# Patient Record
Sex: Female | Born: 1980 | Race: White | Hispanic: No | Smoking: Never smoker
Health system: Southern US, Community
[De-identification: ages and names within clinical notes are randomized; demographics above are authoritative.]

## PROBLEM LIST (undated history)

## (undated) DIAGNOSIS — K52831 Collagenous colitis: Secondary | ICD-10-CM

## (undated) DIAGNOSIS — K589 Irritable bowel syndrome without diarrhea: Secondary | ICD-10-CM

## (undated) DIAGNOSIS — F32A Depression, unspecified: Secondary | ICD-10-CM

## (undated) DIAGNOSIS — A389 Scarlet fever, uncomplicated: Secondary | ICD-10-CM

## (undated) DIAGNOSIS — G039 Meningitis, unspecified: Secondary | ICD-10-CM

## (undated) DIAGNOSIS — B029 Zoster without complications: Secondary | ICD-10-CM

## (undated) DIAGNOSIS — E039 Hypothyroidism, unspecified: Secondary | ICD-10-CM

## (undated) DIAGNOSIS — J309 Allergic rhinitis, unspecified: Secondary | ICD-10-CM

## (undated) DIAGNOSIS — K529 Noninfective gastroenteritis and colitis, unspecified: Secondary | ICD-10-CM

## (undated) DIAGNOSIS — N200 Calculus of kidney: Secondary | ICD-10-CM

## (undated) DIAGNOSIS — R011 Cardiac murmur, unspecified: Secondary | ICD-10-CM

## (undated) DIAGNOSIS — J45909 Unspecified asthma, uncomplicated: Secondary | ICD-10-CM

## (undated) DIAGNOSIS — F329 Major depressive disorder, single episode, unspecified: Secondary | ICD-10-CM

## (undated) DIAGNOSIS — F419 Anxiety disorder, unspecified: Secondary | ICD-10-CM

## (undated) HISTORY — PX: WISDOM TOOTH EXTRACTION: SHX21

## (undated) HISTORY — DX: Meningitis, unspecified: G03.9

## (undated) HISTORY — PX: LITHOTRIPSY: SUR834

## (undated) HISTORY — DX: Unspecified asthma, uncomplicated: J45.909

## (undated) HISTORY — DX: Allergic rhinitis, unspecified: J30.9

## (undated) HISTORY — DX: Hypothyroidism, unspecified: E03.9

## (undated) HISTORY — DX: Cardiac murmur, unspecified: R01.1

## (undated) HISTORY — DX: Calculus of kidney: N20.0

## (undated) HISTORY — DX: Irritable bowel syndrome, unspecified: K58.9

## (undated) HISTORY — DX: Zoster without complications: B02.9

## (undated) HISTORY — DX: Major depressive disorder, single episode, unspecified: F32.9

## (undated) HISTORY — PX: TONSILLECTOMY: SUR1361

## (undated) HISTORY — DX: Depression, unspecified: F32.A

## (undated) HISTORY — DX: Anxiety disorder, unspecified: F41.9

## (undated) HISTORY — DX: Collagenous colitis: K52.831

## (undated) HISTORY — DX: Scarlet fever, uncomplicated: A38.9

## (undated) HISTORY — PX: COLONOSCOPY: SHX174

---

## 2002-04-30 ENCOUNTER — Ambulatory Visit (HOSPITAL_COMMUNITY): Admission: RE | Admit: 2002-04-30 | Discharge: 2002-04-30 | Payer: Self-pay | Admitting: Obstetrics and Gynecology

## 2002-04-30 ENCOUNTER — Encounter: Payer: Self-pay | Admitting: Obstetrics and Gynecology

## 2002-05-26 ENCOUNTER — Encounter: Payer: Self-pay | Admitting: Obstetrics and Gynecology

## 2002-05-26 ENCOUNTER — Ambulatory Visit (HOSPITAL_COMMUNITY): Admission: RE | Admit: 2002-05-26 | Discharge: 2002-05-26 | Payer: Self-pay | Admitting: Obstetrics and Gynecology

## 2002-08-02 ENCOUNTER — Inpatient Hospital Stay (HOSPITAL_COMMUNITY): Admission: AD | Admit: 2002-08-02 | Discharge: 2002-08-02 | Payer: Self-pay | Admitting: Obstetrics and Gynecology

## 2002-08-26 ENCOUNTER — Observation Stay (HOSPITAL_COMMUNITY): Admission: AD | Admit: 2002-08-26 | Discharge: 2002-08-27 | Payer: Self-pay | Admitting: Obstetrics and Gynecology

## 2002-08-27 ENCOUNTER — Encounter: Payer: Self-pay | Admitting: Obstetrics and Gynecology

## 2002-09-22 ENCOUNTER — Inpatient Hospital Stay (HOSPITAL_COMMUNITY): Admission: AD | Admit: 2002-09-22 | Discharge: 2002-09-22 | Payer: Self-pay | Admitting: Obstetrics and Gynecology

## 2002-09-26 ENCOUNTER — Inpatient Hospital Stay (HOSPITAL_COMMUNITY): Admission: AD | Admit: 2002-09-26 | Discharge: 2002-09-28 | Payer: Self-pay | Admitting: Obstetrics and Gynecology

## 2004-11-08 ENCOUNTER — Other Ambulatory Visit: Admission: RE | Admit: 2004-11-08 | Discharge: 2004-11-08 | Payer: Self-pay | Admitting: Obstetrics and Gynecology

## 2005-03-21 ENCOUNTER — Ambulatory Visit: Payer: Self-pay | Admitting: Gastroenterology

## 2005-09-08 ENCOUNTER — Inpatient Hospital Stay: Payer: Self-pay | Admitting: Internal Medicine

## 2005-09-12 ENCOUNTER — Emergency Department: Payer: Self-pay | Admitting: Emergency Medicine

## 2006-01-16 ENCOUNTER — Other Ambulatory Visit: Admission: RE | Admit: 2006-01-16 | Discharge: 2006-01-16 | Payer: Self-pay | Admitting: Obstetrics and Gynecology

## 2006-10-23 ENCOUNTER — Ambulatory Visit: Payer: Self-pay | Admitting: Gastroenterology

## 2006-10-25 ENCOUNTER — Ambulatory Visit: Payer: Self-pay | Admitting: Gastroenterology

## 2007-12-09 ENCOUNTER — Emergency Department: Payer: Self-pay | Admitting: Emergency Medicine

## 2007-12-10 ENCOUNTER — Ambulatory Visit: Payer: Self-pay | Admitting: Emergency Medicine

## 2008-02-01 ENCOUNTER — Ambulatory Visit: Payer: Self-pay | Admitting: Internal Medicine

## 2008-08-05 ENCOUNTER — Ambulatory Visit: Payer: Self-pay | Admitting: Urology

## 2009-06-23 ENCOUNTER — Ambulatory Visit: Payer: Self-pay | Admitting: Urology

## 2009-06-24 ENCOUNTER — Ambulatory Visit: Payer: Self-pay | Admitting: Urology

## 2009-06-26 ENCOUNTER — Emergency Department: Payer: Self-pay | Admitting: Emergency Medicine

## 2010-08-30 ENCOUNTER — Emergency Department: Payer: Self-pay | Admitting: Emergency Medicine

## 2010-09-19 ENCOUNTER — Ambulatory Visit (HOSPITAL_COMMUNITY): Admission: RE | Admit: 2010-09-19 | Discharge: 2010-09-19 | Payer: Self-pay | Admitting: Urology

## 2011-01-24 LAB — PREGNANCY, URINE: Preg Test, Ur: NEGATIVE

## 2011-03-31 NOTE — Discharge Summary (Signed)
NAMETASHYRA, ADDUCI                         ACCOUNT NO.:  [id number]   MEDICAL RECORD NO.:  [id number]                   PATIENT TYPE:  INP   LOCATION:  9112                                 FACILITY:  [facility]   PHYSICIAN:  Malachi Pro. Ambrose Mantle, M.D.              DATE OF BIRTH:  1981-10-23   DATE OF ADMISSION:  09/26/2002  DATE OF DISCHARGE:  09/28/2002                                 DISCHARGE SUMMARY   HISTORY OF PRESENT ILLNESS:  A 30 year old white married female, para 0-0-2-  0, gravida 3, last period possibly December 19, 2001, [facility] September 22, 2002,  by ultrasound, admitted for induction of labor.  Blood group and type A  positive with a negative antibody, nonreactive serology, rubella immune,  hepatitis B surface antigen negative, HIV negative, GC and Chlamydia  negative.  Triple screen normal.  Group B strep negative.  One hour Glucola  124.   Prenatal course began in [place]. Multiple first trimester ultrasounds  for spotting were done.  Followup ultrasound showed normal growth.  The  patient had another episode of vaginal bleeding on August 02, 2002.  At  36 weeks, the patient was admitted with a kidney stone.  On September 22, 2002, the patient claimed she was miserable so was scheduled for induction  of labor.   The patient had ultrasounds at seven weeks and nine weeks that gave a due  date of September 22, 2002.   ALLERGIES:  SULFA.   PAST MEDICAL HISTORY:  Asthma.   PAST SURGICAL HISTORY:  T&A.  Early abortions 1997 and 1999.   FAMILY HISTORY:  Negative.   SOCIAL HISTORY:  Alcohol, tobacco and drugs none.   PHYSICAL EXAMINATION:  Physical exam on admission revealed normal vital  signs.  The abdomen was soft and nontender.  Fundal height had been 38 cm on  September 22, 2002.  Fetal heart tones were normal.  There were no  decelerations.  Cervix was fingertip, 60%, vertex at a -2.   HOSPITAL COURSE:  The patient was placed on Pitocin.  By 1:28 p.m. the  cervix was 4 cm, 80-90% and vertex at a -2.  Fetal heart rate 100-105.  There were no significant declarations.  Fetal heart rate increased to 130  with scalp stimulation.  The patient received an epidural and by 5:55 p.m.  the cervix was 9+ cm, vertex at a 0 station.  She became fully dilated at 7  p.m. She gradually brought the vertex to close to a +3 station in a ROT to  ROA position by 9 p.m.  After two hours and 40 minutes of pushing and vertex  ROT to ROA an epidural boost was given.  Vertex was manually rotated to ROA  and forceps were applied by Dr. Ambrose Mantle.  A living female infant, 7 pounds 5  ounces with Apgars of 7 at one at 8 at five minutes was  delivered ROA over a  left medial and lateral episiotomy.  There was one loop of nuchal cord.  Mild shoulder dystocia was managed by McRoberts maneuver, wood screw  maneuver and suprapubic pressure.  The placenta was intact.  Uterus normal.  Rectal negative.  Left medial lateral episiotomy was repaired with 2-0 and 3-  0 Vicryl.  Blood loss about 500 cc.  Postpartum the patient did well and was  discharged on the second postpartum day.   LABORATORY DATA:  Hemoglobin on admission was 10.9, hematocrit 31.2, white  count 11,300, platelet count 184,000.  Followup hemoglobin 8.6, hematocrit  25.3, white count 12,700.  Platelet count 175,000.  The indices were normal.  RPR was nonreactive.   FINAL DIAGNOSES:  1. Intrauterine pregnancy at 40+ weeks, delivered right occipitoanterior.  2. Prolonged second stage of labor.   OPERATION:  Low forceps delivery ROA, left medial lateral episiotomy and  repair.   FINAL CONDITION:  Improved.   DISCHARGE INSTRUCTIONS:  Instructions include our regular discharge  instruction booklet.  The patient is asked to return to the office in six  weeks for followup examination.   DISCHARGE MEDICATIONS:  Motrin 800 mg 20 tablets, one every eight hours as  needed for pain was given at discharge.                                                Malachi Pro. Ambrose Mantle, M.D.    TFH/MEDQ  D:  09/28/2002  T:  09/28/2002  Job:  [id number]

## 2011-06-15 ENCOUNTER — Emergency Department: Payer: Self-pay | Admitting: Internal Medicine

## 2012-04-26 ENCOUNTER — Ambulatory Visit: Payer: Self-pay

## 2012-06-27 ENCOUNTER — Ambulatory Visit: Payer: Self-pay | Admitting: Internal Medicine

## 2012-09-27 ENCOUNTER — Ambulatory Visit: Payer: Self-pay | Admitting: Internal Medicine

## 2013-09-22 ENCOUNTER — Ambulatory Visit: Payer: Self-pay

## 2014-01-12 ENCOUNTER — Ambulatory Visit: Payer: Self-pay | Admitting: Physician Assistant

## 2014-02-25 ENCOUNTER — Ambulatory Visit: Payer: Self-pay | Admitting: Family Medicine

## 2014-02-25 LAB — HCG, QUANTITATIVE, PREGNANCY

## 2014-09-01 ENCOUNTER — Ambulatory Visit: Payer: Self-pay

## 2014-09-01 LAB — COMPREHENSIVE METABOLIC PANEL
ALBUMIN: 4 g/dL (ref 3.4–5.0)
ALT: 19 U/L
Alkaline Phosphatase: 58 U/L
Anion Gap: 10 (ref 7–16)
BILIRUBIN TOTAL: 0.7 mg/dL (ref 0.2–1.0)
BUN: 9 mg/dL (ref 7–18)
CHLORIDE: 102 mmol/L (ref 98–107)
Calcium, Total: 8.7 mg/dL (ref 8.5–10.1)
Co2: 28 mmol/L (ref 21–32)
Creatinine: 0.79 mg/dL (ref 0.60–1.30)
EGFR (African American): >60
EGFR (Non-African Amer.): >60
Glucose: 115 mg/dL — ABNORMAL HIGH (ref 65–99)
Osmolality: 279 (ref 275–301)
Potassium: 3.4 mmol/L — ABNORMAL LOW (ref 3.5–5.1)
SGOT(AST): 16 U/L (ref 15–37)
Sodium: 140 mmol/L (ref 136–145)
TOTAL PROTEIN: 7.3 g/dL (ref 6.4–8.2)

## 2014-09-01 LAB — LIPASE, BLOOD: Lipase: 125 U/L (ref 73–393)

## 2014-09-01 LAB — AMYLASE: AMYLASE: 35 U/L (ref 25–115)

## 2014-09-01 LAB — PREGNANCY, URINE: Pregnancy Test, Urine: NEGATIVE m[IU]/mL

## 2014-09-01 LAB — CBC WITH DIFFERENTIAL/PLATELET
Basophil #: 0 10*3/uL (ref 0.0–0.1)
Basophil %: 0.6 %
Eosinophil #: 0.2 10*3/uL (ref 0.0–0.7)
Eosinophil %: 2.8 %
HCT: 37.8 % (ref 35.0–47.0)
HGB: 12.7 g/dL (ref 12.0–16.0)
Lymphocyte #: 1.3 10*3/uL (ref 1.0–3.6)
Lymphocyte %: 20.1 %
MCH: 30.8 pg (ref 26.0–34.0)
MCHC: 33.7 g/dL (ref 32.0–36.0)
MCV: 91 fL (ref 80–100)
Monocyte #: 0.5 x10 3/mm (ref 0.2–0.9)
Monocyte %: 7.4 %
Neutrophil #: 4.4 10*3/uL (ref 1.4–6.5)
Neutrophil %: 69.1 %
Platelet: 237 10*3/uL (ref 150–440)
RBC: 4.14 10*6/uL (ref 3.80–5.20)
RDW: 12.8 % (ref 11.5–14.5)
WBC: 6.4 10*3/uL (ref 3.6–11.0)

## 2014-09-01 LAB — URINALYSIS, COMPLETE
Blood: NEGATIVE
Glucose,UR: NEGATIVE
Ketone: NEGATIVE
Leukocyte Esterase: NEGATIVE
Nitrite: NEGATIVE
Ph: 5.5 (ref 5.0–8.0)
Protein: 30
Specific Gravity: >=1.03 (ref 1.000–1.030)

## 2014-09-03 LAB — URINE CULTURE

## 2014-11-18 ENCOUNTER — Encounter: Payer: Self-pay | Admitting: Internal Medicine

## 2014-12-07 ENCOUNTER — Ambulatory Visit (INDEPENDENT_AMBULATORY_CARE_PROVIDER_SITE_OTHER): Payer: PRIVATE HEALTH INSURANCE | Admitting: Obstetrics & Gynecology

## 2014-12-07 ENCOUNTER — Encounter: Payer: Self-pay | Admitting: Obstetrics & Gynecology

## 2014-12-07 VITALS — BP 116/76 | HR 76 | Ht 62.0 in | Wt 154.0 lb

## 2014-12-07 DIAGNOSIS — Z3009 Encounter for other general counseling and advice on contraception: Secondary | ICD-10-CM

## 2014-12-07 DIAGNOSIS — Z124 Encounter for screening for malignant neoplasm of cervix: Secondary | ICD-10-CM

## 2014-12-07 DIAGNOSIS — Z1151 Encounter for screening for human papillomavirus (HPV): Secondary | ICD-10-CM

## 2014-12-07 DIAGNOSIS — Z01419 Encounter for gynecological examination (general) (routine) without abnormal findings: Secondary | ICD-10-CM

## 2014-12-07 DIAGNOSIS — Z30432 Encounter for removal of intrauterine contraceptive device: Secondary | ICD-10-CM

## 2014-12-07 NOTE — Patient Instructions (Addendum)
RE: MyChart  Dear Ms. Palecek  We are excited to introduce MyChart, a new best-in-class service that provides you online access to important information in your electronic medical record. We want to make it easier for you to view your health information - all in one secure location - when and where you need it. We expect MyChart will enhance the quality of care and service we provide. Use the activation code below to enroll in MyChart online at [email or web address]  When you register for MyChart, you can:  Marland Kitchen View your test results. . Communicate securely with your physician's office.  . View your medical history, allergies, medications, and immunizations. . Conveniently print information such as your medication lists.  If you are age 40 or older and want a member of your family to have access to your record, you must provide written consent by completing a proxy form available at our facility. Please speak to our clinical staff about guidelines regarding accounts for patients younger than age 58.  As you activate your MyChart account and need any technical assistance, please call the MyChart technical support line at [phone]) or email your question to [email or web address]. If you email your question(s), please include your name, a return phone number and the best time to reach you.  Thank you for using MyChart as your new health and wellness resource!  MyChart Activation Code:  [email or web address] Expires: 02/05/2015  3:52 PM     Preventive Care for Adults A healthy lifestyle and preventive care can promote health and wellness. Preventive health guidelines for women include the following key practices.  A routine yearly physical is a good way to check with your health care provider about your health and preventive screening. It is a chance to share any concerns and updates on your health and to receive a thorough exam.  Visit your dentist for a routine  exam and preventive care every 6 months. Brush your teeth twice a day and floss once a day. Good oral hygiene prevents tooth decay and gum disease.  The frequency of eye exams is based on your age, health, family medical history, use of contact lenses, and other factors. Follow your health care provider's recommendations for frequency of eye exams.  Eat a healthy diet. Foods like vegetables, fruits, whole grains, low-fat dairy products, and lean protein foods contain the nutrients you need without too many calories. Decrease your intake of foods high in solid fats, added sugars, and salt. Eat the right amount of calories for you.Get information about a proper diet from your health care provider, if necessary.  Regular physical exercise is one of the most important things you can do for your health. Most adults should get at least 150 minutes of moderate-intensity exercise (any activity that increases your heart rate and causes you to sweat) each week. In addition, most adults need muscle-strengthening exercises on 2 or more days a week.  Maintain a healthy weight. The body mass index (BMI) is a screening tool to identify possible weight problems. It provides an estimate of body fat based on height and weight. Your health care provider can find your BMI and can help you achieve or maintain a healthy weight.For adults 20 years and older:  A BMI below 18.5 is considered underweight.  A BMI of 18.5 to 24.9 is normal.  A BMI of 25 to 29.9 is considered overweight.  A BMI of 30 and above is considered obese.  Maintain normal blood lipids and cholesterol levels by  exercising and minimizing your intake of saturated fat. Eat a balanced diet with plenty of fruit and vegetables. Blood tests for lipids and cholesterol should begin at age 24 and be repeated every 5 years. If your lipid or cholesterol levels are high, you are over 50, or you are at high risk for heart disease, you may need your cholesterol  levels checked more frequently.Ongoing high lipid and cholesterol levels should be treated with medicines if diet and exercise are not working.  If you smoke, find out from your health care provider how to quit. If you do not use tobacco, do not start.  Lung cancer screening is recommended for adults aged 54-80 years who are at high risk for developing lung cancer because of a history of smoking. A yearly low-dose CT scan of the lungs is recommended for people who have at least a 30-pack-year history of smoking and are a current smoker or have quit within the past 15 years. A pack year of smoking is smoking an average of 1 pack of cigarettes a day for 1 year (for example: 1 pack a day for 30 years or 2 packs a day for 15 years). Yearly screening should continue until the smoker has stopped smoking for at least 15 years. Yearly screening should be stopped for people who develop a health problem that would prevent them from having lung cancer treatment.  If you are pregnant, do not drink alcohol. If you are breastfeeding, be very cautious about drinking alcohol. If you are not pregnant and choose to drink alcohol, do not have more than 1 drink per day. One drink is considered to be 12 ounces (355 mL) of beer, 5 ounces (148 mL) of wine, or 1.5 ounces (44 mL) of liquor.  Avoid use of street drugs. Do not share needles with anyone. Ask for help if you need support or instructions about stopping the use of drugs.  High blood pressure causes heart disease and increases the risk of stroke. Your blood pressure should be checked at least every 1 to 2 years. Ongoing high blood pressure should be treated with medicines if weight loss and exercise do not work.  If you are 5-16 years old, ask your health care provider if you should take aspirin to prevent strokes.  Diabetes screening involves taking a blood sample to check your fasting blood sugar level. This should be done once every 3 years, after age 59, if you  are within normal weight and without risk factors for diabetes. Testing should be considered at a younger age or be carried out more frequently if you are overweight and have at least 1 risk factor for diabetes.  Breast cancer screening is essential preventive care for women. You should practice "breast self-awareness." This means understanding the normal appearance and feel of your breasts and may include breast self-examination. Any changes detected, no matter how small, should be reported to a health care provider. Women in their 76s and 30s should have a clinical breast exam (CBE) by a health care provider as part of a regular health exam every 1 to 3 years. After age 32, women should have a CBE every year. Starting at age 3, women should consider having a mammogram (breast X-ray test) every year. Women who have a family history of breast cancer should talk to their health care provider about genetic screening. Women at a high risk of breast cancer should talk to their health care providers about having an MRI and a mammogram every  year.  Breast cancer gene (BRCA)-related cancer risk assessment is recommended for women who have family members with BRCA-related cancers. BRCA-related cancers include breast, ovarian, tubal, and peritoneal cancers. Having family members with these cancers may be associated with an increased risk for harmful changes (mutations) in the breast cancer genes BRCA1 and BRCA2. Results of the assessment will determine the need for genetic counseling and BRCA1 and BRCA2 testing.  Routine pelvic exams to screen for cancer are no longer recommended for nonpregnant women who are considered low risk for cancer of the pelvic organs (ovaries, uterus, and vagina) and who do not have symptoms. Ask your health care provider if a screening pelvic exam is right for you.  If you have had past treatment for cervical cancer or a condition that could lead to cancer, you need Pap tests and  screening for cancer for at least 20 years after your treatment. If Pap tests have been discontinued, your risk factors (such as having a new sexual partner) need to be reassessed to determine if screening should be resumed. Some women have medical problems that increase the chance of getting cervical cancer. In these cases, your health care provider may recommend more frequent screening and Pap tests.  The HPV test is an additional test that may be used for cervical cancer screening. The HPV test looks for the virus that can cause the cell changes on the cervix. The cells collected during the Pap test can be tested for HPV. The HPV test could be used to screen women aged 67 years and older, and should be used in women of any age who have unclear Pap test results. After the age of 36, women should have HPV testing at the same frequency as a Pap test.  Colorectal cancer can be detected and often prevented. Most routine colorectal cancer screening begins at the age of 26 years and continues through age 2 years. However, your health care provider may recommend screening at an earlier age if you have risk factors for colon cancer. On a yearly basis, your health care provider may provide home test kits to check for hidden blood in the stool. Use of a small camera at the end of a tube, to directly examine the colon (sigmoidoscopy or colonoscopy), can detect the earliest forms of colorectal cancer. Talk to your health care provider about this at age 13, when routine screening begins. Direct exam of the colon should be repeated every 5-10 years through age 63 years, unless early forms of pre-cancerous polyps or small growths are found.  People who are at an increased risk for hepatitis B should be screened for this virus. You are considered at high risk for hepatitis B if:  You were born in a country where hepatitis B occurs often. Talk with your health care provider about which countries are considered high  risk.  Your parents were born in a high-risk country and you have not received a shot to protect against hepatitis B (hepatitis B vaccine).  You have HIV or AIDS.  You use needles to inject street drugs.  You live with, or have sex with, someone who has hepatitis B.  You get hemodialysis treatment.  You take certain medicines for conditions like cancer, organ transplantation, and autoimmune conditions.  Hepatitis C blood testing is recommended for all people born from [birth year] through 1965 and any individual with known risks for hepatitis C.  Practice safe sex. Use condoms and avoid high-risk sexual practices to reduce the spread of  sexually transmitted infections (STIs). STIs include gonorrhea, chlamydia, syphilis, trichomonas, herpes, HPV, and human immunodeficiency virus (HIV). Herpes, HIV, and HPV are viral illnesses that have no cure. They can result in disability, cancer, and death.  You should be screened for sexually transmitted illnesses (STIs) including gonorrhea and chlamydia if:  You are sexually active and are younger than 24 years.  You are older than 24 years and your health care provider tells you that you are at risk for this type of infection.  Your sexual activity has changed since you were last screened and you are at an increased risk for chlamydia or gonorrhea. Ask your health care provider if you are at risk.  If you are at risk of being infected with HIV, it is recommended that you take a prescription medicine daily to prevent HIV infection. This is called preexposure prophylaxis (PrEP). You are considered at risk if:  You are a heterosexual woman, are sexually active, and are at increased risk for HIV infection.  You take drugs by injection.  You are sexually active with a partner who has HIV.  Talk with your health care provider about whether you are at high risk of being infected with HIV. If you choose to begin PrEP, you should first be tested for HIV. You  should then be tested every 3 months for as long as you are taking PrEP.  Osteoporosis is a disease in which the bones lose minerals and strength with aging. This can result in serious bone fractures or breaks. The risk of osteoporosis can be identified using a bone density scan. Women ages 65 years and over and women at risk for fractures or osteoporosis should discuss screening with their health care providers. Ask your health care provider whether you should take a calcium supplement or vitamin D to reduce the rate of osteoporosis.  Menopause can be associated with physical symptoms and risks. Hormone replacement therapy is available to decrease symptoms and risks. You should talk to your health care provider about whether hormone replacement therapy is right for you.  Use sunscreen. Apply sunscreen liberally and repeatedly throughout the day. You should seek shade when your shadow is shorter than you. Protect yourself by wearing long sleeves, pants, a wide-brimmed hat, and sunglasses year round, whenever you are outdoors.  Once a month, do a whole body skin exam, using a mirror to look at the skin on your back. Tell your health care provider of new moles, moles that have irregular borders, moles that are larger than a pencil eraser, or moles that have changed in shape or color.  Stay current with required vaccines (immunizations).  Influenza vaccine. All adults should be immunized every year.  Tetanus, diphtheria, and acellular pertussis (Td, Tdap) vaccine. Pregnant women should receive 1 dose of Tdap vaccine during each pregnancy. The dose should be obtained regardless of the length of time since the last dose. Immunization is preferred during the 27th-36th week of gestation. An adult who has not previously received Tdap or who does not know her vaccine status should receive 1 dose of Tdap. This initial dose should be followed by tetanus and diphtheria toxoids (Td) booster doses every 10 years.  Adults with an unknown or incomplete history of completing a 3-dose immunization series with Td-containing vaccines should begin or complete a primary immunization series including a Tdap dose. Adults should receive a Td booster every 10 years.  Varicella vaccine. An adult without evidence of immunity to varicella should receive 2 doses or   a second dose if she has previously received 1 dose. Pregnant females who do not have evidence of immunity should receive the first dose after pregnancy. This first dose should be obtained before leaving the health care facility. The second dose should be obtained 4-8 weeks after the first dose.  Human papillomavirus (HPV) vaccine. Females aged 13-26 years who have not received the vaccine previously should obtain the 3-dose series. The vaccine is not recommended for use in pregnant females. However, pregnancy testing is not needed before receiving a dose. If a female is found to be pregnant after receiving a dose, no treatment is needed. In that case, the remaining doses should be delayed until after the pregnancy. Immunization is recommended for any person with an immunocompromised condition through the age of 26 years if she did not get any or all doses earlier. During the 3-dose series, the second dose should be obtained 4-8 weeks after the first dose. The third dose should be obtained 24 weeks after the first dose and 16 weeks after the second dose.  Zoster vaccine. One dose is recommended for adults aged 60 years or older unless certain conditions are present.  Measles, mumps, and rubella (MMR) vaccine. Adults born before 1957 generally are considered immune to measles and mumps. Adults born in 1957 or later should have 1 or more doses of MMR vaccine unless there is a contraindication to the vaccine or there is laboratory evidence of immunity to each of the three diseases. A routine second dose of MMR vaccine should be obtained at least 28 days after the first dose  for students attending postsecondary schools, health care workers, or international travelers. People who received inactivated measles vaccine or an unknown type of measles vaccine during 1963-1967 should receive 2 doses of MMR vaccine. People who received inactivated mumps vaccine or an unknown type of mumps vaccine before 1979 and are at high risk for mumps infection should consider immunization with 2 doses of MMR vaccine. For females of childbearing age, rubella immunity should be determined. If there is no evidence of immunity, females who are not pregnant should be vaccinated. If there is no evidence of immunity, females who are pregnant should delay immunization until after pregnancy. Unvaccinated health care workers born before 1957 who lack laboratory evidence of measles, mumps, or rubella immunity or laboratory confirmation of disease should consider measles and mumps immunization with 2 doses of MMR vaccine or rubella immunization with 1 dose of MMR vaccine.  Pneumococcal 13-valent conjugate (PCV13) vaccine. When indicated, a person who is uncertain of her immunization history and has no record of immunization should receive the PCV13 vaccine. An adult aged 19 years or older who has certain medical conditions and has not been previously immunized should receive 1 dose of PCV13 vaccine. This PCV13 should be followed with a dose of pneumococcal polysaccharide (PPSV23) vaccine. The PPSV23 vaccine dose should be obtained at least 8 weeks after the dose of PCV13 vaccine. An adult aged 19 years or older who has certain medical conditions and previously received 1 or more doses of PPSV23 vaccine should receive 1 dose of PCV13. The PCV13 vaccine dose should be obtained 1 or more years after the last PPSV23 vaccine dose.  Pneumococcal polysaccharide (PPSV23) vaccine. When PCV13 is also indicated, PCV13 should be obtained first. All adults aged 65 years and older should be immunized. An adult younger than age  65 years who has certain medical conditions should be immunized. Any person who resides in a nursing   home or long-term care facility should be immunized. An adult smoker should be immunized. People with an immunocompromised condition and certain other conditions should receive both PCV13 and PPSV23 vaccines. People with human immunodeficiency virus (HIV) infection should be immunized as soon as possible after diagnosis. Immunization during chemotherapy or radiation therapy should be avoided. Routine use of PPSV23 vaccine is not recommended for American Indians, [place] Natives, or people younger than 65 years unless there are medical conditions that require PPSV23 vaccine. When indicated, people who have unknown immunization and have no record of immunization should receive PPSV23 vaccine. One-time revaccination 5 years after the first dose of PPSV23 is recommended for people aged 19-64 years who have chronic kidney failure, nephrotic syndrome, asplenia, or immunocompromised conditions. People who received 1-2 doses of PPSV23 before age 21 years should receive another dose of PPSV23 vaccine at age 69 years or later if at least 5 years have passed since the previous dose. Doses of PPSV23 are not needed for people immunized with PPSV23 at or after age 57 years.  Meningococcal vaccine. Adults with asplenia or persistent complement component deficiencies should receive 2 doses of quadrivalent meningococcal conjugate (MenACWY-D) vaccine. The doses should be obtained at least 2 months apart. Microbiologists working with certain meningococcal bacteria, [place] recruits, people at risk during an outbreak, and people who travel to or live in countries with a high rate of meningitis should be immunized. A first-year college student up through age 18 years who is living in a residence hall should receive a dose if she did not receive a dose on or after her 16th birthday. Adults who have certain high-risk conditions should  receive one or more doses of vaccine.  Hepatitis A vaccine. Adults who wish to be protected from this disease, have certain high-risk conditions, work with hepatitis A-infected animals, work in hepatitis A research labs, or travel to or work in countries with a high rate of hepatitis A should be immunized. Adults who were previously unvaccinated and who anticipate close contact with an international adoptee during the first 60 days after arrival in the [place] from a country with a high rate of hepatitis A should be immunized.  Hepatitis B vaccine. Adults who wish to be protected from this disease, have certain high-risk conditions, may be exposed to blood or other infectious body fluids, are household contacts or sex partners of hepatitis B positive people, are clients or workers in certain care facilities, or travel to or work in countries with a high rate of hepatitis B should be immunized.  Haemophilus influenzae type b (Hib) vaccine. A previously unvaccinated person with asplenia or sickle cell disease or having a scheduled splenectomy should receive 1 dose of Hib vaccine. Regardless of previous immunization, a recipient of a hematopoietic stem cell transplant should receive a 3-dose series 6-12 months after her successful transplant. Hib vaccine is not recommended for adults with HIV infection. Preventive Services / Frequency Ages 82 to 53 years  Blood pressure check.** / Every 1 to 2 years.  Lipid and cholesterol check.** / Every 5 years beginning at age 81.  Clinical breast exam.** / Every 3 years for women in their 57s and 72s.  BRCA-related cancer risk assessment.** / For women who have family members with a BRCA-related cancer (breast, ovarian, tubal, or peritoneal cancers).  Pap test.** / Every 2 years from ages 28 through 71. Every 3 years starting at age 75 through age 86 or 52 with a history of 3 consecutive normal Pap  tests.  HPV screening.** / Every 3 years from ages 30  through ages 65 to 70 with a history of 3 consecutive normal Pap tests.  Hepatitis C blood test.** / For any individual with known risks for hepatitis C.  Skin self-exam. / Monthly.  Influenza vaccine. / Every year.  Tetanus, diphtheria, and acellular pertussis (Tdap, Td) vaccine.** / Consult your health care provider. Pregnant women should receive 1 dose of Tdap vaccine during each pregnancy. 1 dose of Td every 10 years.  Varicella vaccine.** / Consult your health care provider. Pregnant females who do not have evidence of immunity should receive the first dose after pregnancy.  HPV vaccine. / 3 doses over 6 months, if 26 and younger. The vaccine is not recommended for use in pregnant females. However, pregnancy testing is not needed before receiving a dose.  Measles, mumps, rubella (MMR) vaccine.** / You need at least 1 dose of MMR if you were born in 1957 or later. You may also need a 2nd dose. For females of childbearing age, rubella immunity should be determined. If there is no evidence of immunity, females who are not pregnant should be vaccinated. If there is no evidence of immunity, females who are pregnant should delay immunization until after pregnancy.  Pneumococcal 13-valent conjugate (PCV13) vaccine.** / Consult your health care provider.  Pneumococcal polysaccharide (PPSV23) vaccine.** / 1 to 2 doses if you smoke cigarettes or if you have certain conditions.  Meningococcal vaccine.** / 1 dose if you are age 19 to 21 years and a first-year college student living in a residence hall, or have one of several medical conditions, you need to get vaccinated against meningococcal disease. You may also need additional booster doses.  Hepatitis A vaccine.** / Consult your health care provider.  Hepatitis B vaccine.** / Consult your health care provider.  Haemophilus influenzae type b (Hib) vaccine.** / Consult your health care provider. Ages 40 to 64 years  Blood pressure check.** /  Every 1 to 2 years.  Lipid and cholesterol check.** / Every 5 years beginning at age 20 years.  Lung cancer screening. / Every year if you are aged 55-80 years and have a 30-pack-year history of smoking and currently smoke or have quit within the past 15 years. Yearly screening is stopped once you have quit smoking for at least 15 years or develop a health problem that would prevent you from having lung cancer treatment.  Clinical breast exam.** / Every year after age 40 years.  BRCA-related cancer risk assessment.** / For women who have family members with a BRCA-related cancer (breast, ovarian, tubal, or peritoneal cancers).  Mammogram.** / Every year beginning at age 40 years and continuing for as long as you are in good health. Consult with your health care provider.  Pap test.** / Every 3 years starting at age 30 years through age 65 or 70 years with a history of 3 consecutive normal Pap tests.  HPV screening.** / Every 3 years from ages 30 years through ages 65 to 70 years with a history of 3 consecutive normal Pap tests.  Fecal occult blood test (FOBT) of stool. / Every year beginning at age 50 years and continuing until age 75 years. You may not need to do this test if you get a colonoscopy every 10 years.  Flexible sigmoidoscopy or colonoscopy.** / Every 5 years for a flexible sigmoidoscopy or every 10 years for a colonoscopy beginning at age 50 years and continuing until age 75 years.  Hepatitis   C blood test.** / For all people born from [birth year] through 1967 and any individual with known risks for hepatitis C.  Skin self-exam. / Monthly.  Influenza vaccine. / Every year.  Tetanus, diphtheria, and acellular pertussis (Tdap/Td) vaccine.** / Consult your health care provider. Pregnant women should receive 1 dose of Tdap vaccine during each pregnancy. 1 dose of Td every 10 years.  Varicella vaccine.** / Consult your health care provider. Pregnant females who do not have evidence of  immunity should receive the first dose after pregnancy.  Zoster vaccine.** / 1 dose for adults aged 51 years or older.  Measles, mumps, rubella (MMR) vaccine.** / You need at least 1 dose of MMR if you were born in 1957 or later. You may also need a 2nd dose. For females of childbearing age, rubella immunity should be determined. If there is no evidence of immunity, females who are not pregnant should be vaccinated. If there is no evidence of immunity, females who are pregnant should delay immunization until after pregnancy.  Pneumococcal 13-valent conjugate (PCV13) vaccine.** / Consult your health care provider.  Pneumococcal polysaccharide (PPSV23) vaccine.** / 1 to 2 doses if you smoke cigarettes or if you have certain conditions.  Meningococcal vaccine.** / Consult your health care provider.  Hepatitis A vaccine.** / Consult your health care provider.  Hepatitis B vaccine.** / Consult your health care provider.  Haemophilus influenzae type b (Hib) vaccine.** / Consult your health care provider. Ages 56 years and over  Blood pressure check.** / Every 1 to 2 years.  Lipid and cholesterol check.** / Every 5 years beginning at age 40 years.  Lung cancer screening. / Every year if you are aged 2-80 years and have a 30-pack-year history of smoking and currently smoke or have quit within the past 15 years. Yearly screening is stopped once you have quit smoking for at least 15 years or develop a health problem that would prevent you from having lung cancer treatment.  Clinical breast exam.** / Every year after age 28 years.  BRCA-related cancer risk assessment.** / For women who have family members with a BRCA-related cancer (breast, ovarian, tubal, or peritoneal cancers).  Mammogram.** / Every year beginning at age 62 years and continuing for as long as you are in good health. Consult with your health care provider.  Pap test.** / Every 3 years starting at age 42 years through age 50 or  54 years with 3 consecutive normal Pap tests. Testing can be stopped between 65 and 70 years with 3 consecutive normal Pap tests and no abnormal Pap or HPV tests in the past 10 years.  HPV screening.** / Every 3 years from ages 101 years through ages 26 or 66 years with a history of 3 consecutive normal Pap tests. Testing can be stopped between 65 and 70 years with 3 consecutive normal Pap tests and no abnormal Pap or HPV tests in the past 10 years.  Fecal occult blood test (FOBT) of stool. / Every year beginning at age 5 years and continuing until age 40 years. You may not need to do this test if you get a colonoscopy every 10 years.  Flexible sigmoidoscopy or colonoscopy.** / Every 5 years for a flexible sigmoidoscopy or every 10 years for a colonoscopy beginning at age 21 years and continuing until age 78 years.  Hepatitis C blood test.** / For all people born from [birth year] through 1967 and any individual with known risks for hepatitis C.  Osteoporosis screening.** / A  one-time screening for women ages 71 years and over and women at risk for fractures or osteoporosis.  Skin self-exam. / Monthly.  Influenza vaccine. / Every year.  Tetanus, diphtheria, and acellular pertussis (Tdap/Td) vaccine.** / 1 dose of Td every 10 years.  Varicella vaccine.** / Consult your health care provider.  Zoster vaccine.** / 1 dose for adults aged 26 years or older.  Pneumococcal 13-valent conjugate (PCV13) vaccine.** / Consult your health care provider.  Pneumococcal polysaccharide (PPSV23) vaccine.** / 1 dose for all adults aged 4 years and older.  Meningococcal vaccine.** / Consult your health care provider.  Hepatitis A vaccine.** / Consult your health care provider.  Hepatitis B vaccine.** / Consult your health care provider.  Haemophilus influenzae type b (Hib) vaccine.** / Consult your health care provider. ** Family history and personal history of risk and conditions may change your health care  provider's recommendations. Document Released: 12/26/2001 Document Revised: 03/16/2014 Document Reviewed: 03/27/2011 [facility] Patient Information 2015 [place], [place]. This information is not intended to replace advice given to you by your health care provider. Make sure you discuss any questions you have with your health care provider.

## 2014-12-07 NOTE — Progress Notes (Signed)
    GYNECOLOGY CLINIC ANNUAL PREVENTATIVE CARE ENCOUNTER NOTE  Subjective:     Alison PealsRhonda W Clarke is a 34 y.o. [phone] female here for a routine annual gynecologic exam.  Current complaints: desires removal of her Mirena IUD which has been in place for 5 years.     Gynecologic History No LMP recorded. Patient is not currently having periods (Reason: IUD). Contraception: IUD Last Pap: 2015. Results were: normal  Obstetric History OB History  Gravida Para Term Preterm AB SAB TAB Ectopic Multiple Living  3 1 1  2 1 1   1     # Outcome Date GA Lbr Len/2nd Weight Sex Delivery Anes PTL Lv  3 TAB           2 SAB           1 Term              History   Social History  . Marital Status: Married    Spouse Name: N/A    Number of Children: N/A  . Years of Education: N/A   Occupational History  . Not on file.   Social History Main Topics  . Smoking status: Never Smoker   . Smokeless tobacco: Never Used  . Alcohol Use: 0.0 oz/week    0 Not specified per week     Comment: rare  . Drug Use: No  . Sexual Activity: Not Currently   Other Topics Concern  . Not on file   Social History Narrative  . No narrative on file    The following portions of the patient's history were reviewed and updated as appropriate: allergies, current medications, past family history, past medical history, past social history, past surgical history and problem list.  Review of Systems Pertinent items are noted in HPI.   Objective:   BP 116/76 mmHg  Pulse 76  Ht 5\' 2"  (1.575 m)  Wt 154 lb (69.854 kg)  BMI 28.16 kg/m2 GENERAL: Well-developed, well-nourished female in no acute distress.  HEENT: Normocephalic, atraumatic. Sclerae anicteric.  NECK: Supple. Normal thyroid.  LUNGS: Clear to auscultation bilaterally.  HEART: Regular rate and rhythm. BREASTS: Symmetric in size. No masses, skin changes, nipple drainage, or lymphadenopathy. ABDOMEN: Soft, nontender, nondistended. No organomegaly. PELVIC:  Normal external female genitalia. Vagina is pink and rugated.  Normal discharge. Normal cervix contour. IUD strings visualized.  Pap smear obtained. Uterus is normal in size. No adnexal mass or tenderness.  EXTREMITIES: No cyanosis, clubbing, or edema, 2+ distal pulses.  IUD Removal  Patient identified, informed consent performed, consent signed.  Patient was in the dorsal lithotomy position, normal external genitalia was noted.  A speculum was placed in the patient's vagina, normal discharge was noted, no lesions. The cervix was visualized, no lesions, no abnormal discharge.  The strings of the IUD were grasped and pulled using ring forceps. The IUD was removed in its entirety.  Patient tolerated the procedure well.    Assessment:   Annual gynecologic examination IUD Removal   Plan:   Pap done, will follow up results and manage accordingly. IUD removed today, patient will use OCPs/condoms for contraception; OCPs prescribed today. Routine preventative health maintenance measures emphasized   Jaynie CollinsUGONNA  Gwendolyn Nishi, MD, FACOG Attending Obstetrician & Gynecologist Center for [facility], [facility]

## 2014-12-09 MED ORDER — NORETHIN ACE-ETH ESTRAD-FE 1-20 MG-MCG(24) PO TABS: 1.0000 | ORAL_TABLET | Freq: Every day | ORAL | Status: DC

## 2014-12-09 NOTE — Addendum Note (Signed)
Addended by: Jaynie CollinsANYANWU, Kaoru Benda A on: 12/09/2014 08:57 AM   Modules accepted: Orders

## 2014-12-10 LAB — CYTOLOGY - PAP

## 2014-12-11 ENCOUNTER — Emergency Department: Payer: Self-pay | Admitting: Emergency Medicine

## 2014-12-21 ENCOUNTER — Emergency Department: Payer: Self-pay | Admitting: Emergency Medicine

## 2014-12-21 LAB — CBC WITH DIFFERENTIAL/PLATELET
Basophil #: 0 10*3/uL (ref 0.0–0.1)
Basophil %: 0.4 %
Eosinophil #: 0.2 10*3/uL (ref 0.0–0.7)
Eosinophil %: 3.5 %
HCT: 39.1 % (ref 35.0–47.0)
HGB: 12.7 g/dL (ref 12.0–16.0)
LYMPHS PCT: 17.4 %
Lymphocyte #: 1.1 10*3/uL (ref 1.0–3.6)
MCH: 29.6 pg (ref 26.0–34.0)
MCHC: 32.5 g/dL (ref 32.0–36.0)
MCV: 91 fL (ref 80–100)
MONOS PCT: 7.7 %
Monocyte #: 0.5 x10 3/mm (ref 0.2–0.9)
NEUTROS ABS: 4.5 10*3/uL (ref 1.4–6.5)
Neutrophil %: 71 %
Platelet: 223 10*3/uL (ref 150–440)
RBC: 4.29 10*6/uL (ref 3.80–5.20)
RDW: 13.1 % (ref 11.5–14.5)
WBC: 6.4 10*3/uL (ref 3.6–11.0)

## 2014-12-21 LAB — COMPREHENSIVE METABOLIC PANEL
ANION GAP: 4 — AB (ref 7–16)
Albumin: 3.8 g/dL (ref 3.4–5.0)
Alkaline Phosphatase: 54 U/L (ref 46–116)
BILIRUBIN TOTAL: 0.6 mg/dL (ref 0.2–1.0)
BUN: 10 mg/dL (ref 7–18)
CHLORIDE: 106 mmol/L (ref 98–107)
Calcium, Total: 8.8 mg/dL (ref 8.5–10.1)
Co2: 28 mmol/L (ref 21–32)
Creatinine: 0.66 mg/dL (ref 0.60–1.30)
GLUCOSE: 86 mg/dL (ref 65–99)
Osmolality: 274 (ref 275–301)
POTASSIUM: 4.3 mmol/L (ref 3.5–5.1)
SGOT(AST): 18 U/L (ref 15–37)
SGPT (ALT): 19 U/L (ref 14–63)
Sodium: 138 mmol/L (ref 136–145)
TOTAL PROTEIN: 7.3 g/dL (ref 6.4–8.2)

## 2014-12-21 LAB — URINALYSIS, COMPLETE
Bilirubin,UR: NEGATIVE
GLUCOSE, UR: NEGATIVE mg/dL (ref 0–75)
Ketone: NEGATIVE
NITRITE: NEGATIVE
Ph: 6 (ref 4.5–8.0)
Protein: NEGATIVE
RBC,UR: 6 /HPF (ref 0–5)
SPECIFIC GRAVITY: 1.01 (ref 1.003–1.030)

## 2014-12-21 LAB — LIPASE, BLOOD: LIPASE: 134 U/L (ref 73–393)

## 2014-12-21 LAB — TROPONIN I: Troponin-I: <0.02 ng/mL

## 2015-01-07 ENCOUNTER — Ambulatory Visit (INDEPENDENT_AMBULATORY_CARE_PROVIDER_SITE_OTHER): Payer: PRIVATE HEALTH INSURANCE | Admitting: Internal Medicine

## 2015-01-07 ENCOUNTER — Encounter: Payer: Self-pay | Admitting: Internal Medicine

## 2015-01-07 ENCOUNTER — Other Ambulatory Visit (INDEPENDENT_AMBULATORY_CARE_PROVIDER_SITE_OTHER): Payer: PRIVATE HEALTH INSURANCE

## 2015-01-07 VITALS — BP 112/60 | HR 68 | Ht 61.0 in | Wt 156.0 lb

## 2015-01-07 DIAGNOSIS — K589 Irritable bowel syndrome without diarrhea: Secondary | ICD-10-CM

## 2015-01-07 LAB — IGA: IgA: 207 mg/dL (ref 68–378)

## 2015-01-07 NOTE — Progress Notes (Addendum)
Subjective:    Patient ID: Blenda Peals, female    DOB: 11-Apr-1981, 34 y.o.   MRN: [id number] CC: diarrhea, abdominal pain HPI This 34 yo married white woman is here for evaluation of abdominal pain and diarrhea. She has a prior diagnosis of IBS and has had a workup including colonoscopy  Around 2008 at [place] [facility]. She says it was ok but I do not have records.  She describes loose urgent stools, 3+/day, often post-prandial but has also had urge incontinence in bed. Some nausea and bloating. Recent mild weight gain - had been off thyroid replacement x 1 month while switching primary care. Had labs there. Have requested. In past dicyclomine tried but no help. Is not lactose intolerance that she is aware. Has not been tested for celiac disease. Not more than 1 caffeinated beverage/day. No alcohol.  Recovering from mononucleosis and shingles in past months.  Allergies  Allergen Reactions  . Sulfur    Outpatient Prescriptions Prior to Visit  Medication Sig Dispense Refill  . lamoTRIgine (LAMICTAL) 100 MG tablet Take 100 mg by mouth daily.  1  . levothyroxine (SYNTHROID, LEVOTHROID) 75 MCG tablet   0  . Norethindrone Acetate-Ethinyl Estrad-FE (LOESTRIN 24 FE) 1-20 MG-MCG(24) tablet Take 1 tablet by mouth daily. 1 Package 11   No facility-administered medications prior to visit.   Past Medical History  Diagnosis Date  . Hypothyroidism   . IBS (irritable bowel syndrome)   . Kidney stones   . Anxiety   . Asthma   . Depression   . Allergic rhinitis    Past Surgical History  Procedure Laterality Date  . Tonsillectomy    . Colonoscopy     History   Social History  . Marital Status: Married    Spouse Name: N/A  . Number of Children: N/A  . Years of Education: N/A   Social History Main Topics  . Smoking status: Never Smoker   . Smokeless tobacco: Never Used  . Alcohol Use: 0.0 oz/week    0 Standard drinks or equivalent per week     Comment: rare  . Drug Use: No  .  Sexual Activity: Not Currently    Birth Control/ Protection: None   Other Topics Concern  . 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 Hypertension Mother   . Hypertension Father   . Liver cancer Father   . Alcohol abuse Father   . Breast cancer Paternal Aunt   . Cirrhosis Father     Review of Systems + allergis/sinus sxs, anxiety, back pain, fatigue, insomnia All other ROS negative or per HPI    Objective:   Physical Exam BP 112/60 mmHg  Pulse 68  Ht  (1.549 m)  Wt 156 lb (70.761 kg)  BMI 29.49 kg/m2 General:  Well-developed, well-nourished and in no acute distress Eyes:  anicteric. ENT:   Mouth and posterior pharynx free of lesions.  Neck:   supple w/o thyromegaly or mass.  Lungs: Clear to auscultation bilaterally. Heart:  S1S2, no rubs, murmurs, gallops. Abdomen:  soft, non-tender, no hepatosplenomegaly, hernia, or mass and BS+.  Rectal: Female staff present  Anoderm inspection normal Digital exam revealed normal resting tone and voluntary squeeze. No mass or rectocele present. Simulated defecation with valsalva revealed appropriate abdominal contraction and descent.  Lymph:  no cervical or supraclavicular adenopathy. Extremities:   no edema Skin   no rash. Neuro:  A&O x 3.  Psych:  appropriate mood and  Affect.     Assessment & Plan:  IBS (irritable bowel syndrome) - Plan: IgA, Tissue transglutaminase, IgA  Diarrhea and abdominal pain most likely from IBS. Will check for celiac disease. Need to review prior GI workup and see PCP labs from last month - have requested.   If celiac negative would try Xifaxan and keep Lotronex or Viberzi in mind since she has previously failed dicyclomine.  She will return here in April (2 months)  I appreciate the opportunity to care for this patient.   Records review shows  colonoscopy 2007 - random bxs show slightly increased chronic inflammation within [facility] and focal intraepithelial lymphocytosis  This makes me ? If she could have microscopic colituis. Unless she has had a dramatic response to the Abx I think colonoscopy and biopsy is appropriate  Iva Booparl E. Thayer Embleton, MD, [facility]

## 2015-01-07 NOTE — Patient Instructions (Addendum)
Your physician has requested that you go to the basement for the following lab work before leaving today: TTG, IGA  Please follow up with Dr. Leone PayorGessner in 2 months, appointment made for April 14th at 11:00am.  We will obtain your records with the ROI you signed.   I appreciate the opportunity to care for you. Stan Headarl Gessner, M.D., [facility]

## 2015-01-08 LAB — TISSUE TRANSGLUTAMINASE, IGA: Tissue Transglutaminase Ab, IgA: 1 U/mL (ref <4)

## 2015-01-09 ENCOUNTER — Encounter: Payer: Self-pay | Admitting: Internal Medicine

## 2015-01-09 NOTE — Progress Notes (Signed)
Quick Note:  Celiac testing is negative Rec Xifaxan 550 mg tid x 14 days for IBS (diarrhea) ______

## 2015-01-11 ENCOUNTER — Other Ambulatory Visit: Payer: Self-pay

## 2015-01-11 ENCOUNTER — Telehealth: Payer: Self-pay | Admitting: Internal Medicine

## 2015-01-11 MED ORDER — RIFAXIMIN 550 MG PO TABS: 550.0000 mg | ORAL_TABLET | Freq: Three times a day (TID) | ORAL | Status: DC

## 2015-01-11 NOTE — Telephone Encounter (Signed)
Faxed a note back to [facility] that we are working on this prior authorization thru [facility].

## 2015-01-12 ENCOUNTER — Other Ambulatory Visit: Payer: Self-pay

## 2015-01-12 MED ORDER — METRONIDAZOLE 250 MG PO TABS: 250.0000 mg | ORAL_TABLET | Freq: Three times a day (TID) | ORAL | Status: DC

## 2015-01-12 NOTE — Progress Notes (Signed)
Quick Note:  Metronidazole 250 mg tid x 10 days instead ______

## 2015-01-20 ENCOUNTER — Other Ambulatory Visit: Payer: Self-pay | Admitting: Internal Medicine

## 2015-01-21 ENCOUNTER — Telehealth: Payer: Self-pay

## 2015-01-21 NOTE — Telephone Encounter (Signed)
Left message for patient to call back  

## 2015-01-21 NOTE — Telephone Encounter (Signed)
-----   Message from Iva Booparl E Gessner, MD sent at 01/20/2015  5:48 PM EST ----- Regarding: records review/update Let her know that I have seen prior GI notes  Colonoscopy biopsies had some inflammation She could have a condition known as microscopic colitis or even other IBD (UC/Crohn's) now  If antibiotics have not helped her at all then I think repeating a colonoscopy makes sense.  She can schedule or we can discuss at f/u   Dx would be chronic diarrhea

## 2015-01-22 NOTE — Telephone Encounter (Signed)
Left message for patient to call back  

## 2015-01-25 NOTE — Telephone Encounter (Signed)
Patient notified Colon and pre-visit scheduled with the patient

## 2015-02-02 ENCOUNTER — Ambulatory Visit (AMBULATORY_SURGERY_CENTER): Payer: Self-pay | Admitting: *Deleted

## 2015-02-02 VITALS — Ht 62.0 in | Wt 157.0 lb

## 2015-02-02 DIAGNOSIS — K529 Noninfective gastroenteritis and colitis, unspecified: Secondary | ICD-10-CM

## 2015-02-02 NOTE — Progress Notes (Signed)
No egg or soy allergy-doesn't eat a lot of dairy though No home 02 use occ has post op N/V but no other issues No diet pills emmi declined

## 2015-02-22 ENCOUNTER — Emergency Department (HOSPITAL_COMMUNITY): Payer: PRIVATE HEALTH INSURANCE

## 2015-02-22 ENCOUNTER — Ambulatory Visit (AMBULATORY_SURGERY_CENTER): Payer: PRIVATE HEALTH INSURANCE | Admitting: Internal Medicine

## 2015-02-22 ENCOUNTER — Encounter (HOSPITAL_COMMUNITY): Payer: Self-pay | Admitting: Emergency Medicine

## 2015-02-22 ENCOUNTER — Encounter: Payer: Self-pay | Admitting: Internal Medicine

## 2015-02-22 ENCOUNTER — Emergency Department (HOSPITAL_COMMUNITY)
Admission: EM | Admit: 2015-02-22 | Discharge: 2015-02-22 | Disposition: A | Discharge status: Home / Self Care | Payer: PRIVATE HEALTH INSURANCE | Attending: Emergency Medicine | Admitting: Emergency Medicine

## 2015-02-22 VITALS — BP 133/73 | HR 71 | Temp 98.7°F | Resp 12 | Ht 62.0 in | Wt 157.0 lb

## 2015-02-22 DIAGNOSIS — K529 Noninfective gastroenteritis and colitis, unspecified: Secondary | ICD-10-CM

## 2015-02-22 DIAGNOSIS — Z87442 Personal history of urinary calculi: Secondary | ICD-10-CM | POA: Diagnosis not present

## 2015-02-22 DIAGNOSIS — Z8719 Personal history of other diseases of the digestive system: Secondary | ICD-10-CM | POA: Diagnosis not present

## 2015-02-22 DIAGNOSIS — R1033 Periumbilical pain: Secondary | ICD-10-CM | POA: Diagnosis not present

## 2015-02-22 DIAGNOSIS — Z8619 Personal history of other infectious and parasitic diseases: Secondary | ICD-10-CM | POA: Insufficient documentation

## 2015-02-22 DIAGNOSIS — F419 Anxiety disorder, unspecified: Secondary | ICD-10-CM | POA: Insufficient documentation

## 2015-02-22 DIAGNOSIS — R1031 Right lower quadrant pain: Secondary | ICD-10-CM | POA: Insufficient documentation

## 2015-02-22 DIAGNOSIS — J45909 Unspecified asthma, uncomplicated: Secondary | ICD-10-CM | POA: Insufficient documentation

## 2015-02-22 DIAGNOSIS — Z79899 Other long term (current) drug therapy: Secondary | ICD-10-CM | POA: Diagnosis not present

## 2015-02-22 DIAGNOSIS — Z8709 Personal history of other diseases of the respiratory system: Secondary | ICD-10-CM | POA: Insufficient documentation

## 2015-02-22 DIAGNOSIS — Z3202 Encounter for pregnancy test, result negative: Secondary | ICD-10-CM | POA: Diagnosis not present

## 2015-02-22 DIAGNOSIS — E039 Hypothyroidism, unspecified: Secondary | ICD-10-CM | POA: Insufficient documentation

## 2015-02-22 DIAGNOSIS — Z8661 Personal history of infections of the central nervous system: Secondary | ICD-10-CM | POA: Diagnosis not present

## 2015-02-22 DIAGNOSIS — R011 Cardiac murmur, unspecified: Secondary | ICD-10-CM | POA: Diagnosis not present

## 2015-02-22 DIAGNOSIS — F329 Major depressive disorder, single episode, unspecified: Secondary | ICD-10-CM | POA: Insufficient documentation

## 2015-02-22 DIAGNOSIS — K5289 Other specified noninfective gastroenteritis and colitis: Secondary | ICD-10-CM | POA: Diagnosis not present

## 2015-02-22 LAB — URINALYSIS, ROUTINE W REFLEX MICROSCOPIC
BILIRUBIN URINE: NEGATIVE
GLUCOSE, UA: NEGATIVE mg/dL
HGB URINE DIPSTICK: NEGATIVE
Ketones, ur: NEGATIVE mg/dL
Nitrite: NEGATIVE
PROTEIN: NEGATIVE mg/dL
Specific Gravity, Urine: 1.018 (ref 1.005–1.030)
Urobilinogen, UA: 0.2 mg/dL (ref 0.0–1.0)
pH: 6 (ref 5.0–8.0)

## 2015-02-22 LAB — URINE MICROSCOPIC-ADD ON

## 2015-02-22 LAB — POC URINE PREG, ED: PREG TEST UR: NEGATIVE

## 2015-02-22 MED ORDER — SODIUM CHLORIDE 0.9 % IV SOLN
500.0000 mL | INTRAVENOUS | Status: DC
Start: 2015-02-22 — End: 2015-02-23

## 2015-02-22 MED ORDER — HYDROMORPHONE HCL 1 MG/ML IJ SOLN
0.5000 mg | Freq: Once | INTRAMUSCULAR | Status: AC
  Administered 2015-02-22: 0.5 mg via INTRAVENOUS
  Filled 2015-02-22: qty 1

## 2015-02-22 MED ORDER — OXYCODONE-ACETAMINOPHEN 5-325 MG PO TABS: 2.0000 | ORAL_TABLET | ORAL | Status: DC | PRN

## 2015-02-22 MED ORDER — ONDANSETRON 4 MG PO TBDP
4.0000 mg | ORAL_TABLET | Freq: Once | ORAL | Status: AC
  Administered 2015-02-22: 4 mg via ORAL
  Filled 2015-02-22: qty 1

## 2015-02-22 MED ORDER — MORPHINE SULFATE 4 MG/ML IJ SOLN
4.0000 mg | Freq: Once | INTRAMUSCULAR | Status: DC
  Filled 2015-02-22: qty 1

## 2015-02-22 NOTE — Patient Instructions (Addendum)
The colonoscopy looked normal. I did take biopsies to look for microscopic colitis. Will call this week or next with results and plans.  I appreciate the opportunity to care for you. Iva Booparl E. Cameka Rae, MD, [facility]   Discharge instructions given. Biopsies taken. Resume previous medications. YOU HAD AN ENDOSCOPIC PROCEDURE TODAY AT THE [facility]:   Refer to the procedure report that was given to you for any specific questions about what was found during the examination.  If the procedure report does not answer your questions, please call your gastroenterologist to clarify.  If you requested that your care partner not be given the details of your procedure findings, then the procedure report has been included in a sealed envelope for you to review at your convenience later.  YOU SHOULD EXPECT: Some feelings of bloating in the abdomen. Passage of more gas than usual.  Walking can help get rid of the air that was put into your GI tract during the procedure and reduce the bloating. If you had a lower endoscopy (such as a colonoscopy or flexible sigmoidoscopy) you may notice spotting of blood in your stool or on the toilet paper. If you underwent a bowel prep for your procedure, you may not have a normal bowel movement for a few days.  Please Note:  You might notice some irritation and congestion in your nose or some drainage.  This is from the oxygen used during your procedure.  There is no need for concern and it should clear up in a day or so.  SYMPTOMS TO REPORT IMMEDIATELY:   Following lower endoscopy (colonoscopy or flexible sigmoidoscopy):  Excessive amounts of blood in the stool  Significant tenderness or worsening of abdominal pains  Swelling of the abdomen that is new, acute  Fever of 100F or higher   For urgent or emergent issues, a gastroenterologist can be reached at any hour by calling [phone].   DIET: Your first meal following the procedure should be a  small meal and then it is ok to progress to your normal diet. Heavy or fried foods are harder to digest and may make you feel nauseous or bloated.  Likewise, meals heavy in dairy and vegetables can increase bloating.  Drink plenty of fluids but you should avoid alcoholic beverages for 24 hours.  ACTIVITY:  You should plan to take it easy for the rest of today and you should NOT DRIVE or use heavy machinery until tomorrow (because of the sedation medicines used during the test).    FOLLOW UP: Our staff will call the number listed on your records the next business day following your procedure to check on you and address any questions or concerns that you may have regarding the information given to you following your procedure. If we do not reach you, we will leave a message.  However, if you are feeling well and you are not experiencing any problems, there is no need to return our call.  We will assume that you have returned to your regular daily activities without incident.  If any biopsies were taken you will be contacted by phone or by letter within the next 1-3 weeks.  Please call us at [phone] if you have not heard about the biopsies in 3 weeks.    SIGNATURES/CONFIDENTIALITY: You and/or your care partner have signed paperwork which will be entered into your electronic medical record.  These signatures attest to the fact that that the information above on your After Visit  Summary has been reviewed and is understood.  Full responsibility of the confidentiality of this discharge information lies with you and/or your care-partner. 

## 2015-02-22 NOTE — ED Notes (Signed)
D/c and prescriptions instructions explained to patient, as well as f/u with GI. Patient and S/O verbalize understanding. Patient ambulatory upon d/c in NAD, refused w/c.

## 2015-02-22 NOTE — Discharge Instructions (Signed)
°  Take pain medication as prescribed. Follow up with Dr. Leone PayorGessner for continued pain.  Abdominal Pain Many things can cause abdominal pain. Usually, abdominal pain is not caused by a disease and will improve without treatment. It can often be observed and treated at home. Your health care provider will do a physical exam and possibly order blood tests and X-rays to help determine the seriousness of your pain. However, in many cases, more time must pass before a clear cause of the pain can be found. Before that point, your health care provider may not know if you need more testing or further treatment. HOME CARE INSTRUCTIONS  Monitor your abdominal pain for any changes. The following actions may help to alleviate any discomfort you are experiencing:  Only take over-the-counter or prescription medicines as directed by your health care provider.  Do not take laxatives unless directed to do so by your health care provider.  Try a clear liquid diet (broth, tea, or water) as directed by your health care provider. Slowly move to a bland diet as tolerated. SEEK MEDICAL CARE IF:  You have unexplained abdominal pain.  You have abdominal pain associated with nausea or diarrhea.  You have pain when you urinate or have a bowel movement.  You experience abdominal pain that wakes you in the night.  You have abdominal pain that is worsened or improved by eating food.  You have abdominal pain that is worsened with eating fatty foods.  You have a fever. SEEK IMMEDIATE MEDICAL CARE IF:   Your pain does not go away within 2 hours.  You keep throwing up (vomiting).  Your pain is felt only in portions of the abdomen, such as the right side or the left lower portion of the abdomen.  You pass bloody or black tarry stools. MAKE SURE YOU:  Understand these instructions.   Will watch your condition.   Will get help right away if you are not doing well or get worse.  Document Released: 08/09/2005  Document Revised: 11/04/2013 Document Reviewed: 07/09/2013 [facility] Patient Information 2015 [place]. This information is not intended to replace advice given to you by your health care provider. Make sure you discuss any questions you have with your health care provider.

## 2015-02-22 NOTE — ED Provider Notes (Signed)
CSN: [id number]     Arrival date & time 02/22/15  1719 History   First MD Initiated Contact with Patient 02/22/15 1839     Chief Complaint  Patient presents with  . Abdominal Pain     (Consider location/radiation/quality/duration/timing/severity/associated sxs/prior Treatment) Patient is a 34 y.o. female presenting with abdominal pain. The history is provided by the patient and the spouse. No language interpreter was used.  Abdominal Pain Associated symptoms: no dysuria, no hematuria, no vaginal bleeding and no vaginal discharge   Ms Wogan is a 34 y.o white female who presents for new onset intermittent RLQ abdominal pain after a colonoscopy just prior to arrival.  It was worse after urinating while in the ED and worse with movement.  Nothing makes the pain better but it has currently subsided. When the pain does come it feels like an intense stabbing pain. She denies any fever, cough, shortness of breath, chest pain, vomiting, constipation, or urinary symptoms.   Past Medical History  Diagnosis Date  . Hypothyroidism   . IBS (irritable bowel syndrome)   . Kidney stones   . Anxiety   . Asthma   . Depression   . Allergic rhinitis   . Shingles   . Heart murmur     past hx of but not now   . Meningitis     age 45 -   Past Surgical History  Procedure Laterality Date  . Tonsillectomy    . Colonoscopy    . Wisdom tooth extraction     Family History  Problem Relation Age of Onset  . Hypertension Mother   . Hypertension Father   . Liver cancer Father   . Alcohol abuse Father   . Cirrhosis Father   . Breast cancer Paternal Aunt   . Colon cancer Neg Hx   . Esophageal cancer Neg Hx   . Rectal cancer Neg Hx    History  Substance Use Topics  . Smoking status: Never Smoker   . Smokeless tobacco: Never Used  . Alcohol Use: 0.0 oz/week    0 Standard drinks or equivalent per week     Comment: rare   OB History    Gravida Para Term Preterm AB TAB SAB Ectopic Multiple Living    Review of Systems  Gastrointestinal: Positive for abdominal pain. Negative for blood in stool, abdominal distention and rectal pain.  Genitourinary: Negative for dysuria, hematuria, vaginal bleeding and vaginal discharge.  All other systems reviewed and are negative.     Allergies  Morphine and related; Sulfur; and Vicodin  Home Medications   Prior to Admission medications   Medication Sig Start Date End Date Taking? Authorizing Provider  bisacodyl (DULCOLAX) 5 MG EC tablet Take 5 mg by mouth daily as needed for moderate constipation (constipation).   Yes Historical Provider, MD  lamoTRIgine (LAMICTAL) 100 MG tablet Take 100 mg by mouth daily. 12/02/14  Yes Historical Provider, MD  levothyroxine (SYNTHROID, LEVOTHROID) 50 MCG tablet Take 50 mcg by mouth daily before breakfast.   Yes Historical Provider, MD  LOMEDIA 24 FE 1-20 MG-MCG(24) tablet Take 1 tablet by mouth at bedtime.  12/10/14  Yes Historical Provider, MD  ondansetron (ZOFRAN-ODT) 4 MG disintegrating tablet Take 4 mg by mouth every 8 (eight) hours as needed for nausea or vomiting (nausea).    Yes Historical Provider, MD  polyethylene glycol (MIRALAX / GLYCOLAX) packet Take 17 g by mouth daily  as needed for moderate constipation (constipation).   Yes Historical Provider, MD  oxyCODONE-acetaminophen (PERCOCET/ROXICET) 5-325 MG per tablet Take 2 tablets by mouth every 4 (four) hours as needed for severe pain. 02/22/15   Emily Forse Patel-Mills, PA-C   BP 129/72 mmHg  Pulse 62  Temp(Src) 98.5 F (36.9 C) (Oral)  Resp 18  SpO2 100%  LMP 02/12/2015 Physical Exam  Constitutional: She is oriented to person, place, and time. She appears well-developed and well-nourished.  HENT:  Head: Normocephalic and atraumatic.  Eyes: Conjunctivae are normal.  Neck: Normal range of motion. Neck supple.  Cardiovascular: Normal rate, regular rhythm and normal heart sounds.   Pulmonary/Chest: Effort normal and breath sounds  normal.  Abdominal: Soft. Normal appearance. She exhibits no distension. There is tenderness in the right lower quadrant and periumbilical area. There is no rebound, no guarding and no CVA tenderness.  Musculoskeletal: Normal range of motion.  Neurological: She is alert and oriented to person, place, and time.  Skin: Skin is warm and dry.  Nursing note and vitals reviewed.   ED Course  Procedures (including critical care time) Labs Review Labs Reviewed  URINALYSIS, ROUTINE W REFLEX MICROSCOPIC - Abnormal; Notable for the following:    APPearance CLOUDY (*)    Leukocytes, UA MODERATE (*)    All other components within normal limits  URINE MICROSCOPIC-ADD ON - Abnormal; Notable for the following:    Squamous Epithelial / LPF FEW (*)    Bacteria, UA MANY (*)    All other components within normal limits  POC URINE PREG, ED    Imaging Review Ct Abdomen Pelvis Wo Contrast  02/22/2015   CLINICAL DATA:  Abdominal pain after colonoscopy with biopsy.  EXAM: CT ABDOMEN AND PELVIS WITHOUT CONTRAST  TECHNIQUE: Multidetector CT imaging of the abdomen and pelvis was performed following the standard protocol without IV contrast.  COMPARISON:  07/14/2009  FINDINGS: BODY WALL: No contributory findings.  LOWER CHEST: No contributory findings.  ABDOMEN/PELVIS:  Liver: No focal abnormality.  Biliary: No evidence of biliary obstruction or stone.  Pancreas: Unremarkable.  Spleen: Unremarkable.  Adrenals: Unremarkable.  Kidneys and ureters: Right renal calculi measuring up to 5 mm. There is a punctate stone in the lower pole left kidney. No hydronephrosis.  Bladder: Unremarkable.  Reproductive: No pathologic findings.  Bowel: Gaseous distension of the colon related to recent colonoscopy. There is no evidence of wall thickening or pneumatosis. Normal appendix.  Retroperitoneum: No mass or adenopathy.  Peritoneum: No ascites or pneumoperitoneum.  Vascular: No acute abnormality.  OSSEOUS: No acute abnormalities.  Bone  island at L2.  IMPRESSION: 1. No pneumoperitoneum or other adverse finding after colonoscopy. 2. Bilateral nephrolithiasis.   Electronically Signed   By: Marnee SpringJonathon  Watts M.D.   On: 02/22/2015 21:08     EKG Interpretation None      MDM   Final diagnoses:  Right lower quadrant abdominal pain  Patient was sent here by Dr. Leone PayorGessner after colonoscopy today.  Patient complained of RLQ pain upon waking from after the procedure.  On exam she has periumbilical tenderness and RLQ tenderness to palpation.  19:20 Dr. Ethelda ChickJacubowitz spoke to GI, I ordered a CT w/o contrast to rule out perforation and send her home if negative. No labs necessary.  Vitals are normal.  CT showed right renal stone which is unlikely source of her pain. She was given dilaudid while in the ED and her pain subsided.  She is to follow up with Dr. Leone PayorGessner for further evaluation.  I gave her Percocet #10 for pain.     Catha Gosselin, PA-C 02/23/15 0111  Doug Sou, MD 02/23/15 1331

## 2015-02-22 NOTE — Progress Notes (Signed)
Called to room to assist during endoscopic procedure.  Patient ID and intended procedure confirmed with present staff. Received instructions for my participation in the procedure from the performing physician.  

## 2015-02-22 NOTE — Op Note (Signed)
[facility] [address] [place], [id number]   COLONOSCOPY PROCEDURE REPORT  PATIENT: Alison Clarke, Alison Clarke  MR#: [id number] BIRTHDATE: [birth year] , 34  yrs. old GENDER: female ENDOSCOPIST: Iva Booparl E Gessner, MD, [facility] PROCEDURE DATE:  02/22/2015 PROCEDURE:   Colonoscopy with biopsy and Colonoscopy, diagnostic First Screening Colonoscopy - Avg.  risk and is 50 yrs.  old or older - No.  Prior Negative Screening - Now for repeat screening. N/A  History of Adenoma - Now for follow-up colonoscopy & has been > or = to 3 yrs.  N/A ASA CLASS:   Class II INDICATIONS:Clinically significant diarrhea of unexplained origin and Patient is not applicable for Colorectal Neoplasm Risk Assessment for this procedure. MEDICATIONS: Propofol 250 mg IV and Monitored anesthesia care  DESCRIPTION OF PROCEDURE:   After the risks benefits and alternatives of the procedure were thoroughly explained, informed consent was obtained.  The digital rectal exam revealed no abnormalities of the rectum.   The LB [id number]  endoscope was introduced through the anus and advanced to the terminal ileum which was intubated for a short distance. No adverse events experienced.   The quality of the prep was excellent.  (MiraLax was used)  The instrument was then slowly withdrawn as the colon was fully examined.      COLON FINDINGS: The examined terminal ileum appeared to be normal. The colonic mucosa appeared normal throughout the entire examined colon.  Multiple random biopsies were performed using cold forceps. Sample was obtained and sent to histology.  Retroflexed views revealed no abnormalities. The time to cecum = 2.1 Withdrawal time = 5.5   The scope was withdrawn and the procedure completed. COMPLICATIONS: There were no immediate complications.  ENDOSCOPIC IMPRESSION: 1.   The examined terminal ileum appeared to be normal 2.   The colonic mucosa appeared normal throughout the  entire examined colon; multiple random biopsies were performed using cold forceps  RECOMMENDATIONS: Office will call with the results and plans  eSigned:  Iva Booparl E Gessner, MD, [facility] 02/22/2015 4:12 PM   cc: The Patient

## 2015-02-22 NOTE — Progress Notes (Signed)
after doing well she developed sharp and severe RLQ pain radiating to groin. Had urge to urinate but could not. Has passed flatus. On exam - no CVAT, slight RLQ tenderness only with benign abdomen overall. BS +. Hyoscyamine SL 0.125mg  x 2 no help.  Sxs seem more like kidney stone but since just had colonoscopy will send to ED for further evaluation and treatment -

## 2015-02-22 NOTE — ED Notes (Signed)
Pt was able to void 30 mL of urine but is still having abdominal pains. The nurse will bladder scan the pt to see if pt is retaining urine.

## 2015-02-22 NOTE — Progress Notes (Signed)
Pt awake alert, pleased with MAC, Report to RN

## 2015-02-22 NOTE — ED Notes (Addendum)
Bed: [facility] Expected date:  Expected time:  Means of arrival:  Comments: ems  

## 2015-02-22 NOTE — Progress Notes (Signed)
Patient ambulated to bathroom. Stated had to pee but couldn't. History of kidney stones. Right lower abdominal pain . Pain scale of 7. Dr. Leone PayorGessner in facility to examine patient. Hyoscyamine given sublingual to aide in abdominal discomfort. No relief. New order to send patient to emergency room to treat and evaluate as needed.

## 2015-02-22 NOTE — ED Notes (Addendum)
Per EMS. Pt from [facility]. Finished colonoscopy and biopsy, and began to have RLQ pain in recovery room. Pt has hx of kidney stones, but pain feels different from when she had kidney stones. Pt reported she was having difficulty urinating to colonoscopy staff. Staff though pain could be from retained air or kidney stone. Colonoscopy impression appears to be normal per paperwork that came with pt.

## 2015-02-22 NOTE — Progress Notes (Signed)
EMS notified of transfer. Operator also notified. Karl BalesKristen Westbrook RN at main entrance waiting for EMS. EMS here for transport at 1510.

## 2015-02-23 ENCOUNTER — Telehealth: Payer: Self-pay

## 2015-02-23 NOTE — Telephone Encounter (Signed)
  Follow up Call-  Call back number 02/22/2015  Post procedure Call Back phone  # [phone] hm  Permission to leave phone message Yes     Patient questions:  Do you have a fever, pain , or abdominal swelling? No. Pain Score  0 *  Have you tolerated food without any problems? Yes.    Have you been able to return to your normal activities? Yes.    Do you have any questions about your discharge instructions: Diet   No. Medications  No. Follow up visit  No.  Do you have questions or concerns about your Care? No.  Actions: * If pain score is 4 or above: No action needed, pain <4.

## 2015-02-25 ENCOUNTER — Encounter: Payer: Self-pay | Admitting: Internal Medicine

## 2015-02-25 ENCOUNTER — Ambulatory Visit: Payer: PRIVATE HEALTH INSURANCE | Admitting: Internal Medicine

## 2015-02-25 DIAGNOSIS — K52831 Collagenous colitis: Secondary | ICD-10-CM | POA: Insufficient documentation

## 2015-02-25 HISTORY — DX: Collagenous colitis: K52.831

## 2015-02-25 NOTE — Progress Notes (Signed)
Quick Note:  Call and tell her she has a colitis - collagenous Start Entocort EC 9 mg / day # 90 2 RF - see me in 6-8 weeks - stay on the medication until then  [facility] no letter or recall ______

## 2015-02-26 ENCOUNTER — Other Ambulatory Visit: Payer: Self-pay

## 2015-02-26 MED ORDER — BUDESONIDE 3 MG PO CPEP: 9.0000 mg | ORAL_CAPSULE | Freq: Every day | ORAL | Status: DC

## 2015-03-09 ENCOUNTER — Telehealth: Payer: Self-pay | Admitting: Internal Medicine

## 2015-03-09 NOTE — Telephone Encounter (Signed)
Phone line is still busy

## 2015-03-09 NOTE — Telephone Encounter (Signed)
Phone line is busy.  I will continue to try and reach the patient 

## 2015-03-09 NOTE — Telephone Encounter (Signed)
Phone line busy.

## 2015-03-09 NOTE — Telephone Encounter (Signed)
Phone line is still busy.  I will continue to try and reach the patient

## 2015-03-09 NOTE — Telephone Encounter (Signed)
Phone line is still busy.  I will continue to try and reach the patient 

## 2015-03-10 NOTE — Telephone Encounter (Signed)
Patient phone line is still busy.  I did leave a message with her sister's voicemail asking if she had a way to contact her or call and confirm the phone numbers we have are correct.

## 2015-03-11 NOTE — Telephone Encounter (Signed)
Phone line is still busy.  NO return call from her sister.  I will mail her a letter asking her to call with a working phone number.

## 2015-04-13 ENCOUNTER — Ambulatory Visit (INDEPENDENT_AMBULATORY_CARE_PROVIDER_SITE_OTHER): Payer: PRIVATE HEALTH INSURANCE | Admitting: Physician Assistant

## 2015-04-13 ENCOUNTER — Encounter: Payer: Self-pay | Admitting: Physician Assistant

## 2015-04-13 VITALS — BP 125/85 | HR 75 | Ht 62.0 in | Wt 163.6 lb

## 2015-04-13 DIAGNOSIS — R102 Pelvic and perineal pain: Secondary | ICD-10-CM

## 2015-04-13 DIAGNOSIS — N83201 Unspecified ovarian cyst, right side: Secondary | ICD-10-CM | POA: Insufficient documentation

## 2015-04-13 DIAGNOSIS — N8320 Unspecified ovarian cysts: Secondary | ICD-10-CM

## 2015-04-13 LAB — POCT URINALYSIS DIPSTICK
Bilirubin, UA: NEGATIVE
GLUCOSE UA: NEGATIVE
KETONES UA: NEGATIVE
Leukocytes, UA: NEGATIVE
NITRITE UA: NEGATIVE
PH UA: 6
PROTEIN UA: NEGATIVE
RBC UA: NEGATIVE
Urobilinogen, UA: NEGATIVE

## 2015-04-13 NOTE — Progress Notes (Signed)
Bedside ultrasound shows normal appearing uterus and normal left ovary.  Right ovary contains a 2.37cm follicular cyst.

## 2015-04-13 NOTE — Progress Notes (Signed)
Patient has been having pelvic pain on and off for two weeks.  She wants to be sure she is not having an ovarian cyst.  She does have a history of kidney stones as well.  She also has some questions regarding her oral birth control pills and lamictal.  Her pharmacist said she should not be taking these two things together.  She has used the IUD in the past.

## 2015-04-13 NOTE — Patient Instructions (Signed)
Ovarian Cyst An ovarian cyst is a fluid-filled sac that forms on an ovary. The ovaries are small organs that produce eggs in women. Various types of cysts can form on the ovaries. Most are not cancerous. Many do not cause problems, and they often go away on their own. Some may cause symptoms and require treatment. Common types of ovarian cysts include:  Functional cysts--These cysts may occur every month during the menstrual cycle. This is normal. The cysts usually go away with the next menstrual cycle if the woman does not get pregnant. Usually, there are no symptoms with a functional cyst.  Endometrioma cysts--These cysts form from the tissue that lines the uterus. They are also called "chocolate cysts" because they become filled with blood that turns brown. This type of cyst can cause pain in the lower abdomen during intercourse and with your menstrual period.  Cystadenoma cysts--This type develops from the cells on the outside of the ovary. These cysts can get very big and cause lower abdomen pain and pain with intercourse. This type of cyst can twist on itself, cut off its blood supply, and cause severe pain. It can also easily rupture and cause a lot of pain.  Dermoid cysts--This type of cyst is sometimes found in both ovaries. These cysts may contain different kinds of body tissue, such as skin, teeth, hair, or cartilage. They usually do not cause symptoms unless they get very big.  Theca lutein cysts--These cysts occur when too much of a certain hormone (human chorionic gonadotropin) is produced and overstimulates the ovaries to produce an egg. This is most common after procedures used to assist with the conception of a baby (in vitro fertilization). CAUSES   Fertility drugs can cause a condition in which multiple large cysts are formed on the ovaries. This is called ovarian hyperstimulation syndrome.  A condition called polycystic ovary syndrome can cause hormonal imbalances that can lead to  nonfunctional ovarian cysts. SIGNS AND SYMPTOMS  Many ovarian cysts do not cause symptoms. If symptoms are present, they may include:  Pelvic pain or pressure.  Pain in the lower abdomen.  Pain during sexual intercourse.  Increasing girth (swelling) of the abdomen.  Abnormal menstrual periods.  Increasing pain with menstrual periods.  Stopping having menstrual periods without being pregnant. DIAGNOSIS  These cysts are commonly found during a routine or annual pelvic exam. Tests may be ordered to find out more about the cyst. These tests may include:  Ultrasound.  X-ray of the pelvis.  CT scan.  MRI.  Blood tests. TREATMENT  Many ovarian cysts go away on their own without treatment. Your health care provider may want to check your cyst regularly for 2-3 months to see if it changes. For women in menopause, it is particularly important to monitor a cyst closely because of the higher rate of ovarian cancer in menopausal women. When treatment is needed, it may include any of the following:  A procedure to drain the cyst (aspiration). This may be done using a long needle and ultrasound. It can also be done through a laparoscopic procedure. This involves using a thin, lighted tube with a tiny camera on the end (laparoscope) inserted through a small incision.  Surgery to remove the whole cyst. This may be done using laparoscopic surgery or an open surgery involving a larger incision in the lower abdomen.  Hormone treatment or birth control pills. These methods are sometimes used to help dissolve a cyst. HOME CARE INSTRUCTIONS   Only take over-the-counter   or prescription medicines as directed by your health care provider.  Follow up with your health care provider as directed.  Get regular pelvic exams and Pap tests. SEEK MEDICAL CARE IF:   Your periods are late, irregular, or painful, or they stop.  Your pelvic pain or abdominal pain does not go away.  Your abdomen becomes  larger or swollen.  You have pressure on your bladder or trouble emptying your bladder completely.  You have pain during sexual intercourse.  You have feelings of fullness, pressure, or discomfort in your stomach.  You lose weight for no apparent reason.  You feel generally ill.  You become constipated.  You lose your appetite.  You develop acne.  You have an increase in body and facial hair.  You are gaining weight, without changing your exercise and eating habits.  You think you are pregnant. SEEK IMMEDIATE MEDICAL CARE IF:   You have increasing abdominal pain.  You feel sick to your stomach (nauseous), and you throw up (vomit).  You develop a fever that comes on suddenly.  You have abdominal pain during a bowel movement.  Your menstrual periods become heavier than usual. MAKE SURE YOU:  Understand these instructions.  Will watch your condition.  Will get help right away if you are not doing well or get worse. Document Released: 10/30/2005 Document Revised: 11/04/2013 Document Reviewed: 07/07/2013 ExitCare Patient Information 2015 [employer]. This information is not intended to replace advice given to you by your health care provider. Make sure you discuss any questions you have with your health care provider.  

## 2015-04-13 NOTE — Progress Notes (Signed)
Patient ID: Alison Clarke, female   DOB: [birth year], 34 y.o.   MRN: [id number] History:  Alison Clarke is a 34 y.o. [id number] who presents to clinic today for pain.  Pain started 2 weeks ago - more right sided.  It started sharp but presently is a low level of pain.  History of kidney stones as well as ovarian cyst.  She had Mirena out Feb of this year and went 2 months without contraception and now is using OCP.  OCP started early April?   No regular period this time - brown spotting x 3 days.   She sees Dr. Leone PayorGessner for stomach issues.  Mid April went to ED after colonoscopy for pain - kidney stones present in kidneys but not descending.  She goes to [facility] - Dr. Vernie Ammonsttelin.   On Lamictal for OCD.   Has had recent weight gain but attributes this to possible thyroid or stomach concerns or medication side effect. Has been drinking more water and urinating well.  Denies fever, weakness, chest pain, shortness of breath, swelling of extremities, vaginal discharge, back pain.  She has been having increased headaches.    The following portions of the patient's history were reviewed and updated as appropriate: allergies, current medications, past family history, past medical history, past social history, past surgical history and problem list.  Review of Systems:  Pertinent ROS in HPI.  All other systems are negative.   Objective:  Physical Exam BP 125/85 mmHg  Pulse 75  Ht 5\' 2"  (1.575 m)  Wt 163 lb 9.6 oz (74.208 kg)  BMI 29.92 kg/m2  LMP 03/30/2015 (Exact Date) GENERAL: Well-developed, well-nourished female in no acute distress.  HEENT: Normocephalic, atraumatic.  NECK: Supple. Normal thyroid.  LUNGS: Normal rate. Clear to auscultation bilaterally.  HEART: Regular rate and rhythm with no adventitious sounds.  ABDOMEN: Soft, nontender, nondistended. No organomegaly.  No CVA tenderness PELVIC: Normal external female genitalia. No CMT.  Uterus is normal in size. No adnexal mass or  tenderness.  EXTREMITIES: No cyanosis, clubbing, or edema, 2+ distal pulses.   Labs and Imaging In office u/s shows 2.37 follicular cyst on right   U/A with elevated specific gravity but no blood or sign of infection Assessment & Plan:  Assessment: 1. Pelvic pain in female   2. Right ovarian cyst    Plans: Continue with OCP.  Just started 3rd pack.  Ibuprofen for pain relief.  No new plan as pain is improving.  However if pain begins to worsen again - return to clinic.  If severe - go to [facility].  Risks of torsion discussed.   F/u with other specialists for non-gyn concerns. May return here for HA discussion with [facility] if no improvement.    Bertram DenverKaren E Teague Clark, PA-C 04/13/2015 10:16 AM

## 2015-04-26 ENCOUNTER — Encounter: Payer: Self-pay | Admitting: Internal Medicine

## 2015-04-26 ENCOUNTER — Ambulatory Visit (INDEPENDENT_AMBULATORY_CARE_PROVIDER_SITE_OTHER): Payer: PRIVATE HEALTH INSURANCE | Admitting: Internal Medicine

## 2015-04-26 VITALS — BP 104/68 | HR 68 | Ht 62.0 in | Wt 162.4 lb

## 2015-04-26 DIAGNOSIS — K5289 Other specified noninfective gastroenteritis and colitis: Secondary | ICD-10-CM | POA: Diagnosis not present

## 2015-04-26 DIAGNOSIS — K52831 Collagenous colitis: Secondary | ICD-10-CM

## 2015-04-26 MED ORDER — MESALAMINE 1.2 G PO TBEC: 4.8000 g | DELAYED_RELEASE_TABLET | Freq: Every day | ORAL | Status: DC

## 2015-04-26 MED ORDER — COLESTIPOL HCL 1 G PO TABS: 2.0000 g | ORAL_TABLET | Freq: Every day | ORAL | Status: DC

## 2015-04-26 NOTE — Patient Instructions (Signed)
  We have sent the following medications to your pharmacy for you to pick up at your convenience: Lialda and colestid   Follow up with Dr. Leone Payor in 3 months.    I appreciate the opportunity to care for you. Stan Head, M.D. FACG

## 2015-04-27 ENCOUNTER — Encounter: Payer: Self-pay | Admitting: Internal Medicine

## 2015-04-27 NOTE — Progress Notes (Signed)
   Subjective:    Patient ID: Alison Clarke, female    DOB: Jul 21, 1981, 34 y.o.   MRN: [id number] Chief complaint: Colitis, diarrhea HPI The patient is a nice young middle-aged woman that I recently diagnosed collagenous colitis in. Colonoscopy was performed. She was started on Entocort EC. She says that has caused bloating and weight gain, her weight is up 6 pounds in the last few months. It is possibly helped her diarrhea some eliminating nocturnal symptoms. She still has about 5 loose or watery bowel movements a day. She has been tapering off the Entocort because it makes her feel worse with bloating etc.  Medications, allergies, past medical history, past surgical history, family history and social history are reviewed and updated in the EMR.  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 Started on Entocort EC. 1-2 once treatment not very effective per patient and caused side effects. Bloating weight gain. I have explained the nature of this disease. We'll try mesalamine in the form of Lialda 4.8 g daily and also some symptomatic treatment with 2 g colestipol daily. I will see her back in 3 months. She knows to contact me sooner as needed. I told her that there is no quick cure this way.  15 minutes time spent with patient > half in counseling coordination of care  I appreciate the opportunity to care for this patient.

## 2015-04-27 NOTE — Assessment & Plan Note (Signed)
Diagnosed in April 2016. Started on Entocort EC. 1-2 once treatment not very effective per patient and caused side effects. Bloating weight gain. I have explained the nature of this disease. We'll try mesalamine in the form of Lialda 4.8 g daily and also some symptomatic treatment with 2 g colestipol daily. I will see her back in 3 months. She knows to contact me sooner as needed. I told her that there is no quick cure this way.

## 2015-07-25 ENCOUNTER — Ambulatory Visit: Payer: PRIVATE HEALTH INSURANCE

## 2015-07-25 ENCOUNTER — Encounter: Payer: Self-pay | Admitting: Emergency Medicine

## 2015-07-25 ENCOUNTER — Ambulatory Visit
Admission: EM | Admit: 2015-07-25 | Discharge: 2015-07-25 | Disposition: A | Discharge status: Home / Self Care | Payer: PRIVATE HEALTH INSURANCE | Attending: Family Medicine | Admitting: Family Medicine

## 2015-07-25 DIAGNOSIS — S96911A Strain of unspecified muscle and tendon at ankle and foot level, right foot, initial encounter: Secondary | ICD-10-CM | POA: Diagnosis not present

## 2015-07-25 DIAGNOSIS — M79671 Pain in right foot: Secondary | ICD-10-CM | POA: Diagnosis not present

## 2015-07-25 HISTORY — DX: Noninfective gastroenteritis and colitis, unspecified: K52.9

## 2015-07-25 MED ORDER — MELOXICAM 15 MG PO TABS: 15.0000 mg | ORAL_TABLET | Freq: Every day | ORAL | Status: DC

## 2015-07-25 NOTE — ED Notes (Signed)
Patient present here with c/o pain int he rt toe sicen 1 month. Patient states that her toes bend while taking shower, states that the top of her toes hurt and cannot bend them

## 2015-07-25 NOTE — Discharge Instructions (Signed)
Foot Sprain The muscles and cord like structures which attach muscle to bone (tendons) that surround the feet are made up of units. A foot sprain can occur at the weakest spot in any of these units. This condition is most often caused by injury to or overuse of the foot, as from playing contact sports, or aggravating a previous injury, or from poor conditioning, or obesity. SYMPTOMS  Pain with movement of the foot.  Tenderness and swelling at the injury site.  Loss of strength is present in moderate or severe sprains. THE THREE GRADES OR SEVERITY OF FOOT SPRAIN ARE:  Mild (Grade I): Slightly pulled muscle without tearing of muscle or tendon fibers or loss of strength.  Moderate (Grade II): Tearing of fibers in a muscle, tendon, or at the attachment to bone, with small decrease in strength.  Severe (Grade III): Rupture of the muscle-tendon-bone attachment, with separation of fibers. Severe sprain requires surgical repair. Often repeating (chronic) sprains are caused by overuse. Sudden (acute) sprains are caused by direct injury or over-use. DIAGNOSIS  Diagnosis of this condition is usually by your own observation. If problems continue, a caregiver may be required for further evaluation and treatment. X-rays may be required to make sure there are not breaks in the bones (fractures) present. Continued problems may require physical therapy for treatment. PREVENTION  Use strength and conditioning exercises appropriate for your sport.  Warm up properly prior to working out.  Use athletic shoes that are made for the sport you are participating in.  Allow adequate time for healing. Early return to activities makes repeat injury more likely, and can lead to an unstable arthritic foot that can result in prolonged disability. Mild sprains generally heal in 3 to 10 days, with moderate and severe sprains taking 2 to 10 weeks. Your caregiver can help you determine the proper time required for  healing. HOME CARE INSTRUCTIONS   Apply ice to the injury for 15-20 minutes, 03-04 times per day. Put the ice in a plastic bag and place a towel between the bag of ice and your skin.  An elastic wrap (like an Ace bandage) may be used to keep swelling down.  Keep foot above the level of the heart, or at least raised on a footstool, when swelling and pain are present.  Try to avoid use other than gentle range of motion while the foot is painful. Do not resume use until instructed by your caregiver. Then begin use gradually, not increasing use to the point of pain. If pain does develop, decrease use and continue the above measures, gradually increasing activities that do not cause discomfort, until you gradually achieve normal use.  Use crutches if and as instructed, and for the length of time instructed.  Keep injured foot and ankle wrapped between treatments.  Massage foot and ankle for comfort and to keep swelling down. Massage from the toes up towards the knee.  Only take over-the-counter or prescription medicines for pain, discomfort, or fever as directed by your caregiver. SEEK IMMEDIATE MEDICAL CARE IF:   Your pain and swelling increase, or pain is not controlled with medications.  You have loss of feeling in your foot or your foot turns cold or blue.  You develop new, unexplained symptoms, or an increase of the symptoms that brought you to your caregiver. MAKE SURE YOU:   Understand these instructions.  Will watch your condition.  Will get help right away if you are not doing well or get worse. Document Released:  04/21/2002 Document Revised: 01/22/2012 Document Reviewed: 06/18/2008 [employer] Patient Information 2015 [place], [place]. This information is not intended to replace advice given to you by your health care provider. Make sure you discuss any questions you have with your health care provider.  Ligament Sprain A ligament sprain is when the bands of tissue that hold  bones together (ligament) are stretched. HOME CARE   Rest the injured area.  Start using the joint when told to by your doctor.  Keep the injured area raised (elevated) above the level of the heart. This may lessen puffiness (swelling).  Put ice on the injured area.  Put ice in a plastic bag.  Place a towel between your skin and the bag.  Leave the ice on for 15-20 minutes, 03-04 times a day.  Wear a splint, cast, or an elastic bandage as told by your doctor.  Only take medicine as told by your doctor.  Use crutches as told by your doctor. Do not put weight on the injured joint until told to by your doctor. GET HELP RIGHT AWAY IF:   You have more bruising, puffiness, or pain.  The leg was injured and the toes are cold, tingling, numb, or blue.  The arm was injured and the fingers are cold, tingling, numb, or blue.  The pain is not helped with medicine.  The pain gets worse. MAKE SURE YOU:   Understand these instructions.  Will watch this condition.  Will get help right away if you are not doing well or get worse. Document Released: 04/17/2008 Document Revised: 08/20/2013 Document Reviewed: 04/17/2008 [facility] Patient Information 2015 [place], [place]. This information is not intended to replace advice given to you by your health care provider. Make sure you discuss any questions you have with your health care provider.

## 2015-07-25 NOTE — ED Provider Notes (Signed)
CSN: [id number]     Arrival date & time 07/25/15  1059 History   First MD Initiated Contact with Patient 07/25/15 1147     Chief Complaint  Patient presents with  . Toe Pain   patient reports pain in the toes difficulty wiggling her toes 1-3. She states that the pain gets worse at times dipping shoes she wears and she has difficulty with swelling of the foot episodically (Consider location/radiation/quality/duration/timing/severity/associated sxs/prior Treatment) Patient is a 34 y.o. female presenting with toe pain. The history is provided by the patient.  Toe Pain This is a new problem. The current episode started more than 1 week ago (Month ago injury occurred). Progression since onset: The pain waxes and wanes she states that she's had a fall while in the pain starts back up again. Pertinent negatives include no chest pain, no abdominal pain and no headaches. The symptoms are aggravated by walking (Pounds shoes make the pain worse than others). Nothing relieves the symptoms. She has tried nothing for the symptoms.    Past Medical History  Diagnosis Date  . Hypothyroidism   . IBS (irritable bowel syndrome)   . Kidney stones   . Anxiety   . Asthma   . Depression   . Allergic rhinitis   . Shingles   . Heart murmur     past hx of but not now   . Meningitis     age 24 -  . Collagenous colitis 02/25/2015    02/2015 colonoscopy  . Colitis   . Kidney stones    Past Surgical History  Procedure Laterality Date  . Tonsillectomy    . Colonoscopy    . Wisdom tooth extraction     Family History  Problem Relation Age of Onset  . Hypertension Mother   . Hypertension Father   . Liver cancer Father   . Alcohol abuse Father   . Cirrhosis Father   . Breast cancer Paternal Aunt   . Colon cancer Neg Hx   . Esophageal cancer Neg Hx   . Rectal cancer Neg Hx    Social History  Substance Use Topics  . Smoking status: Never Smoker   . Smokeless tobacco: Never Used  . Alcohol Use: 0.0  oz/week    0 Standard drinks or equivalent per week     Comment: rare   OB History    Gravida Para Term Preterm AB TAB SAB Ectopic Multiple Living   Review of Systems  Constitutional: Positive for fatigue.  Cardiovascular: Negative for chest pain.  Gastrointestinal: Negative for abdominal pain.  Musculoskeletal: Positive for myalgias, joint swelling and gait problem.  Skin: Negative.   Neurological: Negative for headaches.    Allergies  Morphine and related; Sulfur; and Vicodin  Home Medications   Prior to Admission medications   Medication Sig Start Date End Date Taking? Authorizing Provider  colestipol (COLESTID) 1 G tablet Take 2 tablets (2 g total) by mouth daily with supper. 04/26/15  Yes Iva Boop, MD  lamoTRIgine (LAMICTAL) 100 MG tablet Take 100 mg by mouth daily. 12/02/14  Yes Historical Provider, MD  levothyroxine (SYNTHROID, LEVOTHROID) 75 MCG tablet  04/04/15  Yes Historical Provider, MD  LOMEDIA 24 FE 1-20 MG-MCG(24) tablet Take 1 tablet by mouth at bedtime.  12/10/14  Yes Historical Provider, MD  mesalamine (LIALDA) 1.2 G EC tablet Take 4 tablets (4.8 g total) by mouth daily with breakfast. 04/26/15  Yes Iva Boop, MD  ondansetron (ZOFRAN-ODT) 4 MG disintegrating tablet Take 4 mg by mouth every 8 (eight) hours as needed for nausea or vomiting (nausea).    Yes Historical Provider, MD  meloxicam (MOBIC) 15 MG tablet Take 1 tablet (15 mg total) by mouth daily. 07/25/15   Hassan Rowan, MD   Meds Ordered and Administered this Visit  Medications - No data to display  BP 119/72 mmHg  Pulse 72  Temp(Src) 98.4 F (36.9 C) (Oral)  Resp 18  Ht 5\' 2"  (1.575 m)  Wt 160 lb (72.576 kg)  BMI 29.26 kg/m2  SpO2 100%  LMP 07/20/2015 No data found.   Physical Exam  Constitutional: She is oriented to person, place, and time. She appears well-developed and well-nourished.  HENT:  Head: Normocephalic and atraumatic.  Eyes: Conjunctivae are normal.  Pupils are equal, round, and reactive to light.  Musculoskeletal: She exhibits tenderness.       Feet:  Patient has tenderness along the dorsum of her right foot pulses intact  Neurological: She is alert and oriented to person, place, and time.  Skin: Skin is warm and dry.  Psychiatric: She has a normal mood and affect.  Vitals reviewed.   ED Course  Procedures (including critical care time)  Labs Review Labs Reviewed - No data to display  Imaging Review Dg Foot Complete Right  07/25/2015   CLINICAL DATA:  Slipped and fell 2-3 weeks ago, pain in the second apparent metatarsal regions  EXAM: RIGHT FOOT COMPLETE - 3+ VIEW  COMPARISON:  None.  FINDINGS: There is no evidence of fracture or dislocation. There is no evidence of arthropathy or other focal bone abnormality. Soft tissues are unremarkable. Mild plantar calcaneal spurring.  IMPRESSION: Negative.   Electronically Signed   By: Christiana Pellant M.D.   On: 07/25/2015 12:07     Visual Acuity Review  Right Eye Distance:   Left Eye Distance:   Bilateral Distance:    Right Eye Near:   Left Eye Near:    Bilateral Near:         MDM   1. Right foot pain   2. Right foot strain, initial encounter       Discussed patient appears that she has strained the flexor ligaments on the dorsum of the right foot. 2 or that different shoes she is wearing spurring pressure on that area and that she's not doing the area to heal is probably why when she changed shoes to start having pain and discomfort. [place] pain and swelling of the foot. Recommend we place her in a walking boot. It will x-rays were negative for fracture of the foot I think this line with those ligaments going to heal. We talked about options seen a podiatrist and she is interested in that and will try to go and get that set up for her in the near future as well. Recommend Mobic 15 mg 1 tablet day for pain.    Hassan Rowan,  MD 07/26/15 [phone]

## 2015-07-25 NOTE — ED Notes (Signed)
Patient transported to X-ray 

## 2015-08-12 ENCOUNTER — Encounter: Payer: Self-pay | Admitting: Family Medicine

## 2015-08-12 ENCOUNTER — Ambulatory Visit (INDEPENDENT_AMBULATORY_CARE_PROVIDER_SITE_OTHER): Payer: PRIVATE HEALTH INSURANCE | Admitting: Family Medicine

## 2015-08-12 ENCOUNTER — Ambulatory Visit: Payer: PRIVATE HEALTH INSURANCE | Admitting: Podiatry

## 2015-08-12 VITALS — BP 114/77 | HR 81 | Resp 20 | Ht 62.0 in | Wt 167.0 lb

## 2015-08-12 DIAGNOSIS — R102 Pelvic and perineal pain: Secondary | ICD-10-CM

## 2015-08-12 LAB — POCT URINALYSIS DIPSTICK
Bilirubin, UA: NEGATIVE
Blood, UA: NEGATIVE
Glucose, UA: NEGATIVE
Ketones, UA: NEGATIVE
LEUKOCYTES UA: NEGATIVE
Nitrite, UA: NEGATIVE
Spec Grav, UA: 1.025
UROBILINOGEN UA: 0.2
pH, UA: 5

## 2015-08-12 NOTE — Progress Notes (Signed)
    Subjective:    Patient ID: Alison Clarke is a 34 y.o. female presenting with Pelvic Pain  on 08/12/2015  HPI: 2 wks of sporadic lower abdominal pain.  Pain is right sided. Notes that she had previous episode of right sided cyst. On Lamictal. Notes recent weight gain. On OC's and has h/o kidney stones. Denies dysuria, or fever, chills.  Review of Systems  Constitutional: Negative for fever and chills.  Respiratory: Negative for shortness of breath.   Cardiovascular: Negative for chest pain.  Gastrointestinal: Negative for nausea, vomiting and abdominal pain.  Genitourinary: Negative for dysuria.  Skin: Negative for rash.      Objective:    BP 114/77 mmHg  Pulse 81  Resp 20  Ht  (1.575 m)  Wt 167 lb (75.751 kg)  BMI 30.54 kg/m2  LMP 07/20/2015 Physical Exam  Constitutional: She is oriented to person, place, and time. She appears well-developed and well-nourished. No distress.  HENT:  Head: Normocephalic and atraumatic.  Eyes: No scleral icterus.  Neck: Neck supple.  Cardiovascular: Normal rate.   Pulmonary/Chest: Effort normal.  Abdominal: Soft.  Genitourinary:  BUS normal, vagina is pink and rugated, cervix is nulliparous without lesion, no CMT uterus is small and anteverted, right adnexal fullness but no mass, minimally tender.   Neurological: She is alert and oriented to person, place, and time.  Skin: Skin is warm and dry.  Psychiatric: She has a normal mood and affect.   Urinalysis    Component Value Date/Time   COLORURINE YELLOW 02/22/2015 1806   COLORURINE Yellow 12/21/2014 1322   APPEARANCEUR CLOUDY* 02/22/2015 1806   APPEARANCEUR Hazy 12/21/2014 1322   LABSPEC 1.018 02/22/2015 1806   LABSPEC 1.010 12/21/2014 1322   PHURINE 6.0 02/22/2015 1806   PHURINE 6.0 12/21/2014 1322   GLUCOSEU NEGATIVE 02/22/2015 1806   GLUCOSEU Negative 12/21/2014 1322   HGBUR NEGATIVE 02/22/2015 1806   HGBUR 1+ 12/21/2014 1322   BILIRUBINUR negative 08/12/2015 1131     BILIRUBINUR NEGATIVE 02/22/2015 1806   BILIRUBINUR Negative 12/21/2014 1322   KETONESUR NEGATIVE 02/22/2015 1806   KETONESUR Negative 12/21/2014 1322   PROTEINUR trace 08/12/2015 1131   PROTEINUR NEGATIVE 02/22/2015 1806   PROTEINUR Negative 12/21/2014 1322   UROBILINOGEN 0.2 08/12/2015 1131   UROBILINOGEN 0.2 02/22/2015 1806   NITRITE negative 08/12/2015 1131   NITRITE NEGATIVE 02/22/2015 1806   NITRITE Negative 12/21/2014 1322   LEUKOCYTESUR Negative 08/12/2015 1131   LEUKOCYTESUR 1+ 12/21/2014 1322          Assessment & Plan:   Problem List Items Addressed This Visit    None    Visit Diagnoses    Pelvic pain in female    -  Primary    probable ruptured cyst--worrisome signs reviewed. Return if no improvement    Relevant Orders    POCT Urinalysis Dipstick (Completed)        Return in about 3 months (around 11/11/2015) for a follow-up.  PRATT,TANYA S 08/12/2015 11:24 AM

## 2015-08-12 NOTE — Patient Instructions (Signed)
Ovarian Cyst An ovarian cyst is a fluid-filled sac that forms on an ovary. The ovaries are small organs that produce eggs in women. Various types of cysts can form on the ovaries. Most are not cancerous. Many do not cause problems, and they often go away on their own. Some may cause symptoms and require treatment. Common types of ovarian cysts include:  Functional cysts I think this is what you have and had last time--if it does not improve, please come back in--These cysts may occur every month during the menstrual cycle. This is normal. The cysts usually go away with the next menstrual cycle if the woman does not get pregnant. Usually, there are no symptoms with a functional cyst.  Endometrioma cysts--These cysts form from the tissue that lines the uterus. They are also called "chocolate cysts" because they become filled with blood that turns brown. This type of cyst can cause pain in the lower abdomen during intercourse and with your menstrual period.  Cystadenoma cysts--This type develops from the cells on the outside of the ovary. These cysts can get very big and cause lower abdomen pain and pain with intercourse. This type of cyst can twist on itself, cut off its blood supply, and cause severe pain. It can also easily rupture and cause a lot of pain.  Dermoid cysts--This type of cyst is sometimes found in both ovaries. These cysts may contain different kinds of body tissue, such as skin, teeth, hair, or cartilage. They usually do not cause symptoms unless they get very big.  Theca lutein cysts--These cysts occur when too much of a certain hormone (human chorionic gonadotropin) is produced and overstimulates the ovaries to produce an egg. This is most common after procedures used to assist with the conception of a baby (in vitro fertilization). CAUSES   Fertility drugs can cause a condition in which multiple large cysts are formed on the ovaries. This is called ovarian hyperstimulation  syndrome.  A condition called polycystic ovary syndrome can cause hormonal imbalances that can lead to nonfunctional ovarian cysts. SIGNS AND SYMPTOMS  Many ovarian cysts do not cause symptoms. If symptoms are present, they may include:  Pelvic pain or pressure.  Pain in the lower abdomen.  Pain during sexual intercourse.  Increasing girth (swelling) of the abdomen.  Abnormal menstrual periods.  Increasing pain with menstrual periods.  Stopping having menstrual periods without being pregnant. DIAGNOSIS  These cysts are commonly found during a routine or annual pelvic exam. Tests may be ordered to find out more about the cyst. These tests may include:  Ultrasound.  X-ray of the pelvis.  CT scan.  MRI.  Blood tests. TREATMENT  Many ovarian cysts go away on their own without treatment. Your health care provider may want to check your cyst regularly for 2-3 months to see if it changes. For women in menopause, it is particularly important to monitor a cyst closely because of the higher rate of ovarian cancer in menopausal women. When treatment is needed, it may include any of the following:  A procedure to drain the cyst (aspiration). This may be done using a long needle and ultrasound. It can also be done through a laparoscopic procedure. This involves using a thin, lighted tube with a tiny camera on the end (laparoscope) inserted through a small incision.  Surgery to remove the whole cyst. This may be done using laparoscopic surgery or an open surgery involving a larger incision in the lower abdomen.  Hormone treatment or birth control  pills. These methods are sometimes used to help dissolve a cyst. HOME CARE INSTRUCTIONS   Only take over-the-counter or prescription medicines as directed by your health care provider.  Follow up with your health care provider as directed.  Get regular pelvic exams and Pap tests. SEEK MEDICAL CARE IF:   Your periods are late, irregular, or  painful, or they stop.  Your pelvic pain or abdominal pain does not go away.  Your abdomen becomes larger or swollen.  You have pressure on your bladder or trouble emptying your bladder completely.  You have pain during sexual intercourse.  You have feelings of fullness, pressure, or discomfort in your stomach.  You lose weight for no apparent reason.  You feel generally ill.  You become constipated.  You lose your appetite.  You develop acne.  You have an increase in body and facial hair.  You are gaining weight, without changing your exercise and eating habits.  You think you are pregnant. SEEK IMMEDIATE MEDICAL CARE IF:   You have increasing abdominal pain.  You feel sick to your stomach (nauseous), and you throw up (vomit).  You develop a fever that comes on suddenly.  You have abdominal pain during a bowel movement.  Your menstrual periods become heavier than usual. MAKE SURE YOU:  Understand these instructions.  Will watch your condition.  Will get help right away if you are not doing well or get worse. Document Released: 10/30/2005 Document Revised: 11/04/2013 Document Reviewed: 07/07/2013 [facility] Patient Information 2015 [place], [place]. This information is not intended to replace advice given to you by your health care provider. Make sure you discuss any questions you have with your health care provider.

## 2015-08-12 NOTE — Progress Notes (Signed)
C/O lower mid pelvic pain on and off for 2 weeks.

## 2015-08-24 ENCOUNTER — Telehealth: Payer: Self-pay | Admitting: Internal Medicine

## 2015-08-24 DIAGNOSIS — R197 Diarrhea, unspecified: Secondary | ICD-10-CM

## 2015-08-24 DIAGNOSIS — K52831 Collagenous colitis: Secondary | ICD-10-CM

## 2015-08-24 NOTE — Telephone Encounter (Signed)
Left message for patient to call back  

## 2015-08-24 NOTE — Telephone Encounter (Signed)
Patient with a history of collagenous colitis.  She is c/o "not feeling well".  Diarrhea x 6 today, much worse last week.  Now with 2 day history of blood in her stool.  She is on Lialda 2.4 gm daily.  No APP appts and you are full.  Please advise

## 2015-08-24 NOTE — Telephone Encounter (Signed)
Stop meloxicam - was atsrted after ED visit in Sept I think and can trigger a flare  Quickest way to treat this is usually w/ prednisone - she c/o bloating and weight gain on budesonide so may not go for that but we could try 2 wweks of prednisone 10 mg tabs 2qd x 4 d 1.5 qd x 4 d then 1 qd x 8d and stop # 22 tabs no refill  If she has had any recent Abx in past few months would also do C diff PCR  She can increase Colestid to 4 g bid also  appt next week APP or me if 1026 or 27 still open

## 2015-08-25 MED ORDER — COLESTIPOL HCL 1 G PO TABS: 4.0000 g | ORAL_TABLET | Freq: Two times a day (BID) | ORAL | Status: DC

## 2015-08-25 MED ORDER — PREDNISONE 10 MG PO TABS: ORAL_TABLET | ORAL | Status: DC

## 2015-08-25 NOTE — Telephone Encounter (Signed)
Patient notified rx sent for prednisone and colestid. Took antibiotics about 3 months ago.  She will come for c-diff Follow up scheduled for 09/09/15 2:45

## 2015-09-07 ENCOUNTER — Encounter: Payer: Self-pay | Admitting: Obstetrics & Gynecology

## 2015-09-07 ENCOUNTER — Ambulatory Visit (INDEPENDENT_AMBULATORY_CARE_PROVIDER_SITE_OTHER): Payer: PRIVATE HEALTH INSURANCE | Admitting: Obstetrics & Gynecology

## 2015-09-07 VITALS — BP 120/77 | HR 86 | Resp 18 | Wt 166.0 lb

## 2015-09-07 DIAGNOSIS — Z30014 Encounter for initial prescription of intrauterine contraceptive device: Secondary | ICD-10-CM | POA: Diagnosis not present

## 2015-09-07 DIAGNOSIS — Z01812 Encounter for preprocedural laboratory examination: Secondary | ICD-10-CM

## 2015-09-07 LAB — POCT URINE PREGNANCY: Preg Test, Ur: NEGATIVE

## 2015-09-07 MED ORDER — LEVONORGESTREL 20 MCG/24HR IU IUD
INTRAUTERINE_SYSTEM | Freq: Once | INTRAUTERINE | Status: AC
  Administered 2015-09-07: 14:00:00 via INTRAUTERINE

## 2015-09-07 NOTE — Progress Notes (Signed)
    GYNECOLOGY CLINIC PROCEDURE NOTE  Alison PealsRhonda W Clarke is a 34 y.o. [phone] here for Mirena IUD insertion. No GYN concerns.  Last pap smear was on 12/07/14 and was normal with negative HRHPV.  IUD Insertion Procedure Note Patient identified, informed consent performed, consent signed.   Discussed risks of irregular bleeding, cramping, infection, malpositioning or misplacement of the IUD outside the uterus which may require further procedure such as laparoscopy. Time out was performed.  Urine pregnancy test negative.  Speculum placed in the vagina.  Cervix visualized.  Cleaned with Betadine x 2.  Grasped anteriorly with a single tooth tenaculum.  Uterus sounded to 8 cm.  Mirena IUD placed per manufacturer's recommendations.  Strings trimmed to 2 cm. Tenaculum was removed, good hemostasis noted.  Patient tolerated procedure well.   Patient was given post-procedure instructions.  She was advised to have backup contraception for one week.  Patient was also asked to check IUD strings periodically and follow up in 4 weeks for IUD check.    Alison CollinsUGONNA  ANYANWU, MD, FACOG Attending Obstetrician & Gynecologist, [facility]

## 2015-09-07 NOTE — Patient Instructions (Signed)

## 2015-09-08 ENCOUNTER — Encounter: Payer: Self-pay | Admitting: *Deleted

## 2015-09-09 ENCOUNTER — Encounter: Payer: Self-pay | Admitting: Internal Medicine

## 2015-09-09 ENCOUNTER — Ambulatory Visit (INDEPENDENT_AMBULATORY_CARE_PROVIDER_SITE_OTHER): Payer: PRIVATE HEALTH INSURANCE | Admitting: Internal Medicine

## 2015-09-09 VITALS — BP 128/80 | HR 76 | Ht 60.5 in | Wt 167.1 lb

## 2015-09-09 DIAGNOSIS — K52831 Collagenous colitis: Secondary | ICD-10-CM | POA: Diagnosis not present

## 2015-09-09 NOTE — Progress Notes (Signed)
   Subjective:    Patient ID: Alison Clarke, female    DOB: 09-Apr-1981, 34 y.o.   MRN: [id number] Cc: diarrhea HPI The patient is here for follow-up of her collagenous colitis. She was originally diagnosed earlier this year and treated with Entocort but she stated bloated or cause a lot of problems like switch to Lialda. Colestipol was added. She was on some meloxicam a few months ago and seemed to get worse after that though she's not continuing to take it. I have her on a relatively short prednisone taper and she is improved but still having problems and says she has "constant diarrhea". She is not waking up in the middle the night however and so overall she is a little bit better. She is frustrated that she has to take so many locations. She notes a particular problem if she eats biscuits especially those from biscuit [place]. She says she is ready for a colonoscopy after that meaning she has terrible diarrhea.  Medications, allergies, past medical history, past surgical history, family history and social history are reviewed and updated in the EMR.  Review of Systems As above    Objective:   Physical Exam Obese no acute distress BP 128/80 mmHg  Pulse 76  Ht 5' 0.5" (1.537 m)  Wt 167 lb 2 oz (75.807 kg)  BMI 32.09 kg/m2  LMP 08/16/2015     Assessment & Plan:   1. Collagenous colitis      Try Gluten free diet, perhaps she is gluten sensitive . Her tissue transglutaminase antibody screening test was normal as was her IgA level. ? Lialda reaction - doubt that this is the case I think that the diarrhea is really consistent with what she had before so we'll continue that at this time Review other Txs, I have given her information about immunomodulators and Biologics which would be the next steps but perhaps dietary change and may make a difference Microscopic colitois tx guide given to the patient as well RTC 2-3 months  15 minutes time spent with patient > half in counseling  coordination of care

## 2015-09-09 NOTE — Patient Instructions (Addendum)
Try the Gluten Free diet and see if that helps your symptoms.     We are also giving you information on immunomodulators and biologics to read.  These would be other medicines to try.   Follow up with Dr Leone PayorGessner in 2-3 months.  Will call back to set up.   Increase your colestid as discussed to help with symptom control.     I appreciate the opportunity to care for you. Stan Headarl Gessner, MD, [facility]

## 2015-09-14 ENCOUNTER — Other Ambulatory Visit: Payer: Self-pay

## 2015-09-14 DIAGNOSIS — K52831 Collagenous colitis: Secondary | ICD-10-CM

## 2015-09-14 MED ORDER — MESALAMINE 1.2 G PO TBEC: 4.8000 g | DELAYED_RELEASE_TABLET | Freq: Every day | ORAL | Status: DC

## 2015-09-14 NOTE — Telephone Encounter (Signed)
Refill sent in as requested, noted in last office visit to continue Lialda.

## 2015-10-06 ENCOUNTER — Ambulatory Visit: Payer: PRIVATE HEALTH INSURANCE | Admitting: Obstetrics & Gynecology

## 2015-10-06 DIAGNOSIS — Z30431 Encounter for routine checking of intrauterine contraceptive device: Secondary | ICD-10-CM

## 2015-10-27 ENCOUNTER — Telehealth: Payer: Self-pay | Admitting: *Deleted

## 2015-10-27 ENCOUNTER — Ambulatory Visit (INDEPENDENT_AMBULATORY_CARE_PROVIDER_SITE_OTHER): Payer: PRIVATE HEALTH INSURANCE | Admitting: Certified Nurse Midwife

## 2015-10-27 ENCOUNTER — Encounter: Payer: Self-pay | Admitting: Certified Nurse Midwife

## 2015-10-27 VITALS — BP 126/73 | HR 70 | Wt 171.0 lb

## 2015-10-27 DIAGNOSIS — R102 Pelvic and perineal pain: Secondary | ICD-10-CM | POA: Diagnosis not present

## 2015-10-27 DIAGNOSIS — Z30431 Encounter for routine checking of intrauterine contraceptive device: Secondary | ICD-10-CM

## 2015-10-27 NOTE — Addendum Note (Signed)
Addended by: Illene BolusLEMMONS, LORI A on: 10/27/2015 02:32 PM   Modules accepted: Orders

## 2015-10-27 NOTE — Patient Instructions (Signed)
Pelvic Pain, Female Pelvic pain is pain felt below the belly button and between your hips. It can be caused by many different things. It is important to get help right away. This is especially true for severe, sharp, or unusual pain that comes on suddenly.  HOME CARE  Only take medicine as told by your doctor.  Rest as told by your doctor.  Eat a healthy diet, such as fruits, vegetables, and lean meats.  Drink enough fluids to keep your pee (urine) clear or pale yellow, or as told.  Avoid sex (intercourse) if it causes pain.  Apply warm or cold packs to your lower belly (abdomen). Use the type of pack that helps the pain.  Avoid situations that cause you stress.  Keep a journal to track your pain. Write down:  When the pain started.  Where it is located.  If there are things that seem to be related to the pain, such as food or your period.  Follow up with your doctor as told. GET HELP RIGHT AWAY IF:   You have heavy bleeding from the vagina.  You have more pelvic pain.  You feel lightheaded or pass out (faint).  You have chills.  You have pain when you pee or have blood in your pee.  You cannot stop having watery poop (diarrhea).  You cannot stop throwing up (vomiting).  You have a fever or lasting symptoms for more than 3 days.  You have a fever and your symptoms suddenly get worse.  You are being physically or sexually abused.  Your medicine does not help your pain.  You have fluid (discharge) coming from your vagina that is not normal. MAKE SURE YOU:  Understand these instructions.  Will watch your condition.  Will get help if you are not doing well or get worse.   This information is not intended to replace advice given to you by your health care provider. Make sure you discuss any questions you have with your health care provider.   Document Released: 04/17/2008 Document Revised: 11/20/2014 Document Reviewed: 02/19/2012 Elsevier Interactive Patient  Education 2016 [employer] Levonorgestrel intrauterine device (IUD) What is this medicine? LEVONORGESTREL IUD (LEE voe nor jes trel) is a contraceptive (birth control) device. The device is placed inside the uterus by a healthcare professional. It is used to prevent pregnancy and can also be used to treat heavy bleeding that occurs during your period. Depending on the device, it can be used for 3 to 5 years. This medicine may be used for other purposes; ask your health care provider or pharmacist if you have questions. What should I tell my health care provider before I take this medicine? They need to know if you have any of these conditions: -abnormal Pap smear -cancer of the breast, uterus, or cervix -diabetes -endometritis -genital or pelvic infection now or in the past -have more than one sexual partner or your partner has more than one partner -heart disease -history of an ectopic or tubal pregnancy -immune system problems -IUD in place -liver disease or tumor -problems with blood clots or take blood-thinners -use intravenous drugs -uterus of unusual shape -vaginal bleeding that has not been explained -an unusual or allergic reaction to levonorgestrel, other hormones, silicone, or polyethylene, medicines, foods, dyes, or preservatives -pregnant or trying to get pregnant -breast-feeding How should I use this medicine? This device is placed inside the uterus by a health care professional. Talk to your pediatrician regarding the use of this medicine in children.  Special care may be needed. Overdosage: If you think you have taken too much of this medicine contact a poison control center or emergency room at once. NOTE: This medicine is only for you. Do not share this medicine with others. What if I miss a dose? This does not apply. What may interact with this medicine? Do not take this medicine with any of the following medications: -amprenavir -bosentan -fosamprenavir This  medicine may also interact with the following medications: -aprepitant -barbiturate medicines for inducing sleep or treating seizures -bexarotene -griseofulvin -medicines to treat seizures like carbamazepine, ethotoin, felbamate, oxcarbazepine, phenytoin, topiramate -modafinil -pioglitazone -rifabutin -rifampin -rifapentine -some medicines to treat HIV infection like atazanavir, indinavir, lopinavir, nelfinavir, tipranavir, ritonavir -St. John's wort -warfarin This list may not describe all possible interactions. Give your health care provider a list of all the medicines, herbs, non-prescription drugs, or dietary supplements you use. Also tell them if you smoke, drink alcohol, or use illegal drugs. Some items may interact with your medicine. What should I watch for while using this medicine? Visit your doctor or health care professional for regular check ups. See your doctor if you or your partner has sexual contact with others, becomes HIV positive, or gets a sexual transmitted disease. This product does not protect you against HIV infection (AIDS) or other sexually transmitted diseases. You can check the placement of the IUD yourself by reaching up to the top of your vagina with clean fingers to feel the threads. Do not pull on the threads. It is a good habit to check placement after each menstrual period. Call your doctor right away if you feel more of the IUD than just the threads or if you cannot feel the threads at all. The IUD may come out by itself. You may become pregnant if the device comes out. If you notice that the IUD has come out use a backup birth control method like condoms and call your health care provider. Using tampons will not change the position of the IUD and are okay to use during your period. What side effects may I notice from receiving this medicine? Side effects that you should report to your doctor or health care professional as soon as possible: -allergic  reactions like skin rash, itching or hives, swelling of the face, lips, or tongue -fever, flu-like symptoms -genital sores -high blood pressure -no menstrual period for 6 weeks during use -pain, swelling, warmth in the leg -pelvic pain or tenderness -severe or sudden headache -signs of pregnancy -stomach cramping -sudden shortness of breath -trouble with balance, talking, or walking -unusual vaginal bleeding, discharge -yellowing of the eyes or skin Side effects that usually do not require medical attention (report to your doctor or health care professional if they continue or are bothersome): -acne -breast pain -change in sex drive or performance -changes in weight -cramping, dizziness, or faintness while the device is being inserted -headache -irregular menstrual bleeding within first 3 to 6 months of use -nausea This list may not describe all possible side effects. Call your doctor for medical advice about side effects. You may report side effects to FDA at 1-800-FDA-1088. Where should I keep my medicine? This does not apply. NOTE: This sheet is a summary. It may not cover all possible information. If you have questions about this medicine, talk to your doctor, pharmacist, or health care provider.    2016, Elsevier/Gold Standard. (2011-11-30 13:54:04)

## 2015-10-27 NOTE — Telephone Encounter (Signed)
-----   Message from [place] Dunes[place]inda S Battle sent at 10/27/2015  1:55 PM EST ----- Regarding: Med Mangament Wants to talk to someone about getting her Lamotrigine changed to something else, not sure why but is currently having issues with it

## 2015-10-27 NOTE — Progress Notes (Signed)
     GYNECOLOGY CLINIC PROGRESS NOTE  History:  34 y.o. [id number] here today for today for IUD string check; Mirena IUD was placed  4 weeks ago. C/o vaginal bleeding , back pain and pelvic pain  The following portions of the patient's history were reviewed and updated as appropriate: allergies, current medications, past family history, past medical history, past social history, past surgical history and problem list.  Review of Systems:  Pertinent items are noted in HPI.  Objective:  Physical Exam Blood pressure 126/73, pulse 70, weight 171 lb (77.565 kg). CONSTITUTIONAL: Well-developed, well-nourished female in no acute distress.  HENT:  Normocephalic, atraumatic. External right and left ear normal. Oropharynx is clear and moist EYES: Conjunctivae and EOM are normal. Pupils are equal, round, and reactive to light. No scleral icterus.  NECK: Normal range of motion, supple, no masses CARDIOVASCULAR: Normal heart rate noted RESPIRATORY: Effort and breath sounds normal, no problems with respiration noted ABDOMEN: Soft, no distention noted.   PELVIC: Normal appearing external genitalia; normal appearing vaginal mucosa and cervix.  IUD strings visualized, about 3 cm in length outside cervix.   Assessment & Plan:  Normal IUD check. Schedule OP GYN US for pelvic pain Patient to keep IUD in place for five years; can come in for removal if she desires pregnancy within the next five years. Routine preventative health maintenance measures emphasized.   Illene BolusLori Zackarie Chason CNM [facility]

## 2015-10-27 NOTE — Telephone Encounter (Signed)
Called pt, no answer, unable to leave message due to mailbox being full.

## 2015-11-03 ENCOUNTER — Ambulatory Visit: Admission: RE | Admit: 2015-11-03 | Payer: PRIVATE HEALTH INSURANCE | Source: Ambulatory Visit

## 2015-12-08 ENCOUNTER — Other Ambulatory Visit: Payer: Self-pay | Admitting: Unknown Physician Specialty

## 2016-05-14 ENCOUNTER — Emergency Department
Admission: EM | Admit: 2016-05-14 | Discharge: 2016-05-14 | Disposition: A | Discharge status: Home / Self Care | Payer: PRIVATE HEALTH INSURANCE | Attending: Emergency Medicine | Admitting: Emergency Medicine

## 2016-05-14 ENCOUNTER — Encounter: Payer: Self-pay | Admitting: *Deleted

## 2016-05-14 DIAGNOSIS — R55 Syncope and collapse: Secondary | ICD-10-CM

## 2016-05-14 DIAGNOSIS — F329 Major depressive disorder, single episode, unspecified: Secondary | ICD-10-CM | POA: Insufficient documentation

## 2016-05-14 DIAGNOSIS — Z79899 Other long term (current) drug therapy: Secondary | ICD-10-CM | POA: Diagnosis not present

## 2016-05-14 DIAGNOSIS — J45909 Unspecified asthma, uncomplicated: Secondary | ICD-10-CM | POA: Insufficient documentation

## 2016-05-14 DIAGNOSIS — E039 Hypothyroidism, unspecified: Secondary | ICD-10-CM | POA: Insufficient documentation

## 2016-05-14 DIAGNOSIS — R42 Dizziness and giddiness: Secondary | ICD-10-CM | POA: Diagnosis present

## 2016-05-14 LAB — COMPREHENSIVE METABOLIC PANEL
ALBUMIN: 4.4 g/dL (ref 3.5–5.0)
ALK PHOS: 48 U/L (ref 38–126)
ALT: 14 U/L (ref 14–54)
ANION GAP: 10 (ref 5–15)
AST: 29 U/L (ref 15–41)
BILIRUBIN TOTAL: 1.4 mg/dL — AB (ref 0.3–1.2)
BUN: 10 mg/dL (ref 6–20)
CALCIUM: 8.9 mg/dL (ref 8.9–10.3)
CO2: 20 mmol/L — AB (ref 22–32)
Chloride: 108 mmol/L (ref 101–111)
Creatinine, Ser: 0.68 mg/dL (ref 0.44–1.00)
GFR calc Af Amer: >60 mL/min (ref >60)
GFR calc non Af Amer: >60 mL/min (ref >60)
GLUCOSE: 100 mg/dL — AB (ref 65–99)
Potassium: 3.5 mmol/L (ref 3.5–5.1)
SODIUM: 138 mmol/L (ref 135–145)
Total Protein: 6.9 g/dL (ref 6.5–8.1)

## 2016-05-14 LAB — CBC
HCT: 37.5 % (ref 35.0–47.0)
HEMOGLOBIN: 13.1 g/dL (ref 12.0–16.0)
MCH: 31 pg (ref 26.0–34.0)
MCHC: 34.9 g/dL (ref 32.0–36.0)
MCV: 88.9 fL (ref 80.0–100.0)
Platelets: 280 10*3/uL (ref 150–440)
RBC: 4.22 MIL/uL (ref 3.80–5.20)
RDW: 13 % (ref 11.5–14.5)
WBC: 7.7 10*3/uL (ref 3.6–11.0)

## 2016-05-14 LAB — LIPASE, BLOOD: Lipase: 21 U/L (ref 11–51)

## 2016-05-14 MED ORDER — SODIUM CHLORIDE 0.9 % IV SOLN
Freq: Once | INTRAVENOUS | Status: AC
  Administered 2016-05-14: 500 mL via INTRAVENOUS

## 2016-05-14 NOTE — Discharge Instructions (Signed)

## 2016-05-14 NOTE — ED Provider Notes (Signed)
[facility] Emergency Department Provider Note  ____________________________________________    I have reviewed the triage vital signs and the nursing notes.   HISTORY  Chief Complaint Dizziness    HPI Alison Clarke is a 35 y.o. female who presents with complaints of dizziness, and possible near syncope today. Patient reports she was out in the heat by the pool and felt dehydrated and became slightly lightheaded so she sat down. She became more and more anxious and reports that her hands spasmed inwards bilaterally. Her husband put ice packs on her and she felt better within approximately 5 minutes. EMS gave normal saline 500 cc. Patient reports she feels significant better now. She feels these episodes have happened in the past. No chest pain or shortness of breath. No calf pain or swelling.     Past Medical History  Diagnosis Date  . Hypothyroidism   . IBS (irritable bowel syndrome)   . Kidney stones   . Anxiety   . Asthma   . Depression   . Allergic rhinitis   . Shingles   . Heart murmur     past hx of but not now   . Meningitis     age 35 -  . Collagenous colitis 02/25/2015    02/2015 colonoscopy  . Colitis   . Kidney stones     Patient Active Problem List   Diagnosis Date Noted  . Collagenous colitis 02/25/2015    Past Surgical History  Procedure Laterality Date  . Tonsillectomy    . Colonoscopy    . Wisdom tooth extraction      Current Outpatient Rx  Name  Route  Sig  Dispense  Refill  . colestipol (COLESTID) 1 G tablet   Oral   Take 4 tablets (4 g total) by mouth 2 (two) times daily.   240 tablet   3   . lamoTRIgine (LAMICTAL) 100 MG tablet   Oral   Take 100 mg by mouth daily.      1   . levonorgestrel (MIRENA, 52 MG,) 20 MCG/24HR IUD   Intrauterine   1 each by Intrauterine route once.         Marland Kitchen. levothyroxine (SYNTHROID, LEVOTHROID) 75 MCG tablet            0   . mesalamine (LIALDA) 1.2 G EC tablet   Oral  Take 4 tablets (4.8 g total) by mouth daily with breakfast.   120 tablet   5   . ondansetron (ZOFRAN-ODT) 4 MG disintegrating tablet   Oral   Take 4 mg by mouth every 8 (eight) hours as needed for nausea or vomiting (nausea).          . predniSONE (DELTASONE) 10 MG tablet      Take 20 mg by mouth for 4 days, 15 mg for 4 days, then 10 mg daily for 8 days then stop   22 tablet   0     Allergies Morphine and related; Sulfur; and Vicodin  Family History  Problem Relation Age of Onset  . Hypertension Mother   . Hypertension Father   . Liver cancer Father   . Alcohol abuse Father   . Cirrhosis Father   . Breast cancer Paternal Aunt   . Colon cancer Neg Hx   . Esophageal cancer Neg Hx   . Rectal cancer Neg Hx     Social History Social History  Substance Use Topics  . Smoking status: Never Smoker   . Smokeless  tobacco: Never Used  . Alcohol Use: 0.0 oz/week    0 Standard drinks or equivalent per week     Comment: rare    Review of Systems  Constitutional:Dizziness as above Eyes: Negative for redness ENT: Negative for sore throat Cardiovascular: Negative for chest pain, no palpitations Respiratory: Negative for shortness of breath. Gastrointestinal: Negative for abdominal pain, mild nausea Genitourinary: Patient denies chance of pregnancy Musculoskeletal: Negative for back pain. Skin: Negative for rash. Neurological: Negative for focal weakness, No headache Psychiatric: As above    ____________________________________________   PHYSICAL EXAM:  VITAL SIGNS: ED Triage Vitals  Enc Vitals Group     BP 05/14/16 1617 140/76 mmHg     Pulse Rate 05/14/16 1617 101     Resp 05/14/16 1617 20     Temp 05/14/16 1617 97.7 F (36.5 C)     Temp Source 05/14/16 1617 Oral     SpO2 05/14/16 1617 97 %     Weight 05/14/16 1617 160 lb (72.576 kg)     Height 05/14/16 1617 5\' 2"  (1.575 m)     Head Cir --      Peak Flow --      Pain Score --      Pain Loc --      Pain  Edu? --      Excl. in GC? --      Constitutional: Alert and oriented. Well appearing and in no distress.  Eyes: Conjunctivae are normal. No erythema or injection ENT   Head: Normocephalic and atraumatic.   Mouth/Throat: Mucous membranes are moist. Cardiovascular: Normal rate, regular rhythm. Normal and symmetric distal pulses are present in the upper extremities.  Respiratory: Normal respiratory effort without tachypnea nor retractions. Breath sounds are clear and equal bilaterally.  Gastrointestinal: Soft and non-tender in all quadrants. No distention. There is no CVA tenderness. Genitourinary: deferred Musculoskeletal: Nontender with normal range of motion in all extremities. No lower extremity tenderness nor edema. Neurologic:  Normal speech and language. No gross focal neurologic deficits are appreciated. Skin:  Skin is warm, dry and intact. No rash noted. Psychiatric: Mood and affect are normal. Patient exhibits appropriate insight and judgment.  ____________________________________________    LABS (pertinent positives/negatives)  Labs Reviewed  CBC  LIPASE, BLOOD  COMPREHENSIVE METABOLIC PANEL  URINALYSIS COMPLETEWITH MICROSCOPIC ([facility] ONLY)    ____________________________________________   EKG  ED ECG REPORT I, Jene EveryKINNER, Lucila Klecka, the attending physician, personally viewed and interpreted this ECG.  Date: 05/14/2016 EKG Time: 4:21 PM Rate: 94 Rhythm: normal sinus rhythm QRS Axis: normal Intervals: normal ST/T Wave abnormalities: normal Conduction Disturbances: none Narrative Interpretation: unremarkable   ____________________________________________    RADIOLOGY  None  ____________________________________________   PROCEDURES  Procedure(s) performed: none  Critical Care performed:none  ____________________________________________   INITIAL IMPRESSION / ASSESSMENT AND PLAN / ED COURSE  Pertinent labs & imaging results that were available  during my care of the patient were reviewed by me and considered in my medical decision making (see chart for details).  Patient well-appearing and in no distress. History of present illness is most consistent with dehydration/dizziness with possible anxiety-related carpopedal spasm. She is at her baseline now. We will give IV fluids, check labs and reevaluate.  ----------------------------------------- 6:17 PM on 05/14/2016 -----------------------------------------  Lab work is unremarkable. Patient feels much better after IV fluids. She is asymptomatic. Feel she is appropriate for outpatient discharge and PCP follow-up as needed. Return precautions discussed  ____________________________________________   FINAL CLINICAL IMPRESSION(S) / ED DIAGNOSES  Final  diagnoses:  Near syncope          Jene Every, MD 05/14/16 1818

## 2016-05-14 NOTE — ED Notes (Signed)
Pt had been at the pool today, pt became dizzy, hands started to cramp, pt reports not eating today, pt also reports nausea and vomiting, pt brought in by EMS with a liter of NS infusing

## 2016-05-14 NOTE — ED Notes (Signed)
Patient was at pool and she felt very dizzy, patients fingers on her left hand locked up and then the fingers on the right locked up. Patient states that her tongue swelled up and she was not able to talk, patient states that once EMS started her on IV fluids her symptoms started to resolve. When she was take off of IV fluids here in the ER the symptoms started back. Patient was started back fluids and is currently not experiencing any symptoms.   Patient was given IV Zofran by EMS

## 2016-07-15 ENCOUNTER — Encounter: Payer: Self-pay | Admitting: Gynecology

## 2016-07-15 ENCOUNTER — Ambulatory Visit
Admission: EM | Admit: 2016-07-15 | Discharge: 2016-07-15 | Disposition: A | Discharge status: Home / Self Care | Payer: PRIVATE HEALTH INSURANCE | Attending: Family Medicine | Admitting: Family Medicine

## 2016-07-15 DIAGNOSIS — G44209 Tension-type headache, unspecified, not intractable: Secondary | ICD-10-CM | POA: Diagnosis not present

## 2016-07-15 DIAGNOSIS — S161XXA Strain of muscle, fascia and tendon at neck level, initial encounter: Secondary | ICD-10-CM | POA: Diagnosis not present

## 2016-07-15 MED ORDER — CYCLOBENZAPRINE HCL 10 MG PO TABS: 10.0000 mg | ORAL_TABLET | Freq: Three times a day (TID) | ORAL | 0 refills | Status: DC | PRN

## 2016-07-15 NOTE — ED Triage Notes (Signed)
Patient c/o headache on and off x 1 month. Pt. Also stated posterior of neck felt swollen x couple days.

## 2016-07-15 NOTE — ED Provider Notes (Signed)
[facility]    CSN: [id number] Arrival date & time: 07/15/16  1124  First Provider Contact:  First MD Initiated Contact with Patient 07/15/16 1330        History   Chief Complaint Chief Complaint  Patient presents with  . Headache    HPI Alison Clarke is a 35 y.o. female.   The history is provided by the patient.  Patient c/o headache on and off x 1 month. Pt. Also stated posterior of neck felt swollen x couple days. Denies any fevers, chills, vomiting, numbness/tingling, neck stiffness, falls/direct trauma.   Past Medical History:  Diagnosis Date  . Allergic rhinitis   . Anxiety   . Asthma   . Colitis   . Collagenous colitis 02/25/2015   02/2015 colonoscopy  . Depression   . Heart murmur    past hx of but not now   . Hypothyroidism   . IBS (irritable bowel syndrome)   . Kidney stones   . Kidney stones   . Meningitis    age 51 -  . Shingles     Patient Active Problem List   Diagnosis Date Noted  . Collagenous colitis 02/25/2015    Past Surgical History:  Procedure Laterality Date  . COLONOSCOPY    . TONSILLECTOMY    . WISDOM TOOTH EXTRACTION      OB History    Gravida Para Term Preterm AB Living   3 1 1   2 1    SAB TAB Ectopic Multiple Live Births   1 1             Home Medications    Prior to Admission medications   Medication Sig Start Date End Date Taking? Authorizing Provider  DULoxetine (CYMBALTA) 30 MG capsule Take 30 mg by mouth daily.   Yes Historical Provider, MD  levonorgestrel (MIRENA, 52 MG,) 20 MCG/24HR IUD 1 each by Intrauterine route once.   Yes Historical Provider, MD  levothyroxine (SYNTHROID, LEVOTHROID) 75 MCG tablet  04/04/15  Yes Historical Provider, MD  ondansetron (ZOFRAN-ODT) 4 MG disintegrating tablet Take 4 mg by mouth every 8 (eight) hours as needed for nausea or vomiting (nausea).    Yes Historical Provider, MD  colestipol (COLESTID) 1 G tablet Take 4 tablets (4 g total) by mouth 2 (two) times daily.  08/25/15   Iva Boop, MD  cyclobenzaprine (FLEXERIL) 10 MG tablet Take 1 tablet (10 mg total) by mouth 3 (three) times daily as needed for muscle spasms. 07/15/16   Payton Mccallum, MD  lamoTRIgine (LAMICTAL) 100 MG tablet Take 100 mg by mouth daily. 12/02/14   Historical Provider, MD  mesalamine (LIALDA) 1.2 G EC tablet Take 4 tablets (4.8 g total) by mouth daily with breakfast. 09/14/15   Iva Boop, MD  predniSONE (DELTASONE) 10 MG tablet Take 20 mg by mouth for 4 days, 15 mg for 4 days, then 10 mg daily for 8 days then stop 08/25/15   Iva Boop, MD    Family History Family History  Problem Relation Age of Onset  . Hypertension Mother   . Hypertension Father   . Liver cancer Father   . Alcohol abuse Father   . Cirrhosis Father   . Breast cancer Paternal Aunt   . Colon cancer Neg Hx   . Esophageal cancer Neg Hx   . Rectal cancer Neg Hx     Social History Social History  Substance Use Topics  . Smoking status: Never Smoker  .  Smokeless tobacco: Never Used  . Alcohol use 0.0 oz/week     Comment: rare     Allergies   Morphine and related; Sulfur; and Vicodin [hydrocodone-acetaminophen]   Review of Systems Review of Systems   Physical Exam Triage Vital Signs ED Triage Vitals  Enc Vitals Group     BP 07/15/16 1316 110/76     Pulse Rate 07/15/16 1316 70     Resp 07/15/16 1316 16     Temp 07/15/16 1316 98.4 F (36.9 C)     Temp Source 07/15/16 1316 Oral     SpO2 07/15/16 1316 100 %     Weight 07/15/16 1318 155 lb (70.3 kg)     Height 07/15/16 1318 5\' 2"  (1.575 m)     Head Circumference --      Peak Flow --      Pain Score 07/15/16 1320 6     Pain Loc --      Pain Edu? --      Excl. in GC? --    No data found.   Updated Vital Signs BP 110/76 (BP Location: Left Arm)   Pulse 70   Temp 98.4 F (36.9 C) (Oral)   Resp 16   Ht 5\' 2"  (1.575 m)   Wt 155 lb (70.3 kg)   SpO2 100%   BMI 28.35 kg/m   Visual Acuity Right Eye Distance:   Left Eye  Distance:   Bilateral Distance:    Right Eye Near:   Left Eye Near:    Bilateral Near:     Physical Exam  Constitutional: She is oriented to person, place, and time. She appears well-developed and well-nourished. No distress.  HENT:  Head: Normocephalic.  Right Ear: Tympanic membrane, external ear and ear canal normal.  Left Ear: Tympanic membrane, external ear and ear canal normal.  Nose: Nose normal.  Mouth/Throat: Oropharynx is clear and moist and mucous membranes are normal.  Eyes: Conjunctivae and EOM are normal. Pupils are equal, round, and reactive to light. Right eye exhibits no discharge. Left eye exhibits no discharge. No scleral icterus.  Neck: Normal range of motion. Neck supple. No JVD present. No tracheal deviation present. No thyromegaly present.  Cardiovascular: Normal rate, regular rhythm, normal heart sounds and intact distal pulses.   No murmur heard. Pulmonary/Chest: Effort normal and breath sounds normal. No stridor. No respiratory distress. She has no wheezes. She has no rales. She exhibits no tenderness.  Musculoskeletal: She exhibits no edema.       Thoracic back: She exhibits tenderness (over the thoracic paraspinous muscles and trapezius muscles bilaterally (left greater than right)) and spasm. She exhibits normal range of motion, no bony tenderness, no swelling, no deformity, no laceration and normal pulse.  Lymphadenopathy:    She has no cervical adenopathy.  Neurological: She is alert and oriented to person, place, and time. She has normal reflexes. She displays normal reflexes. No cranial nerve deficit. She exhibits normal muscle tone. Coordination normal.  Skin: Skin is warm and dry. No rash noted. She is not diaphoretic. No erythema. No pallor.  Psychiatric: She has a normal mood and affect. Her behavior is normal. Judgment and thought content normal.  Nursing note and vitals reviewed.    UC Treatments / Results  Labs (all labs ordered are listed, but  only abnormal results are displayed) Labs Reviewed - No data to display  EKG  EKG Interpretation None       Radiology No results found.  Procedures Procedures (  including critical care time)  Medications Ordered in UC Medications - No data to display   Initial Impression / Assessment and Plan / UC Course  I have reviewed the triage vital signs and the nursing notes.  Pertinent labs & imaging results that were available during my care of the patient were reviewed by me and considered in my medical decision making (see chart for details).  Clinical Course     Final Clinical Impressions(s) / UC Diagnoses   Final diagnoses:  Neck strain, initial encounter  Tension-type headache, not intractable, unspecified chronicity pattern    New Prescriptions Discharge Medication List as of 07/15/2016  1:38 PM    START taking these medications   Details  cyclobenzaprine (FLEXERIL) 10 MG tablet Take 1 tablet (10 mg total) by mouth 3 (three) times daily as needed for muscle spasms., Starting Sat 07/15/2016, Normal       1. diagnosis reviewed with patient 2. rx as per orders above; reviewed possible side effects, interactions, risks and benefits  3. Recommend supportive treatment with warm compresses; stretches  4. Follow-up prn if symptoms worsen or don't improve   Payton Mccallumrlando Lucillia Corson, MD 07/15/16 1411

## 2016-09-06 ENCOUNTER — Ambulatory Visit
Admission: EM | Admit: 2016-09-06 | Discharge: 2016-09-06 | Disposition: A | Discharge status: Home / Self Care | Payer: PRIVATE HEALTH INSURANCE | Attending: Family Medicine | Admitting: Family Medicine

## 2016-09-06 DIAGNOSIS — N3001 Acute cystitis with hematuria: Secondary | ICD-10-CM | POA: Diagnosis not present

## 2016-09-06 LAB — URINALYSIS COMPLETE WITH MICROSCOPIC (ARMC ONLY)
Bilirubin Urine: NEGATIVE
GLUCOSE, UA: NEGATIVE mg/dL
Ketones, ur: NEGATIVE mg/dL
NITRITE: NEGATIVE
PROTEIN: NEGATIVE mg/dL
SPECIFIC GRAVITY, URINE: 1.02 (ref 1.005–1.030)
pH: 6 (ref 5.0–8.0)

## 2016-09-06 MED ORDER — OXYCODONE-ACETAMINOPHEN 5-325 MG PO TABS: ORAL_TABLET | ORAL | 0 refills | Status: DC

## 2016-09-06 MED ORDER — SULFAMETHOXAZOLE-TRIMETHOPRIM 800-160 MG PO TABS: 1.0000 | ORAL_TABLET | Freq: Two times a day (BID) | ORAL | 0 refills | Status: DC

## 2016-09-06 NOTE — ED Triage Notes (Signed)
Pt c/o of lower pelvic pain for the last week, she does have a history of kidney stones.

## 2016-09-06 NOTE — ED Provider Notes (Signed)
[facility]    CSN: [id number] Arrival date & time: 09/06/16  1201     History   Chief Complaint Chief Complaint  Patient presents with  . Abdominal Pain    right side, lower pelvic area    HPI Alison Clarke is a 35 y.o. female.   35 yo female with a  c/o of left lower pelvic pain for the last week. States symptoms feel similar to previous kidney stone episodes. Denies any trauma, fevers, chills, chest pains, shortness of breath, vaginal discharge or bleeding.    The history is provided by the patient.    Past Medical History:  Diagnosis Date  . Allergic rhinitis   . Anxiety   . Asthma   . Colitis   . Collagenous colitis 02/25/2015   02/2015 colonoscopy  . Depression   . Heart murmur    past hx of but not now   . Hypothyroidism   . IBS (irritable bowel syndrome)   . Kidney stones   . Kidney stones   . Meningitis    age 89 -  . Shingles     Patient Active Problem List   Diagnosis Date Noted  . Collagenous colitis 02/25/2015    Past Surgical History:  Procedure Laterality Date  . COLONOSCOPY    . TONSILLECTOMY    . WISDOM TOOTH EXTRACTION      OB History    Gravida Para Term Preterm AB Living   3 1 1   2 1    SAB TAB Ectopic Multiple Live Births   1 1             Home Medications    Prior to Admission medications   Medication Sig Start Date End Date Taking? Authorizing Provider  colestipol (COLESTID) 1 G tablet Take 4 tablets (4 g total) by mouth 2 (two) times daily. Patient not taking: Reported on 09/07/2016 08/25/15  Yes Iva Booparl E Gessner, MD  lamoTRIgine (LAMICTAL) 100 MG tablet Take 25 mg by mouth every evening.  12/02/14  Yes Historical Provider, MD  levonorgestrel (MIRENA, 52 MG,) 20 MCG/24HR IUD 1 each by Intrauterine route once.   Yes Historical Provider, MD  levothyroxine (SYNTHROID, LEVOTHROID) 75 MCG tablet Take 75 mcg by mouth daily before breakfast.  04/04/15  Yes Historical Provider, MD  mesalamine (LIALDA) 1.2 G EC tablet  Take 4 tablets (4.8 g total) by mouth daily with breakfast. Patient not taking: Reported on 09/07/2016 09/14/15  Yes Iva Booparl E Gessner, MD  cyclobenzaprine (FLEXERIL) 10 MG tablet Take 1 tablet (10 mg total) by mouth 3 (three) times daily as needed for muscle spasms. Patient not taking: Reported on 09/07/2016 07/15/16   Payton Mccallumrlando Sallie Staron, MD  DULoxetine (CYMBALTA) 30 MG capsule Take 30 mg by mouth daily.    Historical Provider, MD  naproxen (NAPROSYN) 500 MG tablet Take 1 tablet (500 mg total) by mouth 2 (two) times daily. 09/07/16   Rolland PorterMark James, MD  nitrofurantoin (MACRODANTIN) 100 MG capsule Take 100 mg by mouth 2 (two) times daily. ABT Start Date 09/06/16 & End Date 09/14/16.    Historical Provider, MD  ondansetron (ZOFRAN ODT) 4 MG disintegrating tablet Take 1 tablet (4 mg total) by mouth every 8 (eight) hours as needed for nausea. 09/07/16   Rolland PorterMark James, MD  oxyCODONE-acetaminophen (PERCOCET/ROXICET) 5-325 MG tablet 1-2 tabs po q 8 hours prn Patient not taking: Reported on 09/07/2016 09/06/16   Payton Mccallumrlando Darlisha Kelm, MD  predniSONE (DELTASONE) 10 MG tablet Take 20 mg by mouth  for 4 days, 15 mg for 4 days, then 10 mg daily for 8 days then stop Patient not taking: Reported on 09/07/2016 08/25/15   Iva Boop, MD  sulfamethoxazole-trimethoprim (BACTRIM DS,SEPTRA DS) 800-160 MG tablet Take 1 tablet by mouth 2 (two) times daily. Patient not taking: Reported on 09/07/2016 09/06/16   Payton Mccallum, MD    Family History Family History  Problem Relation Age of Onset  . Hypertension Mother   . Hypertension Father   . Liver cancer Father   . Alcohol abuse Father   . Cirrhosis Father   . Breast cancer Paternal Aunt   . Colon cancer Neg Hx   . Esophageal cancer Neg Hx   . Rectal cancer Neg Hx     Social History Social History  Substance Use Topics  . Smoking status: Never Smoker  . Smokeless tobacco: Never Used  . Alcohol use 0.0 oz/week     Comment: rare     Allergies   Morphine and related; Sulfur;  and Vicodin [hydrocodone-acetaminophen]   Review of Systems Review of Systems   Physical Exam Triage Vital Signs ED Triage Vitals  Enc Vitals Group     BP 09/06/16 1227 (!) 123/58     Pulse Rate 09/06/16 1227 88     Resp 09/06/16 1227 18     Temp 09/06/16 1227 98 F (36.7 C)     Temp Source 09/06/16 1227 Oral     SpO2 09/06/16 1227 100 %     Weight 09/06/16 1225 155 lb (70.3 kg)     Height 09/06/16 1225 5\' 1"  (1.549 m)     Head Circumference --      Peak Flow --      Pain Score 09/06/16 1228 5     Pain Loc --      Pain Edu? --      Excl. in GC? --    No data found.   Updated Vital Signs BP (!) 123/58 (BP Location: Left Arm)   Pulse 88   Temp 98 F (36.7 C) (Oral)   Resp 18   Ht 5\' 1"  (1.549 m)   Wt 155 lb (70.3 kg)   SpO2 100%   BMI 29.29 kg/m   Visual Acuity Right Eye Distance:   Left Eye Distance:   Bilateral Distance:    Right Eye Near:   Left Eye Near:    Bilateral Near:     Physical Exam  Constitutional: She appears well-developed.  Cardiovascular: Normal rate, regular rhythm, normal heart sounds and intact distal pulses.   No murmur heard. Pulmonary/Chest: Effort normal and breath sounds normal. No respiratory distress. She has no wheezes. She has no rales.  Abdominal: Soft. Bowel sounds are normal. She exhibits no distension and no mass. There is tenderness (mild left lower abdomen). There is no rebound and no guarding.  Neurological: She is alert.  Skin: Skin is warm and dry. No rash noted. She is not diaphoretic. No erythema.  Nursing note and vitals reviewed.    UC Treatments / Results  Labs (all labs ordered are listed, but only abnormal results are displayed) Labs Reviewed  URINALYSIS COMPLETEWITH MICROSCOPIC ([facility] ONLY) - Abnormal; Notable for the following:       Result Value   APPearance HAZY (*)    Hgb urine dipstick TRACE (*)    Leukocytes, UA TRACE (*)    Bacteria, UA MANY (*)    Squamous Epithelial / LPF 6-30 (*)    All  other  components within normal limits    EKG  EKG Interpretation None       Radiology Ct Renal Stone Study  Result Date: 09/12/2016 CLINICAL DATA:  Right flank pain, right lower quadrant pain for 1 week. Hematuria. EXAM: CT ABDOMEN AND PELVIS WITHOUT CONTRAST TECHNIQUE: Multidetector CT imaging of the abdomen and pelvis was performed following the standard protocol without IV contrast. COMPARISON:  None. FINDINGS: Lower chest: No acute abnormality. Hepatobiliary: No focal liver abnormality is seen. No gallstones, gallbladder wall thickening, or biliary dilatation. Pancreas: Unremarkable. No pancreatic ductal dilatation or surrounding inflammatory changes. Spleen: Normal in size without focal abnormality. Adrenals/Urinary Tract: Normal adrenal glands. Bilateral punctate nonobstructing renal calculi. No ureteral calculi. No renal mass. No bladder calculi. Normal bladder. Stomach/Bowel: No bowel wall thickening or dilatation. No pneumatosis, pneumoperitoneum or portal venous gas. Normal appendix. Vascular/Lymphatic: Normal caliber abdominal aorta. No lymphadenopathy. Reproductive: Uterus and bilateral adnexa are unremarkable. Intrauterine device in satisfactory position. Other: No fluid collection or hematoma. Musculoskeletal: No acute or significant osseous findings. IMPRESSION: 1. Bilateral nonobstructing nephrolithiasis.  No ureterolithiasis. Electronically Signed   By: Elige Ko   On: 09/12/2016 11:35    Procedures Procedures (including critical care time)  Medications Ordered in UC Medications - No data to display   Initial Impression / Assessment and Plan / UC Course  I have reviewed the triage vital signs and the nursing notes.  Pertinent labs & imaging results that were available during my care of the patient were reviewed by me and considered in my medical decision making (see chart for details).  Clinical Course     Final Clinical Impressions(s) / UC Diagnoses   Final  diagnoses:  Acute cystitis with hematuria    New Prescriptions Discharge Medication List as of 09/06/2016  1:06 PM    START taking these medications   Details  oxyCODONE-acetaminophen (PERCOCET/ROXICET) 5-325 MG tablet 1-2 tabs po q 8 hours prn, Print    sulfamethoxazole-trimethoprim (BACTRIM DS,SEPTRA DS) 800-160 MG tablet Take 1 tablet by mouth 2 (two) times daily., Starting Wed 09/06/2016, Normal       1. Lab results and diagnosis reviewed with patient 2. rx as per orders above; reviewed possible side effects, interactions, risks and benefits  3. Recommend supportive treatment with increased fluids 4. Follow-up prn if symptoms worsen or don't improve   Payton Mccallum, MD 09/13/16 [phone]

## 2016-09-07 ENCOUNTER — Encounter (HOSPITAL_COMMUNITY): Payer: Self-pay | Admitting: Emergency Medicine

## 2016-09-07 ENCOUNTER — Emergency Department (HOSPITAL_COMMUNITY)
Admission: EM | Admit: 2016-09-07 | Discharge: 2016-09-07 | Disposition: A | Discharge status: Home / Self Care | Payer: PRIVATE HEALTH INSURANCE | Attending: Emergency Medicine | Admitting: Emergency Medicine

## 2016-09-07 DIAGNOSIS — M545 Low back pain, unspecified: Secondary | ICD-10-CM

## 2016-09-07 DIAGNOSIS — R110 Nausea: Secondary | ICD-10-CM | POA: Insufficient documentation

## 2016-09-07 DIAGNOSIS — Z79899 Other long term (current) drug therapy: Secondary | ICD-10-CM | POA: Diagnosis not present

## 2016-09-07 DIAGNOSIS — R1031 Right lower quadrant pain: Secondary | ICD-10-CM | POA: Insufficient documentation

## 2016-09-07 DIAGNOSIS — E039 Hypothyroidism, unspecified: Secondary | ICD-10-CM | POA: Insufficient documentation

## 2016-09-07 DIAGNOSIS — J45909 Unspecified asthma, uncomplicated: Secondary | ICD-10-CM | POA: Insufficient documentation

## 2016-09-07 LAB — COMPREHENSIVE METABOLIC PANEL
ALBUMIN: 4.1 g/dL (ref 3.5–5.0)
ALK PHOS: 50 U/L (ref 38–126)
ALT: 14 U/L (ref 14–54)
ANION GAP: 5 (ref 5–15)
AST: 18 U/L (ref 15–41)
BILIRUBIN TOTAL: 0.7 mg/dL (ref 0.3–1.2)
BUN: 12 mg/dL (ref 6–20)
CALCIUM: 8.6 mg/dL — AB (ref 8.9–10.3)
CO2: 27 mmol/L (ref 22–32)
CREATININE: 0.47 mg/dL (ref 0.44–1.00)
Chloride: 107 mmol/L (ref 101–111)
GFR calc Af Amer: >60 mL/min (ref >60)
GFR calc non Af Amer: >60 mL/min (ref >60)
GLUCOSE: 89 mg/dL (ref 65–99)
Potassium: 3.5 mmol/L (ref 3.5–5.1)
SODIUM: 139 mmol/L (ref 135–145)
TOTAL PROTEIN: 6.8 g/dL (ref 6.5–8.1)

## 2016-09-07 LAB — CBC
HCT: 35.3 % — ABNORMAL LOW (ref 36.0–46.0)
Hemoglobin: 11.9 g/dL — ABNORMAL LOW (ref 12.0–15.0)
MCH: 30.4 pg (ref 26.0–34.0)
MCHC: 33.7 g/dL (ref 30.0–36.0)
MCV: 90.3 fL (ref 78.0–100.0)
PLATELETS: 275 10*3/uL (ref 150–400)
RBC: 3.91 MIL/uL (ref 3.87–5.11)
RDW: 12.8 % (ref 11.5–15.5)
WBC: 7.7 10*3/uL (ref 4.0–10.5)

## 2016-09-07 LAB — URINALYSIS, ROUTINE W REFLEX MICROSCOPIC
Bilirubin Urine: NEGATIVE
GLUCOSE, UA: NEGATIVE mg/dL
Hgb urine dipstick: NEGATIVE
KETONES UR: NEGATIVE mg/dL
LEUKOCYTES UA: NEGATIVE
NITRITE: NEGATIVE
PH: 6 (ref 5.0–8.0)
Protein, ur: NEGATIVE mg/dL
SPECIFIC GRAVITY, URINE: 1.028 (ref 1.005–1.030)

## 2016-09-07 LAB — TROPONIN I

## 2016-09-07 LAB — LIPASE, BLOOD: Lipase: 23 U/L (ref 11–51)

## 2016-09-07 MED ORDER — NAPROXEN 500 MG PO TABS: 500.0000 mg | ORAL_TABLET | Freq: Two times a day (BID) | ORAL | 0 refills | Status: DC

## 2016-09-07 MED ORDER — KETOROLAC TROMETHAMINE 30 MG/ML IJ SOLN
30.0000 mg | Freq: Once | INTRAMUSCULAR | Status: AC
  Administered 2016-09-07: 30 mg via INTRAVENOUS
  Filled 2016-09-07: qty 1

## 2016-09-07 MED ORDER — ONDANSETRON HCL 4 MG/2ML IJ SOLN
4.0000 mg | Freq: Once | INTRAMUSCULAR | Status: AC
  Administered 2016-09-07: 4 mg via INTRAVENOUS
  Filled 2016-09-07: qty 2

## 2016-09-07 MED ORDER — ONDANSETRON 4 MG PO TBDP: 4.0000 mg | ORAL_TABLET | Freq: Three times a day (TID) | ORAL | 0 refills | Status: DC | PRN

## 2016-09-07 NOTE — ED Notes (Signed)
Pt refused vitals and signature.

## 2016-09-07 NOTE — ED Triage Notes (Signed)
Pt states she has had left sided flank pain for the past couple of weeks (which is not present today), but was seen at her PCP yesterday for RLQ pain radiating to the right flank area.  States she tested positive for a UTI and MD stated that her pain was more kidney pain that abdominal pain.  Pt states she feels like she is swollen on that side and feels "bruised"

## 2016-09-07 NOTE — Discharge Instructions (Signed)
Continue your current prescribed medicines at their current dosages.  Rest, push fluids, stay hydrated.  Your testing tonight an emergent did not suggest urine tract infection or stone. No other indications of acute bacterial infection.  Likely a viral infection. These can last 2 days to 2 weeks.  Follow-up with yourprimary care physician not improving.  Naproxen for body aches. Zofran for nausea.

## 2016-09-08 NOTE — ED Provider Notes (Signed)
[facility]-EMERGENCY DEPT Provider Note   CSN: [id number] Arrival date & time: 09/07/16  1715     History   Chief Complaint Chief Complaint  Patient presents with  . Flank Pain  . Abdominal Pain    HPI Alison Clarke is a 35 y.o. female. Patient presents for evaluation of right flank pain. States she's had some intermittent symptoms over the last few weeks. Is not present today. Seen by primary care physician yesterday. States was a right lower quadrant then but not now. States she was told she had a urinary tract infection. Was given antibiotic which she has not taken. States she feels "bruised" in her right flank pain also she has a headache. Then reiterates that she stopped taking her Cymbalta. Had pelvic pain for the last several weeks no vaginal bleeding or discharge. No vomiting. Multiple nonspecific additional complaints.  HPI  Past Medical History:  Diagnosis Date  . Allergic rhinitis   . Anxiety   . Asthma   . Colitis   . Collagenous colitis 02/25/2015   02/2015 colonoscopy  . Depression   . Heart murmur    past hx of but not now   . Hypothyroidism   . IBS (irritable bowel syndrome)   . Kidney stones   . Kidney stones   . Meningitis    age 73 -  . Shingles     Patient Active Problem List   Diagnosis Date Noted  . Collagenous colitis 02/25/2015    Past Surgical History:  Procedure Laterality Date  . COLONOSCOPY    . TONSILLECTOMY    . WISDOM TOOTH EXTRACTION      OB History    Gravida Para Term Preterm AB Living   3 1 1   2 1    SAB TAB Ectopic Multiple Live Births   1 1             Home Medications    Prior to Admission medications   Medication Sig Start Date End Date Taking? Authorizing Provider  DULoxetine (CYMBALTA) 30 MG capsule Take 30 mg by mouth daily.   Yes Historical Provider, MD  lamoTRIgine (LAMICTAL) 100 MG tablet Take 25 mg by mouth every evening.  12/02/14  Yes Historical Provider, MD  levothyroxine (SYNTHROID, LEVOTHROID) 75 MCG  tablet Take 75 mcg by mouth daily before breakfast.  04/04/15  Yes Historical Provider, MD  nitrofurantoin (MACRODANTIN) 100 MG capsule Take 100 mg by mouth 2 (two) times daily. ABT Start Date 09/06/16 & End Date 09/14/16.   Yes Historical Provider, MD  colestipol (COLESTID) 1 G tablet Take 4 tablets (4 g total) by mouth 2 (two) times daily. Patient not taking: Reported on 09/07/2016 08/25/15   Iva Boop, MD  cyclobenzaprine (FLEXERIL) 10 MG tablet Take 1 tablet (10 mg total) by mouth 3 (three) times daily as needed for muscle spasms. Patient not taking: Reported on 09/07/2016 07/15/16   Payton Mccallum, MD  levonorgestrel (MIRENA, 52 MG,) 20 MCG/24HR IUD 1 each by Intrauterine route once.    Historical Provider, MD  mesalamine (LIALDA) 1.2 G EC tablet Take 4 tablets (4.8 g total) by mouth daily with breakfast. Patient not taking: Reported on 09/07/2016 09/14/15   Iva Boop, MD  naproxen (NAPROSYN) 500 MG tablet Take 1 tablet (500 mg total) by mouth 2 (two) times daily. 09/07/16   Rolland Porter, MD  ondansetron (ZOFRAN ODT) 4 MG disintegrating tablet Take 1 tablet (4 mg total) by mouth every 8 (eight) hours as needed for  nausea. 09/07/16   Rolland PorterMark Maico Mulvehill, MD  oxyCODONE-acetaminophen (PERCOCET/ROXICET) 5-325 MG tablet 1-2 tabs po q 8 hours prn Patient not taking: Reported on 09/07/2016 09/06/16   Payton Mccallumrlando Conty, MD  predniSONE (DELTASONE) 10 MG tablet Take 20 mg by mouth for 4 days, 15 mg for 4 days, then 10 mg daily for 8 days then stop Patient not taking: Reported on 09/07/2016 08/25/15   Iva Booparl E Gessner, MD  sulfamethoxazole-trimethoprim (BACTRIM DS,SEPTRA DS) 800-160 MG tablet Take 1 tablet by mouth 2 (two) times daily. Patient not taking: Reported on 09/07/2016 09/06/16   Payton Mccallumrlando Conty, MD    Family History Family History  Problem Relation Age of Onset  . Hypertension Mother   . Hypertension Father   . Liver cancer Father   . Alcohol abuse Father   . Cirrhosis Father   . Breast cancer  Paternal Aunt   . Colon cancer Neg Hx   . Esophageal cancer Neg Hx   . Rectal cancer Neg Hx     Social History Social History  Substance Use Topics  . Smoking status: Never Smoker  . Smokeless tobacco: Never Used  . Alcohol use 0.0 oz/week     Comment: rare     Allergies   Morphine and related; Sulfur; and Vicodin [hydrocodone-acetaminophen]   Review of Systems Review of Systems  Constitutional: Negative for appetite change, chills, diaphoresis, fatigue and fever.  HENT: Negative for mouth sores, sore throat and trouble swallowing.   Eyes: Negative for visual disturbance.  Respiratory: Negative for cough, chest tightness, shortness of breath and wheezing.   Cardiovascular: Negative for chest pain.  Gastrointestinal: Positive for nausea. Negative for abdominal distention, abdominal pain, diarrhea and vomiting.  Endocrine: Negative for polydipsia, polyphagia and polyuria.  Genitourinary: Positive for dysuria and flank pain. Negative for frequency and hematuria.  Musculoskeletal: Positive for back pain. Negative for gait problem.  Skin: Negative for color change, pallor and rash.  Neurological: Negative for dizziness, syncope, light-headedness and headaches.  Hematological: Does not bruise/bleed easily.  Psychiatric/Behavioral: Negative for behavioral problems and confusion.     Physical Exam Updated Vital Signs BP 131/83   Pulse 68   Temp 98.2 F (36.8 C) (Oral)   Resp 16   Wt 151 lb (68.5 kg)   SpO2 100%   BMI 28.53 kg/m   Physical Exam  Constitutional: She is oriented to person, place, and time. She appears well-developed and well-nourished. No distress.  HENT:  Head: Normocephalic.  Eyes: Conjunctivae are normal. Pupils are equal, round, and reactive to light. No scleral icterus.  Neck: Normal range of motion. Neck supple. No thyromegaly present.  Cardiovascular: Normal rate and regular rhythm.  Exam reveals no gallop and no friction rub.   No murmur  heard. Pulmonary/Chest: Effort normal and breath sounds normal. No respiratory distress. She has no wheezes. She has no rales.  Abdominal: Soft. Bowel sounds are normal. She exhibits no distension. There is no tenderness. There is no rebound.  Musculoskeletal: Normal range of motion.  Tenderness in the right flank to palpate or even approach the skin with my hand.  Neurological: She is alert and oriented to person, place, and time.  Skin: Skin is warm and dry. No rash noted.  Psychiatric: She has a normal mood and affect. Her behavior is normal.     ED Treatments / Results  Labs (all labs ordered are listed, but only abnormal results are displayed) Labs Reviewed  URINALYSIS, ROUTINE W REFLEX MICROSCOPIC (NOT [facility]) - Abnormal; Notable  for the following:       Result Value   APPearance CLOUDY (*)    All other components within normal limits  COMPREHENSIVE METABOLIC PANEL - Abnormal; Notable for the following:    Calcium 8.6 (*)    All other components within normal limits  CBC - Abnormal; Notable for the following:    Hemoglobin 11.9 (*)    HCT 35.3 (*)    All other components within normal limits  LIPASE, BLOOD  TROPONIN I    EKG  EKG Interpretation None       Radiology No results found.  Procedures Procedures (including critical care time)  Medications Ordered in ED Medications  ondansetron (ZOFRAN) injection 4 mg (4 mg Intravenous Given 09/07/16 1843)  ketorolac (TORADOL) 30 MG/ML injection 30 mg (30 mg Intravenous Given 09/07/16 1843)     Initial Impression / Assessment and Plan / ED Course  I have reviewed the triage vital signs and the nursing notes.  Pertinent labs & imaging results that were available during my care of the patient were reviewed by me and considered in my medical decision making (see chart for details).  Clinical Course    Here does not suggest infection. CT scan not indicated at this time. He'll have blood or urine. No fever shakes  or chills. Reassuring studies. Given symptomatic treatment discharged home with probable viral syndrome and muscular skeletal pain.  Final Clinical Impressions(s) / ED Diagnoses   Final diagnoses:  Right-sided low back pain without sciatica, unspecified chronicity  Nausea    New Prescriptions Discharge Medication List as of 09/07/2016  8:00 PM    START taking these medications   Details  naproxen (NAPROSYN) 500 MG tablet Take 1 tablet (500 mg total) by mouth 2 (two) times daily., Starting Thu 09/07/2016, Print         Rolland Porter, MD 09/08/16 0010

## 2016-09-12 ENCOUNTER — Other Ambulatory Visit: Payer: Self-pay | Admitting: Nurse Practitioner

## 2016-09-12 ENCOUNTER — Ambulatory Visit
Admission: RE | Admit: 2016-09-12 | Discharge: 2016-09-12 | Disposition: A | Discharge status: Home / Self Care | Payer: PRIVATE HEALTH INSURANCE | Source: Ambulatory Visit | Attending: Nurse Practitioner | Admitting: Nurse Practitioner

## 2016-09-12 DIAGNOSIS — Z87442 Personal history of urinary calculi: Secondary | ICD-10-CM | POA: Diagnosis not present

## 2016-09-12 DIAGNOSIS — R319 Hematuria, unspecified: Secondary | ICD-10-CM | POA: Diagnosis present

## 2016-09-12 DIAGNOSIS — R310 Gross hematuria: Secondary | ICD-10-CM

## 2016-09-12 DIAGNOSIS — N200 Calculus of kidney: Secondary | ICD-10-CM | POA: Diagnosis not present

## 2017-03-13 ENCOUNTER — Ambulatory Visit: Payer: PRIVATE HEALTH INSURANCE | Admitting: Obstetrics & Gynecology

## 2017-03-28 ENCOUNTER — Ambulatory Visit: Payer: PRIVATE HEALTH INSURANCE | Admitting: Family Medicine

## 2017-03-28 DIAGNOSIS — Z01419 Encounter for gynecological examination (general) (routine) without abnormal findings: Secondary | ICD-10-CM

## 2017-03-28 NOTE — Progress Notes (Deleted)
Last pap 11/2014 - Normal and negative HPV

## 2017-03-30 ENCOUNTER — Other Ambulatory Visit: Payer: Self-pay | Admitting: Internal Medicine

## 2017-03-30 DIAGNOSIS — K52831 Collagenous colitis: Secondary | ICD-10-CM

## 2017-06-13 ENCOUNTER — Other Ambulatory Visit: Payer: Self-pay | Admitting: Internal Medicine

## 2017-06-13 DIAGNOSIS — K52831 Collagenous colitis: Secondary | ICD-10-CM

## 2017-07-25 ENCOUNTER — Other Ambulatory Visit: Payer: Self-pay | Admitting: Internal Medicine

## 2017-07-25 DIAGNOSIS — K52831 Collagenous colitis: Secondary | ICD-10-CM
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
## 2017-12-28 ENCOUNTER — Encounter: Payer: Self-pay | Admitting: Emergency Medicine

## 2017-12-28 ENCOUNTER — Other Ambulatory Visit: Payer: Self-pay

## 2017-12-28 ENCOUNTER — Emergency Department: Payer: PRIVATE HEALTH INSURANCE

## 2017-12-28 ENCOUNTER — Emergency Department
Admission: EM | Admit: 2017-12-28 | Discharge: 2017-12-28 | Disposition: A | Discharge status: Home / Self Care | Payer: PRIVATE HEALTH INSURANCE | Attending: Emergency Medicine | Admitting: Emergency Medicine

## 2017-12-28 DIAGNOSIS — Z79899 Other long term (current) drug therapy: Secondary | ICD-10-CM | POA: Diagnosis not present

## 2017-12-28 DIAGNOSIS — R197 Diarrhea, unspecified: Secondary | ICD-10-CM | POA: Diagnosis present

## 2017-12-28 DIAGNOSIS — N390 Urinary tract infection, site not specified: Secondary | ICD-10-CM | POA: Insufficient documentation

## 2017-12-28 DIAGNOSIS — E876 Hypokalemia: Secondary | ICD-10-CM | POA: Diagnosis not present

## 2017-12-28 DIAGNOSIS — E039 Hypothyroidism, unspecified: Secondary | ICD-10-CM | POA: Diagnosis not present

## 2017-12-28 DIAGNOSIS — E860 Dehydration: Secondary | ICD-10-CM | POA: Insufficient documentation

## 2017-12-28 DIAGNOSIS — J45909 Unspecified asthma, uncomplicated: Secondary | ICD-10-CM | POA: Diagnosis not present

## 2017-12-28 DIAGNOSIS — K529 Noninfective gastroenteritis and colitis, unspecified: Secondary | ICD-10-CM | POA: Insufficient documentation

## 2017-12-28 LAB — LIPASE, BLOOD: LIPASE: 30 U/L (ref 11–51)

## 2017-12-28 LAB — COMPREHENSIVE METABOLIC PANEL
ALT: 15 U/L (ref 14–54)
AST: 24 U/L (ref 15–41)
Albumin: 4.4 g/dL (ref 3.5–5.0)
Alkaline Phosphatase: 69 U/L (ref 38–126)
Anion gap: 7 (ref 5–15)
BILIRUBIN TOTAL: 0.6 mg/dL (ref 0.3–1.2)
BUN: 10 mg/dL (ref 6–20)
CHLORIDE: 102 mmol/L (ref 101–111)
CO2: 28 mmol/L (ref 22–32)
CREATININE: 0.65 mg/dL (ref 0.44–1.00)
Calcium: 9.1 mg/dL (ref 8.9–10.3)
GFR calc Af Amer: >60 mL/min (ref >60)
GFR calc non Af Amer: >60 mL/min (ref >60)
Glucose, Bld: 96 mg/dL (ref 65–99)
Potassium: 2.9 mmol/L — ABNORMAL LOW (ref 3.5–5.1)
Sodium: 137 mmol/L (ref 135–145)
TOTAL PROTEIN: 7.8 g/dL (ref 6.5–8.1)

## 2017-12-28 LAB — CBC
HCT: 41.8 % (ref 35.0–47.0)
Hemoglobin: 14 g/dL (ref 12.0–16.0)
MCH: 29.9 pg (ref 26.0–34.0)
MCHC: 33.5 g/dL (ref 32.0–36.0)
MCV: 89.4 fL (ref 80.0–100.0)
Platelets: 268 10*3/uL (ref 150–440)
RBC: 4.68 MIL/uL (ref 3.80–5.20)
RDW: 12.7 % (ref 11.5–14.5)
WBC: 6.6 10*3/uL (ref 3.6–11.0)

## 2017-12-28 LAB — URINALYSIS, COMPLETE (UACMP) WITH MICROSCOPIC
BACTERIA UA: NONE SEEN
Bilirubin Urine: NEGATIVE
Glucose, UA: NEGATIVE mg/dL
Hgb urine dipstick: NEGATIVE
Ketones, ur: NEGATIVE mg/dL
Nitrite: NEGATIVE
PROTEIN: 100 mg/dL — AB
Specific Gravity, Urine: 1.028 (ref 1.005–1.030)
pH: 5 (ref 5.0–8.0)

## 2017-12-28 LAB — GLUCOSE, CAPILLARY: GLUCOSE-CAPILLARY: 105 mg/dL — AB (ref 65–99)

## 2017-12-28 LAB — PREGNANCY, URINE: PREG TEST UR: NEGATIVE

## 2017-12-28 MED ORDER — ONDANSETRON HCL 4 MG/2ML IJ SOLN
4.0000 mg | Freq: Once | INTRAMUSCULAR | Status: AC
  Administered 2017-12-28: 4 mg via INTRAVENOUS
  Filled 2017-12-28: qty 2

## 2017-12-28 MED ORDER — OXYCODONE-ACETAMINOPHEN 5-325 MG PO TABS: 1.0000 | ORAL_TABLET | Freq: Four times a day (QID) | ORAL | 0 refills | Status: DC | PRN

## 2017-12-28 MED ORDER — MESALAMINE 1.2 G PO TBEC: 4.8000 g | DELAYED_RELEASE_TABLET | Freq: Every day | ORAL | 0 refills | Status: DC

## 2017-12-28 MED ORDER — CEPHALEXIN 500 MG PO CAPS: 500.0000 mg | ORAL_CAPSULE | Freq: Three times a day (TID) | ORAL | 0 refills | Status: AC

## 2017-12-28 MED ORDER — OXYCODONE-ACETAMINOPHEN 5-325 MG PO TABS
1.0000 | ORAL_TABLET | Freq: Once | ORAL | Status: AC
  Administered 2017-12-28: 1 via ORAL

## 2017-12-28 MED ORDER — POTASSIUM CHLORIDE ER 10 MEQ PO TBCR: 40.0000 meq | EXTENDED_RELEASE_TABLET | Freq: Every day | ORAL | 0 refills | Status: DC

## 2017-12-28 MED ORDER — OXYCODONE-ACETAMINOPHEN 5-325 MG PO TABS
ORAL_TABLET | ORAL | Status: AC
  Filled 2017-12-28: qty 1

## 2017-12-28 MED ORDER — ONDANSETRON HCL 4 MG PO TABS: 4.0000 mg | ORAL_TABLET | Freq: Every day | ORAL | 0 refills | Status: DC | PRN

## 2017-12-28 MED ORDER — SODIUM CHLORIDE 0.9 % IV BOLUS (SEPSIS)
1000.0000 mL | Freq: Once | INTRAVENOUS | Status: AC
  Administered 2017-12-28: 1000 mL via INTRAVENOUS

## 2017-12-28 MED ORDER — POTASSIUM CHLORIDE CRYS ER 20 MEQ PO TBCR
40.0000 meq | EXTENDED_RELEASE_TABLET | Freq: Once | ORAL | Status: AC
  Administered 2017-12-28: 40 meq via ORAL
  Filled 2017-12-28: qty 2

## 2017-12-28 NOTE — ED Provider Notes (Addendum)
[facility] Emergency Department Provider Note  ____________________________________________   First MD Initiated Contact with Patient 12/28/17 1222     (approximate)  I have reviewed the triage vital signs and the nursing notes.   HISTORY  Chief Complaint Dehydration and Nausea   HPI Alison Clarke is a 37 y.o. female street of colitis who is presenting with diarrhea over the past week.  She says that she has had diarrhea up to 15 times per day.  Denies any blood in her stool.  Says that whenever she eats anything she has diarrhea.  She says that she is supposed to be on the [place], 4 tabs, 1.2 mg, daily but has not been taking it for a month.  She says that this episode of diarrhea over the past week seems like a flare of her colitis.  She says that she is also had association symptoms of weakness.  She said that she became acutely short of breath and felt like her tongue was swelling today while she was having her blood drawn and also while she was being triaged at the emergency department.  She says that she has had these sensations when she has been dehydrated from a colitis in the past.  She is denying any pain at this time.  Denies sensation of tongue swelling.  Says that she was having intermittent left flank pain but that she has not any episodes today.  Past Medical History:  Diagnosis Date  . Allergic rhinitis   . Anxiety   . Asthma   . Colitis   . Collagenous colitis 02/25/2015   02/2015 colonoscopy  . Depression   . Heart murmur    past hx of but not now   . Hypothyroidism   . IBS (irritable bowel syndrome)   . Kidney stones   . Kidney stones   . Meningitis    age 21 -  . Shingles     Patient Active Problem List   Diagnosis Date Noted  . Collagenous colitis 02/25/2015    Past Surgical History:  Procedure Laterality Date  . COLONOSCOPY    . TONSILLECTOMY    . WISDOM TOOTH EXTRACTION      Prior to Admission medications   Medication  Sig Start Date End Date Taking? Authorizing Provider  cetirizine (ZYRTEC) 10 MG tablet Take 10 mg by mouth daily.   Yes [provider]  DULoxetine (CYMBALTA) 30 MG capsule Take 30 mg by mouth daily.   Yes [provider]  fluticasone (FLONASE) 50 MCG/ACT nasal spray Place 2 sprays into both nostrils daily.   Yes [provider]  lamoTRIgine (LAMICTAL) 25 MG tablet Take 25 mg by mouth every evening.  12/02/14  Yes [provider]  levonorgestrel (MIRENA, 52 MG,) 20 MCG/24HR IUD 1 each by Intrauterine route once.   Yes [provider]  levothyroxine (SYNTHROID, LEVOTHROID) 75 MCG tablet Take 75 mcg by mouth daily before breakfast.  04/04/15  Yes [provider]  ondansetron (ZOFRAN ODT) 4 MG disintegrating tablet Take 1 tablet (4 mg total) by mouth every 8 (eight) hours as needed for nausea. 09/07/16  Yes Rolland Porter, MD  colestipol (COLESTID) 1 G tablet Take 4 tablets (4 g total) by mouth 2 (two) times daily. Patient not taking: Reported on 09/07/2016 08/25/15   Iva Boop, MD  cyclobenzaprine (FLEXERIL) 10 MG tablet Take 1 tablet (10 mg total) by mouth 3 (three) times daily as needed for muscle spasms. Patient not taking:  Reported on 09/07/2016 07/15/16   Payton Mccallum, MD  mesalamine (LIALDA) 1.2 g EC tablet Take 4 tablets (4.8 g total) by mouth daily with breakfast. 12/28/17   Myrna Blazer, MD  naproxen (NAPROSYN) 500 MG tablet Take 1 tablet (500 mg total) by mouth 2 (two) times daily. Patient not taking: Reported on 12/28/2017 09/07/16   Rolland Porter, MD  oxyCODONE-acetaminophen (PERCOCET/ROXICET) 5-325 MG tablet 1-2 tabs po q 8 hours prn Patient not taking: Reported on 12/28/2017 09/06/16   Payton Mccallum, MD    Allergies Morphine and related; Sulfur; and Vicodin [hydrocodone-acetaminophen]  Family History  Problem Relation Age of Onset  . Hypertension Mother   . Hypertension Father   . Liver cancer Father   . Alcohol  abuse Father   . Cirrhosis Father   . Breast cancer Paternal Aunt   . Colon cancer Neg Hx   . Esophageal cancer Neg Hx   . Rectal cancer Neg Hx     Social History Social History   Tobacco Use  . Smoking status: Never Smoker  . Smokeless tobacco: Never Used  Substance Use Topics  . Alcohol use: Yes    Alcohol/week: 0.0 oz    Comment: rare  . Drug use: No    Review of Systems  Constitutional: No fever/chills Eyes: No visual changes. ENT: No sore throat. Cardiovascular: Denies chest pain. Respiratory: Denies shortness of breath. Gastrointestinal: No abdominal pain.  No nausea, no vomiting.  No diarrhea.  No constipation. Genitourinary: Negative for dysuria. Musculoskeletal: Negative for back pain. Skin: Negative for rash. Neurological: Negative for headaches, focal weakness or numbness.   ____________________________________________   PHYSICAL EXAM:  VITAL SIGNS: ED Triage Vitals [12/28/17 1153]  Enc Vitals Group     BP 117/77     Pulse Rate 75     Resp 17     Temp 98.2 F (36.8 C)     Temp Source Oral     SpO2 100 %     Weight 165 lb (74.8 kg)     Height 5\' 1"  (1.549 m)     Head Circumference      Peak Flow      Pain Score      Pain Loc      Pain Edu?      Excl. in GC?     Constitutional: Alert and oriented. Well appearing and in no acute distress. Eyes: Conjunctivae are normal.  Head: Atraumatic. Nose: No congestion/rhinnorhea. Mouth/Throat: Mucous membranes are moist.  Neck: No stridor.   Cardiovascular: Normal rate, regular rhythm. Grossly normal heart sounds.   Respiratory: Normal respiratory effort.  No retractions. Lungs CTAB. Gastrointestinal: Soft and nontender. No distention. No CVA tenderness. Musculoskeletal: No lower extremity tenderness nor edema.  No joint effusions. Neurologic:  Normal speech and language. No gross focal neurologic deficits are appreciated. Skin:  Skin is warm, dry and intact. No rash noted. Psychiatric: Mood and  affect are normal. Speech and behavior are normal.  ____________________________________________   LABS (all labs ordered are listed, but only abnormal results are displayed)  Labs Reviewed  COMPREHENSIVE METABOLIC PANEL - Abnormal; Notable for the following components:      Result Value   Potassium 2.9 (*)    All other components within normal limits  URINALYSIS, COMPLETE (UACMP) WITH MICROSCOPIC - Abnormal; Notable for the following components:   Color, Urine AMBER (*)    APPearance CLOUDY (*)    Protein, ur 100 (*)    Leukocytes, UA TRACE (*)  Squamous Epithelial / LPF 6-30 (*)    All other components within normal limits  GLUCOSE, CAPILLARY - Abnormal; Notable for the following components:   Glucose-Capillary 105 (*)    All other components within normal limits  LIPASE, BLOOD  CBC  PREGNANCY, URINE   ____________________________________________  EKG  ED ECG REPORT I, Arelia Longestavid M Schaevitz, the attending physician, personally viewed and interpreted this ECG.   Date: 12/28/2017  EKG Time: 1226  Rate: 72  Rhythm: normal sinus rhythm  Axis: Normal  Intervals:none  ST&T Change: No ST segment elevation or depression.  No abnormal T wave inversion.  ____________________________________________  RADIOLOGY  Renal ultrasound without any acute findings. ____________________________________________   PROCEDURES  Procedure(s) performed:   Procedures  Critical Care performed:   ____________________________________________   INITIAL IMPRESSION / ASSESSMENT AND PLAN / ED COURSE  Pertinent labs & imaging results that were available during my care of the patient were reviewed by me and considered in my medical decision making (see chart for details).  Differential diagnosis includes, but is not limited to, ovarian cyst, ovarian torsion, acute appendicitis, diverticulitis, urinary tract infection/pyelonephritis, endometriosis, bowel obstruction, colitis, renal colic,  gastroenteritis, hernia, fibroids, endometriosis, pregnancy related pain including ectopic pregnancy, etc.  As part of my medical decision making, I reviewed the following data within the electronic MEDICAL RECORD NUMBER Old chart reviewed  Patient will be given IV normal saline as well as Zofran.  Will likely require follow-up as an outpatient.  Will likely also need restarting of her mesalamine.    ----------------------------------------- 2:57 PM on 12/28/2017 -----------------------------------------  Patient pending results of her ultrasound at this time.  Dr. Roxan Hockeyobinson to follow.  Signed out to Dr. Roxan Hockeyobinson.  As long as the patient feels improved with fluids and Zofran and her ultrasound is reassuring I feel that she can be discharged to home for follow-up as an outpatient.  She is understanding of this plan willing to comply.  We will also refill her mesalamine.  ----------------------------------------- 3:17 PM on 12/28/2017 -----------------------------------------  Patient is now returned from ultrasound.  Says that she is having left lower back pain where the ultrasound was performed.  However, she says the pain is decreasing and she does not want any pain medications at this time.  She is nontender to the bilateral flanks at this time without any CVA tenderness.  She is also calm and without any distress.  Patient possibly with pyelonephritis vs kidney stone.  However, less likely to be kidney stone no blood in the urine.  Will treat with antibiotics.  We will also discharged with Percocet and Flomax.  I do not believe the patient needs to be admitted at this time.  She is nontoxic in appearance.  No fever.  We discussed return precautions such as fever, weakness or any worsening or concerning symptoms.  She is understanding of this plan willing to comply. ____________________________________________   FINAL CLINICAL IMPRESSION(S) / ED DIAGNOSES  Colitis.  UTI.  Hypokalemia.    NEW  MEDICATIONS STARTED DURING THIS VISIT:  New Prescriptions   MESALAMINE (LIALDA) 1.2 G EC TABLET    Take 4 tablets (4.8 g total) by mouth daily with breakfast.     Note:  This document was prepared using Dragon voice recognition software and may include unintentional dictation errors.     Myrna BlazerSchaevitz, David Matthew, MD 12/28/17 1457    Schaevitz, Myra Rudeavid Matthew, MD 12/28/17 1518    Pershing ProudSchaevitz, Myra Rudeavid Matthew, MD 12/28/17 1524

## 2017-12-28 NOTE — ED Notes (Signed)
Pt color returning back to normal. RR unlabored, lungs clear.

## 2017-12-28 NOTE — ED Notes (Signed)
Pt reports that she had bloodwork at doctors office. Stuck X 2 for phlebotomy collect in triage. Stuck X 1 for PIV insertion by nursing student, unsuccessful and by this RN for PIV placement to left hand.

## 2017-12-28 NOTE — ED Notes (Addendum)
Pt states that she went to PCP today due to diarrhea X 1 week, generalized weakness. Pt diaphoretic, pale upon arrival to room. Pt states that she was feeling faint in triage. PT alert and oriented X 4. ekg performed.  pt states that she took her own 4mg  ODT Zofran at time of triage, EDP aware

## 2017-12-28 NOTE — ED Notes (Signed)
CBG 105 

## 2017-12-28 NOTE — ED Triage Notes (Signed)
Pt to ED c/o not feeling well for past couple of weeks, states nausea, unable to keep food or drink down for a couple days, diarrhea.  Denies urinary symptoms.

## 2017-12-28 NOTE — ED Notes (Signed)
Pt also reporting right sided flank pain.

## 2017-12-28 NOTE — ED Notes (Signed)
ED Provider at bedside. 

## 2018-01-01 ENCOUNTER — Encounter: Payer: Self-pay | Admitting: Obstetrics and Gynecology

## 2018-01-01 ENCOUNTER — Ambulatory Visit: Payer: PRIVATE HEALTH INSURANCE | Admitting: Obstetrics and Gynecology

## 2018-01-01 DIAGNOSIS — Z01419 Encounter for gynecological examination (general) (routine) without abnormal findings: Secondary | ICD-10-CM

## 2018-01-01 NOTE — Progress Notes (Signed)
Patient did not keep GYN appointment for 01/01/2018.  Alison Clarke, Jr MD Attending [facility](Faculty Practice)

## 2018-02-06 ENCOUNTER — Other Ambulatory Visit: Payer: Self-pay | Admitting: Internal Medicine

## 2018-05-14 ENCOUNTER — Encounter: Payer: Self-pay | Admitting: Emergency Medicine

## 2018-05-14 ENCOUNTER — Other Ambulatory Visit: Payer: Self-pay

## 2018-05-14 ENCOUNTER — Ambulatory Visit (INDEPENDENT_AMBULATORY_CARE_PROVIDER_SITE_OTHER): Payer: PRIVATE HEALTH INSURANCE

## 2018-05-14 ENCOUNTER — Ambulatory Visit
Admission: EM | Admit: 2018-05-14 | Discharge: 2018-05-14 | Disposition: A | Discharge status: Home / Self Care | Payer: PRIVATE HEALTH INSURANCE | Attending: Family Medicine | Admitting: Family Medicine

## 2018-05-14 DIAGNOSIS — W19XXXA Unspecified fall, initial encounter: Secondary | ICD-10-CM | POA: Diagnosis not present

## 2018-05-14 DIAGNOSIS — M25561 Pain in right knee: Secondary | ICD-10-CM | POA: Diagnosis not present

## 2018-05-14 MED ORDER — MELOXICAM 15 MG PO TABS: 15.0000 mg | ORAL_TABLET | Freq: Every day | ORAL | 0 refills | Status: DC | PRN

## 2018-05-14 NOTE — ED Provider Notes (Signed)
[facility] ____________________________________________  Time seen: Approximately 11:39 AM  I have reviewed the triage vital signs and the nursing notes.   HISTORY  Chief Complaint Knee Pain (right)  HPI Alison Clarke is a 37 y.o. female present for evaluation of right knee pain.  Patient states that she has been having knee pain since December of this past year, but reports that is only been more persistent as she reports she's always had some issues with her right knee.  Patient states since December the pain comes and goes, usually resolves after a day or 2, but has not been previously evaluated for the same.  Denies any fall or trauma that initiated pain in December.  However reports yesterday while working outside and watering plants the grass was wet, and when she turned quickly she  slipped and fell directly on the right knee.  States since then the pain has increased.  Denies any other pain or injury.  States she does have a popping sensation to the right knee with movement, but states that that has been intermittently present for months.  Denies decreased range of motion, paresthesias or break in skin.  States pain does occasionally radiate down to her shin.  Reports otherwise feels well.  States that she took 1 of her son's headache pills yesterday but unsure the name of the medicine with slight improvement.  No other over-the-counter medications taken for the same complaints.  Continues remain ambulatory.  Denies other aggravating or alleviating factors. Denies recent sickness. Denies recent antibiotic use.   No LMP recorded. (Menstrual status: IUD).  Armando Gang, FNP: PCP   Past Medical History:  Diagnosis Date  . Allergic rhinitis   . Anxiety   . Asthma   . Colitis   . Collagenous colitis 02/25/2015   02/2015 colonoscopy  . Depression   . Heart murmur    past hx of but not now   . Hypothyroidism   . IBS (irritable bowel syndrome)   . Kidney stones     . Kidney stones   . Meningitis    age 5 -  . Shingles     Patient Active Problem List   Diagnosis Date Noted  . Collagenous colitis 02/25/2015    Past Surgical History:  Procedure Laterality Date  . COLONOSCOPY    . TONSILLECTOMY    . WISDOM TOOTH EXTRACTION       No current facility-administered medications for this encounter.   Current Outpatient Medications:  .  cetirizine (ZYRTEC) 10 MG tablet, Take 10 mg by mouth daily., Disp: , Rfl:  .  DULoxetine (CYMBALTA) 30 MG capsule, Take 30 mg by mouth daily., Disp: , Rfl:  .  fluticasone (FLONASE) 50 MCG/ACT nasal spray, Place 2 sprays into both nostrils daily., Disp: , Rfl:  .  lamoTRIgine (LAMICTAL) 25 MG tablet, Take 25 mg by mouth every evening. , Disp: , Rfl: 1 .  levonorgestrel (MIRENA, 52 MG,) 20 MCG/24HR IUD, 1 each by Intrauterine route once., Disp: , Rfl:  .  levothyroxine (SYNTHROID, LEVOTHROID) 75 MCG tablet, Take 75 mcg by mouth daily before breakfast. , Disp: , Rfl: 0 .  meloxicam (MOBIC) 15 MG tablet, Take 1 tablet (15 mg total) by mouth daily as needed., Disp: 10 tablet, Rfl: 0  Allergies Morphine and related; Sulfur; and Vicodin [hydrocodone-acetaminophen]  Family History  Problem Relation Age of Onset  . Hypertension Mother   . Hypertension Father   . Liver cancer Father   . Alcohol  abuse Father   . Cirrhosis Father   . Breast cancer Paternal Aunt   . Colon cancer Neg Hx   . Esophageal cancer Neg Hx   . Rectal cancer Neg Hx     Social History Social History   Tobacco Use  . Smoking status: Never Smoker  . Smokeless tobacco: Never Used  Substance Use Topics  . Alcohol use: Yes    Alcohol/week: 0.0 oz    Comment: rare  . Drug use: No    Review of Systems Constitutional: No fever Cardiovascular: Denies chest pain. Respiratory: Denies shortness of breath. Musculoskeletal:  As above.  Skin: Negative for rash.  ____________________________________________   PHYSICAL EXAM:  VITAL  SIGNS: ED Triage Vitals  Enc Vitals Group     BP 05/14/18 1044 118/75     Pulse Rate 05/14/18 1044 73     Resp 05/14/18 1044 16     Temp 05/14/18 1044 98 F (36.7 C)     Temp Source 05/14/18 1044 Oral     SpO2 05/14/18 1044 100 %     Weight 05/14/18 1041 175 lb (79.4 kg)     Height 05/14/18 1041 5\' 1"  (1.549 m)     Head Circumference --      Peak Flow --      Pain Score 05/14/18 1041 5     Pain Loc --      Pain Edu? --      Excl. in GC? --     Constitutional: Alert and oriented. Well appearing and in no acute distress. ENT      Head: Normocephalic and atraumatic. Cardiovascular: Normal rate, regular rhythm. Grossly normal heart sounds.  Good peripheral circulation. Respiratory: Normal respiratory effort without tachypnea nor retractions. Breath sounds are clear and equal bilaterally. No wheezes, rales, rhonchi. Musculoskeletal: Bilateral pedal pulses equal and easily palpated. Except: right anterior mid to medial knee mild tenderness to direct palpation, minimal medial effusion palpation, full range of motion, mild pain with anterior and posterior drawer test, no medial or lateral stress pain, no pain with resisted flexion or extension, right lower extremity otherwise nontender.  Neurologic:  Normal speech and language.  Speech is normal.  Skin:  Skin is warm, dry and intact. No rash noted. Psychiatric: Mood and affect are normal. Speech and behavior are normal. Patient exhibits appropriate insight and judgment   ___________________________________________   LABS (all labs ordered are listed, but only abnormal results are displayed)  Labs Reviewed - No data to display ____________________________________________  RADIOLOGY  Dg Knee Complete 4 Views Right  Result Date: 05/14/2018 CLINICAL DATA:  Chronic right knee pain since Christmas 2018, aggravated by a fall last night. EXAM: RIGHT KNEE - COMPLETE 4+ VIEW COMPARISON:  None. FINDINGS: No evidence of fracture, dislocation,  or joint effusion. No evidence of arthropathy or other focal bone abnormality except for a tiny marginal osteophyte on the lateral aspect of the patella. Soft tissues are unremarkable. IMPRESSION: No significant abnormality the right knee. Electronically Signed   By: Francene Boyers M.D.   On: 05/14/2018 11:21   ____________________________________________   PROCEDURES Procedures   INITIAL IMPRESSION / ASSESSMENT AND PLAN / ED COURSE  Pertinent labs & imaging results that were available during my care of the patient were reviewed by me and considered in my medical decision making (see chart for details).  Well appearing patient. Right knee pain x 6-7 months intermittently, increased post mechanical fall yesterday. Right knee xray results as above per radiology, no significant  abnormality. Recommend Orthopedic follow up for further evaluation as concern for possible meniscal or ligamentous injury. Will treat with oral daily mobic, rest, ice, and recommend over-the-counter supportive sleeve splint.  Information for local orthopedic given.Discussed indication, risks and benefits of medications with patient.  Discussed follow up with Primary care physician this week. Discussed follow up and return parameters including no resolution or any worsening concerns. Patient verbalized understanding and agreed to plan.   ____________________________________________   FINAL CLINICAL IMPRESSION(S) / ED DIAGNOSES  Final diagnoses:  Acute pain of right knee     ED Discharge Orders        Ordered    meloxicam (MOBIC) 15 MG tablet  Daily PRN     05/14/18 1124       Note: This dictation was prepared with Dragon dictation along with smaller phrase technology. Any transcriptional errors that result from this process are unintentional.         Renford DillsMiller, Monquie Fulgham, NP 05/14/18 1158

## 2018-05-14 NOTE — Discharge Instructions (Addendum)
Take medication as prescribed. Rest. Ice. Stretch.   Follow-up with orthopedic in 1 week.  Follow up with your primary care physician this week as needed. Return to Urgent care for new or worsening concerns.

## 2018-05-14 NOTE — ED Triage Notes (Signed)
Patient c/o right knee pain that started months ago.  Patient states that she fell on her right knee yesterday and has made her pain worse.

## 2018-07-18 ENCOUNTER — Encounter: Payer: Self-pay | Admitting: Obstetrics and Gynecology

## 2018-07-19 ENCOUNTER — Ambulatory Visit (INDEPENDENT_AMBULATORY_CARE_PROVIDER_SITE_OTHER): Payer: PRIVATE HEALTH INSURANCE

## 2018-07-19 ENCOUNTER — Telehealth: Payer: Self-pay | Admitting: Emergency Medicine

## 2018-07-19 ENCOUNTER — Ambulatory Visit
Admission: EM | Admit: 2018-07-19 | Discharge: 2018-07-19 | Disposition: A | Discharge status: Home / Self Care | Payer: PRIVATE HEALTH INSURANCE | Attending: Emergency Medicine | Admitting: Emergency Medicine

## 2018-07-19 ENCOUNTER — Other Ambulatory Visit: Payer: Self-pay

## 2018-07-19 ENCOUNTER — Ambulatory Visit
Admission: RE | Admit: 2018-07-19 | Discharge: 2018-07-19 | Disposition: A | Discharge status: Home / Self Care | Payer: PRIVATE HEALTH INSURANCE | Source: Ambulatory Visit | Attending: Emergency Medicine | Admitting: Emergency Medicine

## 2018-07-19 DIAGNOSIS — N898 Other specified noninflammatory disorders of vagina: Secondary | ICD-10-CM

## 2018-07-19 DIAGNOSIS — R1032 Left lower quadrant pain: Secondary | ICD-10-CM | POA: Diagnosis not present

## 2018-07-19 DIAGNOSIS — M545 Low back pain, unspecified: Secondary | ICD-10-CM

## 2018-07-19 DIAGNOSIS — R102 Pelvic and perineal pain: Secondary | ICD-10-CM

## 2018-07-19 DIAGNOSIS — M549 Dorsalgia, unspecified: Secondary | ICD-10-CM | POA: Diagnosis not present

## 2018-07-19 DIAGNOSIS — R350 Frequency of micturition: Secondary | ICD-10-CM

## 2018-07-19 DIAGNOSIS — B9689 Other specified bacterial agents as the cause of diseases classified elsewhere: Secondary | ICD-10-CM

## 2018-07-19 DIAGNOSIS — N200 Calculus of kidney: Secondary | ICD-10-CM

## 2018-07-19 DIAGNOSIS — Z113 Encounter for screening for infections with a predominantly sexual mode of transmission: Secondary | ICD-10-CM | POA: Diagnosis not present

## 2018-07-19 DIAGNOSIS — N760 Acute vaginitis: Secondary | ICD-10-CM

## 2018-07-19 LAB — URINALYSIS, COMPLETE (UACMP) WITH MICROSCOPIC
BILIRUBIN URINE: NEGATIVE
Glucose, UA: NEGATIVE mg/dL
Hgb urine dipstick: NEGATIVE
KETONES UR: NEGATIVE mg/dL
LEUKOCYTES UA: NEGATIVE
NITRITE: NEGATIVE
Protein, ur: NEGATIVE mg/dL
Specific Gravity, Urine: 1.015 (ref 1.005–1.030)
pH: 5.5 (ref 5.0–8.0)

## 2018-07-19 LAB — CBC WITH DIFFERENTIAL/PLATELET
Basophils Absolute: 0.1 10*3/uL (ref 0–0.1)
Basophils Relative: 1 %
EOS PCT: 5 %
Eosinophils Absolute: 0.3 10*3/uL (ref 0–0.7)
HCT: 38.8 % (ref 35.0–47.0)
Hemoglobin: 13.1 g/dL (ref 12.0–16.0)
LYMPHS ABS: 1.4 10*3/uL (ref 1.0–3.6)
LYMPHS PCT: 21 %
MCH: 30.2 pg (ref 26.0–34.0)
MCHC: 33.7 g/dL (ref 32.0–36.0)
MCV: 89.6 fL (ref 80.0–100.0)
Monocytes Absolute: 0.5 10*3/uL (ref 0.2–0.9)
Monocytes Relative: 8 %
Neutro Abs: 4.3 10*3/uL (ref 1.4–6.5)
Neutrophils Relative %: 65 %
PLATELETS: 255 10*3/uL (ref 150–440)
RBC: 4.33 MIL/uL (ref 3.80–5.20)
RDW: 12.8 % (ref 11.5–14.5)
WBC: 6.6 10*3/uL (ref 3.6–11.0)

## 2018-07-19 LAB — BASIC METABOLIC PANEL
Anion gap: 7 (ref 5–15)
BUN: 8 mg/dL (ref 6–20)
CHLORIDE: 105 mmol/L (ref 98–111)
CO2: 25 mmol/L (ref 22–32)
Calcium: 8.5 mg/dL — ABNORMAL LOW (ref 8.9–10.3)
Creatinine, Ser: 0.74 mg/dL (ref 0.44–1.00)
GFR calc Af Amer: >60 mL/min (ref >60)
GFR calc non Af Amer: >60 mL/min (ref >60)
GLUCOSE: 113 mg/dL — AB (ref 70–99)
POTASSIUM: 3.4 mmol/L — AB (ref 3.5–5.1)
Sodium: 137 mmol/L (ref 135–145)

## 2018-07-19 LAB — WET PREP, GENITAL
Sperm: NONE SEEN
TRICH WET PREP: NONE SEEN
Yeast Wet Prep HPF POC: NONE SEEN

## 2018-07-19 MED ORDER — KETOROLAC TROMETHAMINE 60 MG/2ML IM SOLN
30.0000 mg | Freq: Once | INTRAMUSCULAR | Status: AC
Start: 2018-07-19 — End: 2018-07-19
  Administered 2018-07-19: 30 mg via INTRAMUSCULAR

## 2018-07-19 MED ORDER — AZITHROMYCIN 500 MG PO TABS: 1000.0000 mg | ORAL_TABLET | Freq: Once | ORAL | 0 refills | Status: AC

## 2018-07-19 MED ORDER — CEFTRIAXONE SODIUM 250 MG IJ SOLR
250.0000 mg | Freq: Once | INTRAMUSCULAR | Status: AC
  Administered 2018-07-19: 250 mg via INTRAMUSCULAR

## 2018-07-19 MED ORDER — IBUPROFEN 600 MG PO TABS: 600.0000 mg | ORAL_TABLET | Freq: Four times a day (QID) | ORAL | 0 refills | Status: DC | PRN

## 2018-07-19 MED ORDER — ONDANSETRON 8 MG PO TBDP: 8.0000 mg | ORAL_TABLET | Freq: Two times a day (BID) | ORAL | 0 refills | Status: DC

## 2018-07-19 MED ORDER — METRONIDAZOLE 500 MG PO TABS: 500.0000 mg | ORAL_TABLET | Freq: Two times a day (BID) | ORAL | 0 refills | Status: AC

## 2018-07-19 MED ORDER — AZITHROMYCIN 500 MG PO TABS
1000.0000 mg | ORAL_TABLET | Freq: Once | ORAL | Status: AC
  Administered 2018-07-19: 1000 mg via ORAL

## 2018-07-19 NOTE — ED Provider Notes (Addendum)
HPI  SUBJECTIVE:  Alison Clarke is a 37 y.o. female who presents with intermittent left low back and midline pelvic pain for the past 2 weeks.  Patient states that it has become constant and sharp for the past 2 days.  She reports significant fatigue, malaise.  She denies radiation or migration of the back or pelvic pain.  She reports fevers to 100 last night.  No vomiting.  No dysuria, urgency, frequency, cloudy or odorous urine, hematuria.  No abnormal vaginal bleeding although she reports brownish spotting last week.  She is amenorrheic due to her Mirena IUD.  No vaginal odor, discharge.  She is in a long-term monogamous relationship with a female who is asymptomatic, STDs are not a concern today.  She has tried ibuprofen without improvement of symptoms.  No alleviating factors.  Symptoms are worse with drinking fluids.  It is not associated with movement, eating, bowel movement, urination.  She denies epistaxis, melena, hematochezia.  States that she has had similar back and pelvic pain in the past, but states that she is unable to differentiate between nephrolithiasis versus ovarian cyst pain.  She has a past medical history of UTI, pyelonephritis, obstructing nephrolithiasis requiring lithotripsy x2, colitis, ovarian cyst, BV, hypothyroidism.  No history of gonorrhea, chlamydia, HIV, HSV, syphilis, Trichomonas, yeast infections.  Patient has been seen by OB/GYN twice 2016 for pelvic pain, and was to get pelvic ultrasound, but this was never done per chart review.  Past Medical History:  Diagnosis Date  . Allergic rhinitis   . Anxiety   . Asthma   . Colitis   . Collagenous colitis 02/25/2015   02/2015 colonoscopy  . Depression   . Heart murmur    past hx of but not now   . Hypothyroidism   . IBS (irritable bowel syndrome)   . Kidney stones   . Kidney stones   . Meningitis    age 10 -  . Shingles     Past Surgical History:  Procedure Laterality Date  . COLONOSCOPY    . TONSILLECTOMY     . WISDOM TOOTH EXTRACTION      Family History  Problem Relation Age of Onset  . Hypertension Mother   . Hypertension Father   . Liver cancer Father   . Alcohol abuse Father   . Cirrhosis Father   . Breast cancer Paternal Aunt   . Colon cancer Neg Hx   . Esophageal cancer Neg Hx   . Rectal cancer Neg Hx     Social History   Tobacco Use  . Smoking status: Never Smoker  . Smokeless tobacco: Never Used  Substance Use Topics  . Alcohol use: Yes    Alcohol/week: 0.0 standard drinks    Comment: rare  . Drug use: No    No current facility-administered medications for this encounter.   Current Outpatient Medications:  .  cetirizine (ZYRTEC) 10 MG tablet, Take 10 mg by mouth daily., Disp: , Rfl:  .  DULoxetine (CYMBALTA) 30 MG capsule, Take 30 mg by mouth daily., Disp: , Rfl:  .  fluticasone (FLONASE) 50 MCG/ACT nasal spray, Place 2 sprays into both nostrils daily., Disp: , Rfl:  .  lamoTRIgine (LAMICTAL) 25 MG tablet, Take 25 mg by mouth every evening. , Disp: , Rfl: 1 .  levonorgestrel (MIRENA, 52 MG,) 20 MCG/24HR IUD, 1 each by Intrauterine route once., Disp: , Rfl:  .  levothyroxine (SYNTHROID, LEVOTHROID) 75 MCG tablet, Take 75 mcg by mouth daily before breakfast. ,  Disp: , Rfl: 0 .  ibuprofen (ADVIL,MOTRIN) 600 MG tablet, Take 1 tablet (600 mg total) by mouth every 6 (six) hours as needed., Disp: 30 tablet, Rfl: 0 .  metroNIDAZOLE (FLAGYL) 500 MG tablet, Take 1 tablet (500 mg total) by mouth 2 (two) times daily for 14 days., Disp: 28 tablet, Rfl: 0 .  ondansetron (ZOFRAN ODT) 8 MG disintegrating tablet, Take 1 tablet (8 mg total) by mouth 2 (two) times daily. as needed for nausea, Disp: 12 tablet, Rfl: 0  Allergies  Allergen Reactions  . Doxycycline Nausea And Vomiting  . Morphine And Related Nausea And Vomiting  . Sulfur Hives    Swell, rash, burns skin  . Vicodin [Hydrocodone-Acetaminophen] Nausea And Vomiting     ROS  As noted in HPI.   Physical Exam  BP  122/68 (BP Location: Left Arm)   Pulse 74   Temp 98.3 F (36.8 C) (Oral)   Resp 18   Ht 5\' 1"  (1.549 m)   Wt 83.5 kg   SpO2 100%   BMI 34.77 kg/m   Constitutional: Well developed, well nourished, no acute distress Eyes:  EOMI, conjunctiva normal bilaterally HENT: Normocephalic, atraumatic,mucus membranes moist Respiratory: Normal inspiratory effort Cardiovascular: Normal rate GI: nondistended,  positive suprapubic tenderness.  Positive left flank tenderness.  Negative Murphy, negative McBurney.  Nondistended, active bowel sounds. Back: Left CVAT GU: Normal external genitalia.  Yellowish discharge coming from os.  IUD strings present.  Positive CMT.  Positive adnexal tenderness bilaterally.  No adnexal masses.  Chaperone present during exam. skin: No rash, skin intact Musculoskeletal: no deformities Neurologic: Alert & oriented x 3, no focal neuro deficits Psychiatric: Speech and behavior appropriate   ED Course   Medications  ketorolac (TORADOL) injection 30 mg (30 mg Intramuscular Given 07/19/18 1452)  cefTRIAXone (ROCEPHIN) injection 250 mg (250 mg Intramuscular Given 07/19/18 1545)  azithromycin (ZITHROMAX) tablet 1,000 mg (1,000 mg Oral Given 07/19/18 1609)    Orders Placed This Encounter  Procedures  . Chlamydia/NGC rt PCR    Standing Status:   Standing    Number of Occurrences:   1    Order Specific Question:   Patient immune status    Answer:   Normal  . Wet prep, genital    Standing Status:   Standing    Number of Occurrences:   1    Order Specific Question:   Patient immune status    Answer:   Normal  . Urine culture    Standing Status:   Standing    Number of Occurrences:   1    Order Specific Question:   Patient immune status    Answer:   Normal  . CT ABDOMEN PELVIS WO CONTRAST    Standing Status:   Standing    Number of Occurrences:   1    Order Specific Question:   Is Oral Contrast requested for this exam?    Answer:   No oral contrast    Order Specific  Question:   Reason for No Oral Contrast    Answer:   Other    Order Specific Question:   Please answer why no oral contrast is requested    Answer:   Oral contrast not necessary    Order Specific Question:   Call Results- Best Contact Number?    Answer:   [phone]    Order Specific Question:   Radiology Contrast Protocol - do NOT remove file path    Answer:   [email or web address]  .  US PELVIC COMPLETE WITH TRANSVAGINAL    Standing Status:   Future    Number of Occurrences:   1    Standing Expiration Date:   09/19/2019    Order Specific Question:   Reason for Exam (SYMPTOM  OR DIAGNOSIS REQUIRED)    Answer:   evaluate for ovarian cyst, ruptured cyst, TOA    Order Specific Question:   Preferred imaging location?    Answer:   [facility]    Order Specific Question:   Call Results- Best Contact Number?    Answer:   [phone]  . Urinalysis, Complete w Microscopic    Standing Status:   Standing    Number of Occurrences:   1  . Basic metabolic panel    Standing Status:   Standing    Number of Occurrences:   1  . CBC with Differential    Standing Status:   Standing    Number of Occurrences:   1    Results for orders placed or performed during the hospital encounter of 07/19/18 (from the past 24 hour(s))  Chlamydia/NGC rt PCR     Status: None   Collection Time: 07/19/18  2:38 PM  Result Value Ref Range   Specimen source GC/Chlam VAGINA    Chlamydia Tr NOT DETECTED NOT DETECTED   N gonorrhoeae NOT DETECTED NOT DETECTED  Wet prep, genital     Status: Abnormal   Collection Time: 07/19/18  2:38 PM  Result Value Ref Range   Yeast Wet Prep HPF POC NONE SEEN NONE SEEN   Trich, Wet Prep NONE SEEN NONE SEEN   Clue Cells Wet Prep HPF POC PRESENT (A) NONE SEEN   WBC, Wet Prep HPF POC MODERATE (A) NONE SEEN   Sperm NONE SEEN   Basic metabolic panel     Status: Abnormal   Collection Time: 07/19/18  3:25 PM  Result Value Ref Range   Sodium 137 135 - 145  mmol/L   Potassium 3.4 (L) 3.5 - 5.1 mmol/L   Chloride 105 98 - 111 mmol/L   CO2 25 22 - 32 mmol/L   Glucose, Bld 113 (H) 70 - 99 mg/dL   BUN 8 6 - 20 mg/dL   Creatinine, Ser 0.98 0.44 - 1.00 mg/dL   Calcium 8.5 (L) 8.9 - 10.3 mg/dL   GFR calc non Af Amer >60 >60 mL/min   GFR calc Af Amer >60 >60 mL/min   Anion gap 7 5 - 15  CBC with Differential     Status: None   Collection Time: 07/19/18  3:25 PM  Result Value Ref Range   WBC 6.6 3.6 - 11.0 K/uL   RBC 4.33 3.80 - 5.20 MIL/uL   Hemoglobin 13.1 12.0 - 16.0 g/dL   HCT 11.9 14.7 - 82.9 %   MCV 89.6 80.0 - 100.0 fL   MCH 30.2 26.0 - 34.0 pg   MCHC 33.7 32.0 - 36.0 g/dL   RDW 56.2 13.0 - 86.5 %   Platelets 255 150 - 440 K/uL   Neutrophils Relative % 65 %   Neutro Abs 4.3 1.4 - 6.5 K/uL   Lymphocytes Relative 21 %   Lymphs Abs 1.4 1.0 - 3.6 K/uL   Monocytes Relative 8 %   Monocytes Absolute 0.5 0.2 - 0.9 K/uL   Eosinophils Relative 5 %   Eosinophils Absolute 0.3 0 - 0.7 K/uL   Basophils Relative 1 %   Basophils Absolute 0.1 0 - 0.1 K/uL   Ct Abdomen Pelvis  Wo Contrast  Result Date: 07/19/2018 CLINICAL DATA:  Left lower quadrant abdominal pain. EXAM: CT ABDOMEN AND PELVIS WITHOUT CONTRAST TECHNIQUE: Multidetector CT imaging of the abdomen and pelvis was performed following the standard protocol without IV contrast. COMPARISON:  Abdominal ultrasound 12/28/2017 FINDINGS: Lower chest: No acute abnormality. Hepatobiliary: No focal liver abnormality is seen. No gallstones, gallbladder wall thickening, or biliary dilatation. Pancreas: Unremarkable. No pancreatic ductal dilatation or surrounding inflammatory changes. Spleen: Normal in size without focal abnormality. Adrenals/Urinary Tract: Normal adrenal glands. No evidence of hydronephrosis. Bilateral nephrolithiasis. Nonobstructive 6 mm calculus in the lower pole of the right kidney. Several nonobstructive calculi in the mid and lower polar region of the left kidney, the largest measuring 3  mm. Normal ureters and urinary bladder. Stomach/Bowel: Stomach is within normal limits. Appendix appears normal. No evidence of bowel wall thickening, distention, or inflammatory changes. Vascular/Lymphatic: No significant vascular findings are present. No enlarged abdominal or pelvic lymph nodes. Mild nonspecific mesenteric stranding and shotty mesenteric lymph nodes in the upper abdomen, nonspecific. Reproductive: Uterus and bilateral adnexa are unremarkable, accounting for presence of IUD. Other: No abdominal wall hernia or abnormality. No abdominopelvic ascites. Musculoskeletal: No acute or significant osseous findings. IMPRESSION: No evidence of obstructive uropathy. Bilateral nephrolithiasis. Electronically Signed   By: Ted Mcalpine M.D.   On: 07/19/2018 16:17   US Pelvic Complete With Transvaginal  Result Date: 07/19/2018 CLINICAL DATA:  Left lower quadrant abdominal pain EXAM: TRANSABDOMINAL AND TRANSVAGINAL ULTRASOUND OF PELVIS TECHNIQUE: Both transabdominal and transvaginal ultrasound examinations of the pelvis were performed. Transabdominal technique was performed for global imaging of the pelvis including uterus, ovaries, adnexal regions, and pelvic cul-de-sac. It was necessary to proceed with endovaginal exam following the transabdominal exam to visualize the uterus endometrium and ovaries. COMPARISON:  CT 07/19/2018 FINDINGS: Uterus Measurements: 8.4 x 3.1 x 4.5 cm. No fibroids or other mass visualized. Endometrium Thickness: 3.4 mm.  Intrauterine device. Right ovary Measurements: 2.7 x 2.5 x 1.8 cm. Normal appearance/no adnexal mass. Left ovary Measurements: 1.2 x 2.5 x 1.4 cm. Normal appearance/no adnexal mass. Other findings No abnormal free fluid. IMPRESSION: Negative pelvic ultrasound Electronically Signed   By: Jasmine Pang M.D.   On: 07/19/2018 18:11    ED Clinical Impression  BV (bacterial vaginosis)  Pelvic pain  Acute left-sided low back pain without sciatica   ED  Assessment/Plan  Outside records reviewed.  As noted in HPI.  Urine is negative for UTI although it does have a few bacteria so we will send off for culture to confirm absence of UTI.  We will check a CBC, BMP.  Given her history of recurrent obstructing nephrolithiasis, ordered a noncontrast CT.  She does have yellowish discharge, CMT and bilateral adnexal tenderness concerning for PID so sent off gonorrhea, chlamydia, wet prep.  Will cover for PID with Rocephin 250 mg IM x1 here, home with 14 days of Flagyl.  Patient states that she is unable to tolerate doxycycline.  States it makes her vomit uncontrollably.  Will give azithromycin 1 g p.o. x1 here, and send her home with prescription for azithromycin 1 g p.o. to take in a week.    Ordering a transvaginal ultrasound that can be done as an outpatient to evaluate for ovarian cyst.  Doubt TOA.  CBC, BMP normal.  Wet prep positive for BV.   Imaging independently reviewed. . Bilateral nephrolithiasis. Nonobstructive 6 mm calculus in the lower pole of the right kidney. Several nonobstructive calculi in the mid and lower polar  region of the left kidney, the largest measuring 3 mm. Normal ureters and urinary bladder.  Ovaries, uterus unremarkable.  See radiology report for details.    Her CT is negative for obstructing nephrolithiasis.  She has no stones in the ureters.  I suspect her symptoms are either from an ovarian cysts or PID, and I have ordered an ultrasound to be done today at the hospital, covered her for PID.  Follow Up with her primary care physician in several days if not getting any better, to the ER if she gets worse.  1730- Discussed u/s with tech and reviewed radiology report. Uterus normal. No fibroids, IUD in place. No TOA, ovarian cyst. No adnexal masses. Discussed these results with patient. Will treat BV and as if this is PID. She will need to follow up with her PMD or with her OBGYN as scheduled in 2 weeks.   Discussed labs,  imaging, MDM, treatment plan, and plan for follow-up with patient. Discussed sn/sx that should prompt return to the ED. patient agrees with plan.   Meds ordered this encounter  Medications  . ketorolac (TORADOL) injection 30 mg  . cefTRIAXone (ROCEPHIN) injection 250 mg  . azithromycin (ZITHROMAX) tablet 1,000 mg  . metroNIDAZOLE (FLAGYL) 500 MG tablet    Sig: Take 1 tablet (500 mg total) by mouth 2 (two) times daily for 14 days.    Dispense:  28 tablet    Refill:  0  . ibuprofen (ADVIL,MOTRIN) 600 MG tablet    Sig: Take 1 tablet (600 mg total) by mouth every 6 (six) hours as needed.    Dispense:  30 tablet    Refill:  0  . azithromycin (ZITHROMAX) 500 MG tablet    Sig: Take 2 tablets (1,000 mg total) by mouth once for 1 dose. Take this next week on 9/13.    Dispense:  2 tablet    Refill:  0    *This clinic note was created using Scientist, clinical (histocompatibility and immunogenetics). Therefore, there may be occasional mistakes despite careful proofreading.   ?   Domenick Gong, MD 07/19/18 1611    Domenick Gong, MD 07/19/18 1612    Domenick Gong, MD 07/19/18 1627    Domenick Gong, MD 07/20/18 1414

## 2018-07-19 NOTE — ED Triage Notes (Signed)
Called [employer] for prior-auth of CT Abd/Pelvis without contrast. Spoke with Stormy Fabian., RN auth# [id number] valid 07/19/18 to 08/18/18.

## 2018-07-19 NOTE — Discharge Instructions (Addendum)
You have nonobstructing kidney stones in both of your kidneys on the CT, but they are up in the kidneys, and not lower down in the ureter where you were having your primary pain.  I have scheduled an ultrasound for today to look for an ovarian cyst or abscess.  Your wet prep was positive for BV, so I am sending you home with 14 days of Flagyl, which will also cover pelvic inflammatory disease.  Take the azithromycin thousand milligrams or 2 tabs at once next week on 9/13.  This will finish your treatment for PID.  You may take 600 mg of ibuprofen with 1 g of Tylenol together 3 or 4 times a day as needed for pain.  Give [place] a working phone number so we can contact you if necessary.  Follow-up with your primary care provider or with your OB/GYN as soon as possible if your symptoms are not getting better, go immediately to the ER for fevers above 100.4, back or pelvic pain not controlled with the Tylenol/ibuprofen combination, or for any other concerns.

## 2018-07-19 NOTE — Telephone Encounter (Signed)
Patient called stating that after taking her antibiotic she has started to feel nauseous. Patient asked if a prescription for Zofran could be sent to her pharmacy. Zofran 8mg  ODT 1 tablet bid as needed for nausea dispense 12 with no refills per Colon Flattery, PA was sent to patient's pharmacy.

## 2018-07-19 NOTE — ED Notes (Signed)
Patient shows no signs of adverse reaction to medication at this time.  

## 2018-07-19 NOTE — ED Triage Notes (Signed)
Patient complains of low back pain, urinary frequency, pelvic pain and fatigue x 2-3 weeks.

## 2018-07-20 LAB — CHLAMYDIA/NGC RT PCR (ARMC ONLY)
CHLAMYDIA TR: NOT DETECTED
N gonorrhoeae: NOT DETECTED

## 2018-07-21 LAB — URINE CULTURE
Culture: <10000 — AB
SPECIAL REQUESTS: NORMAL

## 2018-07-22 ENCOUNTER — Telehealth (HOSPITAL_COMMUNITY): Payer: Self-pay

## 2018-08-06 ENCOUNTER — Encounter: Payer: Self-pay | Admitting: Obstetrics and Gynecology

## 2018-08-16 ENCOUNTER — Encounter: Payer: Self-pay | Admitting: Obstetrics and Gynecology

## 2019-02-10 ENCOUNTER — Ambulatory Visit: Payer: PRIVATE HEALTH INSURANCE | Admitting: Obstetrics & Gynecology

## 2019-04-20 ENCOUNTER — Encounter: Payer: Self-pay | Admitting: Emergency Medicine

## 2019-04-20 ENCOUNTER — Ambulatory Visit
Admission: EM | Admit: 2019-04-20 | Discharge: 2019-04-20 | Disposition: A | Discharge status: Home / Self Care | Payer: PRIVATE HEALTH INSURANCE | Attending: Family Medicine | Admitting: Family Medicine

## 2019-04-20 ENCOUNTER — Other Ambulatory Visit: Payer: Self-pay

## 2019-04-20 DIAGNOSIS — R42 Dizziness and giddiness: Secondary | ICD-10-CM

## 2019-04-20 MED ORDER — DIAZEPAM 2 MG PO TABS: 2.0000 mg | ORAL_TABLET | Freq: Two times a day (BID) | ORAL | 0 refills | Status: DC | PRN

## 2019-04-20 MED ORDER — ONDANSETRON 4 MG PO TBDP: 4.0000 mg | ORAL_TABLET | Freq: Three times a day (TID) | ORAL | 0 refills | Status: DC | PRN

## 2019-04-20 NOTE — Discharge Instructions (Signed)
Meds as prescribed.  If no improvement see [place] EMT.  Take care  Dr. Lacinda Axon

## 2019-04-20 NOTE — ED Triage Notes (Signed)
Patient c/o dizziness and nausea that started yesterday. She states she has had some right ear pain that started 1 week ago.

## 2019-04-20 NOTE — ED Provider Notes (Signed)
[facility]    CSN: [id number] Arrival date & time: 04/20/19  1111   History   Chief Complaint Chief Complaint  Patient presents with  . Dizziness  . Otalgia   HPI  38 year old female presents with the above complaints.  Patient reports that for the past several days she has had an ongoing headache.  She has frequent headaches for which she was taking Topamax.  Been out of her medication for a few weeks.  She has not contacted her physician for refill.  Yesterday she developed sudden onset dizziness and associated nausea.  Dizziness is severe.  Worse with even small movements.  She states that she had a difficult car ride on the way over here.  She is not able to drive.  He has had some ongoing ringing in the ears and right ear pain.  She is unsure if this is contributing.  No reports of hearing loss.  She has had some visual disturbance.  No known relieving factors.  No other associated symptoms.  No other complaints.  PMH, Surgical Hx, Family Hx, Social History reviewed and updated as below.  Past Medical History:  Diagnosis Date  . Allergic rhinitis   . Anxiety   . Asthma   . Colitis   . Collagenous colitis 02/25/2015   02/2015 colonoscopy  . Depression   . Heart murmur    past hx of but not now   . Hypothyroidism   . IBS (irritable bowel syndrome)   . Kidney stones   . Kidney stones   . Meningitis    age 72 -  . Shingles     Patient Active Problem List   Diagnosis Date Noted  . Collagenous colitis 02/25/2015    Past Surgical History:  Procedure Laterality Date  . COLONOSCOPY    . TONSILLECTOMY    . WISDOM TOOTH EXTRACTION      OB History    Gravida  3   Para  1   Term  1   Preterm      AB  2   Living  1     SAB  1   TAB  1   Ectopic      Multiple      Live Births               Home Medications    Prior to Admission medications   Medication Sig Start Date End Date Taking? Authorizing Provider  cetirizine (ZYRTEC) 10  MG tablet Take 10 mg by mouth daily.   Yes [provider]  DULoxetine (CYMBALTA) 30 MG capsule Take 30 mg by mouth daily.   Yes [provider]  fluticasone (FLONASE) 50 MCG/ACT nasal spray Place 2 sprays into both nostrils daily.   Yes [provider]  lamoTRIgine (LAMICTAL) 25 MG tablet Take 25 mg by mouth every evening.  12/02/14  Yes [provider]  levonorgestrel (MIRENA, 52 MG,) 20 MCG/24HR IUD 1 each by Intrauterine route once.   Yes [provider]  levothyroxine (SYNTHROID, LEVOTHROID) 75 MCG tablet Take 75 mcg by mouth daily before breakfast.  04/04/15  Yes [provider]  diazepam (VALIUM) 2 MG tablet Take 1 tablet (2 mg total) by mouth every 12 (twelve) hours as needed (Vertigo). 04/20/19   Coral Spikes, DO  ondansetron (ZOFRAN-ODT) 4 MG disintegrating tablet Take 1 tablet (4 mg total) by mouth every 8 (eight) hours as needed for nausea or vomiting. 04/20/19   Coral Spikes,  DO    Family History Family History  Problem Relation Age of Onset  . Hypertension Mother   . Hypertension Father   . Liver cancer Father   . Alcohol abuse Father   . Cirrhosis Father   . Breast cancer Paternal Aunt   . Colon cancer Neg Hx   . Esophageal cancer Neg Hx   . Rectal cancer Neg Hx     Social History Social History   Tobacco Use  . Smoking status: Never Smoker  . Smokeless tobacco: Never Used  Substance Use Topics  . Alcohol use: Yes    Alcohol/week: 0.0 standard drinks    Comment: rare  . Drug use: No     Allergies   Doxycycline; Morphine and related; Sulfur; and Vicodin [hydrocodone-acetaminophen]   Review of Systems Review of Systems  Eyes: Positive for visual disturbance.  Gastrointestinal: Positive for nausea.  Neurological: Positive for dizziness and headaches.   Physical Exam Triage Vital Signs ED Triage Vitals  Enc Vitals Group     BP 04/20/19 1128 110/74     Pulse Rate 04/20/19 1128 63     Resp 04/20/19 1128  18     Temp 04/20/19 1128 98 F (36.7 C)     Temp Source 04/20/19 1128 Oral     SpO2 04/20/19 1128 100 %     Weight 04/20/19 1126 175 lb (79.4 kg)     Height 04/20/19 1126 5\' 1"  (1.549 m)     Head Circumference --      Peak Flow --      Pain Score 04/20/19 1126 0     Pain Loc --      Pain Edu? --      Excl. in GC? --    Updated Vital Signs BP 110/74 (BP Location: Right Arm)   Pulse 63   Temp 98 F (36.7 C) (Oral)   Resp 18   Ht 5\' 1"  (1.549 m)   Wt 79.4 kg   SpO2 100%   BMI 33.07 kg/m   Visual Acuity Right Eye Distance:   Left Eye Distance:   Bilateral Distance:    Right Eye Near:   Left Eye Near:    Bilateral Near:     Physical Exam Vitals signs and nursing note reviewed.  Constitutional:      General: She is not in acute distress.    Appearance: Normal appearance.  HENT:     Head: Normocephalic and atraumatic.  Eyes:     Conjunctiva/sclera: Conjunctivae normal.     Pupils: Pupils are equal, round, and reactive to light.     Comments: Mild photophobia.  Cardiovascular:     Rate and Rhythm: Normal rate and regular rhythm.  Pulmonary:     Effort: Pulmonary effort is normal.     Breath sounds: Normal breath sounds.  Neurological:     Mental Status: She is alert.  Psychiatric:        Mood and Affect: Mood normal.        Behavior: Behavior normal.    UC Treatments / Results  Labs (all labs ordered are listed, but only abnormal results are displayed) Labs Reviewed - No data to display  EKG None  Radiology No results found.  Procedures Procedures (including critical care time)  Medications Ordered in UC Medications - No data to display  Initial Impression / Assessment and Plan / UC Course  I have reviewed the triage vital signs and the nursing notes.  Pertinent labs & imaging  results that were available during my care of the patient were reviewed by me and considered in my medical decision making (see chart for details).    38 year old female  presents with vertigo.  Treating with Valium and Zofran.  Advised that she may need to see ENT.  Needs to restart Topamax.  Final Clinical Impressions(s) / UC Diagnoses   Final diagnoses:  Vertigo     Discharge Instructions     Meds as prescribed.  If no improvement see [place] EMT.  Take care  Dr. Adriana Simasook    ED Prescriptions    Medication Sig Dispense Auth. Provider   ondansetron (ZOFRAN-ODT) 4 MG disintegrating tablet Take 1 tablet (4 mg total) by mouth every 8 (eight) hours as needed for nausea or vomiting. 20 tablet Payslie Mccaig G, DO   diazepam (VALIUM) 2 MG tablet Take 1 tablet (2 mg total) by mouth every 12 (twelve) hours as needed (Vertigo). 14 tablet Tommie Samsook, Prisca Gearing G, DO     Controlled Substance Prescriptions [place] Controlled Substance Registry consulted? Not Applicable   Tommie SamsCook, Analei Whinery G, DO 04/20/19 1245

## 2019-04-21 DIAGNOSIS — R102 Pelvic and perineal pain: Secondary | ICD-10-CM | POA: Insufficient documentation

## 2019-04-22 ENCOUNTER — Telehealth: Payer: Self-pay | Admitting: Radiology

## 2019-04-22 NOTE — Telephone Encounter (Signed)
spoke with patient to reschedule Annual Exam with Dr Harolyn Rutherford, patient states that she is waiting on a important phone call and will call back to reschedule

## 2019-04-29 ENCOUNTER — Other Ambulatory Visit: Payer: Self-pay | Admitting: Family Medicine

## 2019-09-11 ENCOUNTER — Ambulatory Visit: Payer: PRIVATE HEALTH INSURANCE | Admitting: Obstetrics and Gynecology

## 2019-09-30 ENCOUNTER — Other Ambulatory Visit: Payer: Self-pay

## 2019-09-30 ENCOUNTER — Ambulatory Visit (INDEPENDENT_AMBULATORY_CARE_PROVIDER_SITE_OTHER): Payer: PRIVATE HEALTH INSURANCE | Admitting: Obstetrics & Gynecology

## 2019-09-30 ENCOUNTER — Encounter: Payer: Self-pay | Admitting: Obstetrics & Gynecology

## 2019-09-30 VITALS — BP 114/72 | HR 72 | Wt 176.6 lb

## 2019-09-30 DIAGNOSIS — Z01419 Encounter for gynecological examination (general) (routine) without abnormal findings: Secondary | ICD-10-CM

## 2019-09-30 DIAGNOSIS — Z1151 Encounter for screening for human papillomavirus (HPV): Secondary | ICD-10-CM

## 2019-09-30 NOTE — Progress Notes (Signed)
CYTLast pap 12/07/2014- normal  Pelvic Pain  Mammogram needed Right side breast pain  No STI today

## 2019-09-30 NOTE — Progress Notes (Signed)
Subjective:    Alison Clarke is a 38 y.o. separated P65 (39 yo son)  who presents for an annual exam. The patient has no complaints today. She had some pelvic pain which mimics kidney stones. It has improved. The patient is not currently sexually active. GYN screening history: last pap: was normal. The patient wears seatbelts: yes. The patient participates in regular exercise: yes. Has the patient ever been transfused or tattooed?: yes. The patient reports that there is not domestic violence in her life.   Menstrual History: OB History    Gravida  3   Para  1   Term  1   Preterm      AB  2   Living  1     SAB  1   TAB  1   Ectopic      Multiple      Live Births              Menarche age: 26 No LMP recorded. (Menstrual status: IUD).    The following portions of the patient's history were reviewed and updated as appropriate: allergies, current medications, past family history, past medical history, past social history, past surgical history and problem list.  Review of Systems Pertinent items are noted in HPI.   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 for several months   Objective:    BP 114/72   Pulse 72   Wt 176 lb 9.6 oz (80.1 kg)   BMI 33.37 kg/m   General Appearance:    Alert, cooperative, no distress, appears stated age  Head:    Normocephalic, without obvious abnormality, atraumatic  Eyes:    PERRL, conjunctiva/corneas clear, EOM's intact, fundi    benign, both eyes  Ears:    Normal TM's and external ear canals, both ears  Nose:   Nares normal, septum midline, mucosa normal, no drainage    or sinus tenderness  Throat:   Lips, mucosa, and tongue normal; teeth and gums normal  Neck:   Supple, symmetrical, trachea midline, no adenopathy;    thyroid:  no enlargement/tenderness/nodules; no carotid   bruit or JVD  Back:     Symmetric, no curvature, ROM normal, no CVA tenderness   Lungs:     Clear to auscultation bilaterally, respirations unlabored  Chest Wall:    No tenderness or deformity   Heart:    Regular rate and rhythm, S1 and S2 normal, no murmur, rub   or gallop  Breast Exam:    No tenderness, masses, or nipple abnormality  Abdomen:     Soft, non-tender, bowel sounds active all four quadrants,    no masses, no organomegaly  Genitalia:    Normal female without lesion, discharge or tenderness, 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    Assessment:    Healthy female exam.    Plan:     Mammogram. Thin prep Pap smear.  with cotesting She declines flu vaccine. Healthy lifestyle choices recommended

## 2019-10-01 ENCOUNTER — Ambulatory Visit: Payer: PRIVATE HEALTH INSURANCE

## 2019-10-03 LAB — CYTOLOGY - PAP
Adequacy: ABSENT
Comment: NEGATIVE
Diagnosis: NEGATIVE
High risk HPV: NEGATIVE

## 2019-10-09 ENCOUNTER — Emergency Department (HOSPITAL_COMMUNITY)
Admission: EM | Admit: 2019-10-09 | Discharge: 2019-10-09 | Discharge status: Against Medical Advice | Payer: PRIVATE HEALTH INSURANCE

## 2019-10-09 NOTE — ED Notes (Signed)
Called pt for vitals no answerx1 

## 2019-10-09 NOTE — ED Notes (Signed)
Called pt for vitals, no answer x2 

## 2019-10-19 ENCOUNTER — Encounter (HOSPITAL_BASED_OUTPATIENT_CLINIC_OR_DEPARTMENT_OTHER): Payer: Self-pay | Admitting: *Deleted

## 2019-10-19 ENCOUNTER — Emergency Department (HOSPITAL_BASED_OUTPATIENT_CLINIC_OR_DEPARTMENT_OTHER): Payer: PRIVATE HEALTH INSURANCE

## 2019-10-19 ENCOUNTER — Emergency Department (HOSPITAL_BASED_OUTPATIENT_CLINIC_OR_DEPARTMENT_OTHER)
Admission: EM | Admit: 2019-10-19 | Discharge: 2019-10-19 | Disposition: A | Discharge status: Home / Self Care | Payer: PRIVATE HEALTH INSURANCE | Attending: Emergency Medicine | Admitting: Emergency Medicine

## 2019-10-19 ENCOUNTER — Other Ambulatory Visit: Payer: Self-pay

## 2019-10-19 DIAGNOSIS — N72 Inflammatory disease of cervix uteri: Secondary | ICD-10-CM | POA: Diagnosis not present

## 2019-10-19 DIAGNOSIS — R102 Pelvic and perineal pain: Secondary | ICD-10-CM | POA: Diagnosis present

## 2019-10-19 DIAGNOSIS — Z79899 Other long term (current) drug therapy: Secondary | ICD-10-CM | POA: Insufficient documentation

## 2019-10-19 DIAGNOSIS — E039 Hypothyroidism, unspecified: Secondary | ICD-10-CM | POA: Diagnosis not present

## 2019-10-19 LAB — CBC WITH DIFFERENTIAL/PLATELET
Abs Immature Granulocytes: 0.03 10*3/uL (ref 0.00–0.07)
Basophils Absolute: 0.1 10*3/uL (ref 0.0–0.1)
Basophils Relative: 1 %
Eosinophils Absolute: 0.3 10*3/uL (ref 0.0–0.5)
Eosinophils Relative: 4 %
HCT: 37.8 % (ref 36.0–46.0)
Hemoglobin: 13 g/dL (ref 12.0–15.0)
Immature Granulocytes: 0 %
Lymphocytes Relative: 25 %
Lymphs Abs: 2.1 10*3/uL (ref 0.7–4.0)
MCH: 30.7 pg (ref 26.0–34.0)
MCHC: 34.4 g/dL (ref 30.0–36.0)
MCV: 89.2 fL (ref 80.0–100.0)
Monocytes Absolute: 0.8 10*3/uL (ref 0.1–1.0)
Monocytes Relative: 10 %
Neutro Abs: 5 10*3/uL (ref 1.7–7.7)
Neutrophils Relative %: 60 %
Platelets: 285 10*3/uL (ref 150–400)
RBC: 4.24 MIL/uL (ref 3.87–5.11)
RDW: 12.1 % (ref 11.5–15.5)
WBC: 8.3 10*3/uL (ref 4.0–10.5)
nRBC: 0 % (ref 0.0–0.2)

## 2019-10-19 LAB — WET PREP, GENITAL
Clue Cells Wet Prep HPF POC: NONE SEEN
Sperm: NONE SEEN
Trich, Wet Prep: NONE SEEN
Yeast Wet Prep HPF POC: NONE SEEN

## 2019-10-19 LAB — URINALYSIS, ROUTINE W REFLEX MICROSCOPIC
Bilirubin Urine: NEGATIVE
Glucose, UA: NEGATIVE mg/dL
Hgb urine dipstick: NEGATIVE
Ketones, ur: NEGATIVE mg/dL
Leukocytes,Ua: NEGATIVE
Nitrite: NEGATIVE
Protein, ur: NEGATIVE mg/dL
Specific Gravity, Urine: 1.02 (ref 1.005–1.030)
pH: 7 (ref 5.0–8.0)

## 2019-10-19 LAB — BASIC METABOLIC PANEL
Anion gap: 9 (ref 5–15)
BUN: 14 mg/dL (ref 6–20)
CO2: 21 mmol/L — ABNORMAL LOW (ref 22–32)
Calcium: 9 mg/dL (ref 8.9–10.3)
Chloride: 108 mmol/L (ref 98–111)
Creatinine, Ser: 0.83 mg/dL (ref 0.44–1.00)
GFR calc Af Amer: >60 mL/min (ref >60)
GFR calc non Af Amer: >60 mL/min (ref >60)
Glucose, Bld: 90 mg/dL (ref 70–99)
Potassium: 3.1 mmol/L — ABNORMAL LOW (ref 3.5–5.1)
Sodium: 138 mmol/L (ref 135–145)

## 2019-10-19 LAB — PREGNANCY, URINE: Preg Test, Ur: NEGATIVE

## 2019-10-19 MED ORDER — FENTANYL CITRATE (PF) 100 MCG/2ML IJ SOLN: 25.0000 ug | Freq: Once | INTRAMUSCULAR | Status: DC

## 2019-10-19 MED ORDER — OXYCODONE-ACETAMINOPHEN 5-325 MG PO TABS: 2.0000 | ORAL_TABLET | ORAL | 0 refills | Status: DC | PRN

## 2019-10-19 MED ORDER — IOHEXOL 300 MG/ML  SOLN
100.0000 mL | Freq: Once | INTRAMUSCULAR | Status: AC | PRN
  Administered 2019-10-19: 100 mL via INTRAVENOUS

## 2019-10-19 MED ORDER — OXYCODONE-ACETAMINOPHEN 5-325 MG PO TABS
1.0000 | ORAL_TABLET | Freq: Once | ORAL | Status: AC
  Administered 2019-10-19: 1 via ORAL
  Filled 2019-10-19: qty 1

## 2019-10-19 MED ORDER — ONDANSETRON HCL 4 MG/2ML IJ SOLN
4.0000 mg | Freq: Once | INTRAMUSCULAR | Status: AC
  Administered 2019-10-19: 4 mg via INTRAVENOUS
  Filled 2019-10-19: qty 2

## 2019-10-19 MED ORDER — CEFTRIAXONE SODIUM 250 MG IJ SOLR
250.0000 mg | Freq: Once | INTRAMUSCULAR | Status: AC
  Administered 2019-10-19: 250 mg via INTRAMUSCULAR
  Filled 2019-10-19: qty 250

## 2019-10-19 MED ORDER — AZITHROMYCIN 250 MG PO TABS
1000.0000 mg | ORAL_TABLET | Freq: Once | ORAL | Status: AC
  Administered 2019-10-19: 1000 mg via ORAL
  Filled 2019-10-19: qty 4

## 2019-10-19 MED ORDER — LIDOCAINE HCL (PF) 1 % IJ SOLN
INTRAMUSCULAR | Status: AC
  Administered 2019-10-19: 5 mL
  Filled 2019-10-19: qty 5

## 2019-10-19 MED ORDER — MORPHINE SULFATE (PF) 4 MG/ML IV SOLN
4.0000 mg | Freq: Once | INTRAVENOUS | Status: DC
  Filled 2019-10-19: qty 1

## 2019-10-19 MED ORDER — ONDANSETRON 4 MG PO TBDP: 4.0000 mg | ORAL_TABLET | Freq: Three times a day (TID) | ORAL | 0 refills | Status: DC | PRN

## 2019-10-19 MED ORDER — AZITHROMYCIN 250 MG PO TABS: 1000.0000 mg | ORAL_TABLET | Freq: Every day | ORAL | 0 refills | Status: AC

## 2019-10-19 MED ORDER — METRONIDAZOLE 500 MG PO TABS
500.0000 mg | ORAL_TABLET | Freq: Once | ORAL | Status: AC
  Administered 2019-10-19: 500 mg via ORAL
  Filled 2019-10-19: qty 1

## 2019-10-19 MED ORDER — METRONIDAZOLE 500 MG PO TABS: 500.0000 mg | ORAL_TABLET | Freq: Two times a day (BID) | ORAL | 0 refills | Status: DC

## 2019-10-19 MED ORDER — SODIUM CHLORIDE 0.9 % IV BOLUS
1000.0000 mL | Freq: Once | INTRAVENOUS | Status: AC
  Administered 2019-10-19: 1000 mL via INTRAVENOUS

## 2019-10-19 NOTE — Discharge Instructions (Signed)
Take the pain medicine as needed as well as the nausea medicine.  Take the Flagyl daily as prescribed.  The azithromycin you will need to take before tablets in 1 week.  Please keep your appointment with OB/GYN on Thursday.  Return to the emergency department for any new or worsening symptoms.

## 2019-10-19 NOTE — ED Provider Notes (Addendum)
[facility] EMERGENCY DEPARTMENT Provider Note   CSN: [id number] Arrival date & time: 10/19/19  2001    History   Chief Complaint Chief Complaint  Patient presents with  . Abdominal Pain    HPI Alison Clarke is a 38 y.o. female with past medical history significant for anxiety, colitis, recurrent kidney stones presents for evaluation of pelvic pain.  Patient states she has had pelvic pain x2 weeks.  This is been worsening over the last 2-day.  Pain worse with movement.  Patient states she has a thin, nonodorous vaginal discharge.  She is sexually active with one partner and they are monogamous.  She has no concerns for STDs.  Patient states she is also had some persistent nausea.  She has IUD for pregnancy prevention.  Denies fever, chills, emesis, chest pain, shortness of breath flank pain, dysuria, hematuria, constipation, melena or bright red blood per rectum.  Patient states she has diarrhea chronically.  Rates her pain a 8/10.  Denies additional aggravating or alleviating factors.  History obtained from patient and past medical records.  No interpreter is used.  Per last OB/GYN note patient does have chronic midline pelvic pain.     HPI  Past Medical History:  Diagnosis Date  . Allergic rhinitis   . Anxiety   . Asthma   . Colitis   . Collagenous colitis 02/25/2015   02/2015 colonoscopy  . Depression   . Heart murmur    past hx of but not now   . Hypothyroidism   . IBS (irritable bowel syndrome)   . Kidney stones   . Kidney stones   . Meningitis    age 47 -  . Shingles     Patient Active Problem List   Diagnosis Date Noted  . Collagenous colitis 02/25/2015    Past Surgical History:  Procedure Laterality Date  . COLONOSCOPY    . TONSILLECTOMY    . WISDOM TOOTH EXTRACTION       OB History    Gravida  3   Para  1   Term  1   Preterm      AB  2   Living  1     SAB  1   TAB  1   Ectopic      Multiple      Live Births                Home Medications    Prior to Admission medications   Medication Sig Start Date End Date Taking? Authorizing Provider  azithromycin (ZITHROMAX) 250 MG tablet Take 4 tablets (1,000 mg total) by mouth daily for 1 day. Take first 2 tablets together, then 1 every day until finished. 10/19/19 10/20/19  ,  A, PA-C  cetirizine (ZYRTEC) 10 MG tablet Take 10 mg by mouth daily.    [provider]  diazepam (VALIUM) 2 MG tablet Take 1 tablet (2 mg total) by mouth every 12 (twelve) hours as needed (Vertigo). 04/20/19   Tommie Sams, DO  DULoxetine (CYMBALTA) 30 MG capsule Take 30 mg by mouth daily.    [provider]  fluticasone (FLONASE) 50 MCG/ACT nasal spray Place 2 sprays into both nostrils daily.    [provider]  lamoTRIgine (LAMICTAL) 25 MG tablet Take 25 mg by mouth every evening.  12/02/14   [provider]  levonorgestrel (MIRENA, 52 MG,) 20 MCG/24HR IUD 1 each by Intrauterine route once.    [provider]  levothyroxine (  SYNTHROID, LEVOTHROID) 75 MCG tablet Take 75 mcg by mouth daily before breakfast.  04/04/15   [provider]  metroNIDAZOLE (FLAGYL) 500 MG tablet Take 1 tablet (500 mg total) by mouth 2 (two) times daily. 10/19/19   ,  A, PA-C  ondansetron (ZOFRAN ODT) 4 MG disintegrating tablet Take 1 tablet (4 mg total) by mouth every 8 (eight) hours as needed for nausea or vomiting. 10/19/19   ,  A, PA-C  oxyCODONE-acetaminophen (PERCOCET/ROXICET) 5-325 MG tablet Take 2 tablets by mouth every 4 (four) hours as needed for severe pain. 10/19/19   ,  A, PA-C    Family History Family History  Problem Relation Age of Onset  . Hypertension Mother   . Hypertension Father   . Liver cancer Father   . Alcohol abuse Father   . Cirrhosis Father   . Breast cancer Paternal Aunt   . Colon cancer Neg Hx   . Esophageal cancer Neg Hx   . Rectal cancer Neg Hx     Social History Social  History   Tobacco Use  . Smoking status: Never Smoker  . Smokeless tobacco: Never Used  Substance Use Topics  . Alcohol use: Yes    Alcohol/week: 0.0 standard drinks    Comment: rare  . Drug use: No   Allergies   Doxycycline, Morphine and related, Sulfur, and Vicodin [hydrocodone-acetaminophen]   Review of Systems Review of Systems  Constitutional: Negative.   HENT: Negative.   Respiratory: Negative.   Cardiovascular: Negative.   Gastrointestinal: Negative.   Genitourinary: Positive for pelvic pain and vaginal discharge. Negative for decreased urine volume, difficulty urinating, dysuria, flank pain, frequency, genital sores, hematuria, menstrual problem, urgency, vaginal bleeding and vaginal pain.  Musculoskeletal: Negative.   Skin: Negative.   Neurological: Negative.   All other systems reviewed and are negative.  Physical Exam Updated Vital Signs BP 117/64 (BP Location: Left Arm)   Pulse 83   Temp 98.9 F (37.2 C) (Oral)   Resp 19   Ht 5\' 1"  (1.549 m)   Wt 78 kg   SpO2 100%   BMI 32.50 kg/m   Physical Exam Vitals signs and nursing note reviewed.  Constitutional:      General: She is not in acute distress.    Appearance: She is well-developed. She is not ill-appearing or toxic-appearing.  HENT:     Head: Normocephalic and atraumatic.     Mouth/Throat:     Mouth: Mucous membranes are moist.  Eyes:     Pupils: Pupils are equal, round, and reactive to light.  Neck:     Musculoskeletal: Normal range of motion.  Cardiovascular:     Rate and Rhythm: Normal rate.     Heart sounds: Normal heart sounds.  Pulmonary:     Effort: Pulmonary effort is normal. No respiratory distress.     Breath sounds: Normal breath sounds.  Abdominal:     General: Bowel sounds are normal. There is no distension.     Palpations: Abdomen is soft.     Tenderness: There is abdominal tenderness in the right lower quadrant, suprapubic area and left lower quadrant. There is no right CVA  tenderness, left CVA tenderness, guarding or rebound. Negative signs include Murphy's sign and McBurney's sign.     Hernia: No hernia is present.  Genitourinary:    Comments: Normal appearing external female genitalia without rashes or lesions, normal vaginal epithelium. Normal appearing cervix with yellow discharge. No cervical petechiae. Cervical os is closed. There is  no bleeding noted at the os. No odor. Bimanual: Moderate CMT tenderness nontender.  No palpable adnexal masses or tenderness. Uterus midline and not fixed. Rectovaginal exam was deferred.  No cystocele or rectocele noted. No pelvic lymphadenopathy noted. Wet prep was obtained.  Cultures for gonorrhea and chlamydia collected. Exam performed with chaperone in room. Musculoskeletal: Normal range of motion.  Skin:    General: Skin is warm and dry.     Capillary Refill: Capillary refill takes less than 2 seconds.     Comments: Brisk cap refil. No rashes or lesions.  Neurological:     Mental Status: She is alert.    ED Treatments / Results  Labs (all labs ordered are listed, but only abnormal results are displayed) Labs Reviewed  WET PREP, GENITAL - Abnormal; Notable for the following components:      Result Value   WBC, Wet Prep HPF POC MANY (*)    All other components within normal limits  BASIC METABOLIC PANEL - Abnormal; Notable for the following components:   Potassium 3.1 (*)    CO2 21 (*)    All other components within normal limits  URINE CULTURE  URINALYSIS, ROUTINE W REFLEX MICROSCOPIC  PREGNANCY, URINE  CBC WITH DIFFERENTIAL/PLATELET  RPR  HIV ANTIBODY (ROUTINE TESTING W REFLEX)  GC/CHLAMYDIA PROBE AMP ([place]) NOT AT [facility]    EKG None  Radiology Ct Abdomen Pelvis W Contrast  Result Date: 10/19/2019 CLINICAL DATA:  Abdominal pain. EXAM: CT ABDOMEN AND PELVIS WITH CONTRAST TECHNIQUE: Multidetector CT imaging of the abdomen and pelvis was performed using the standard protocol following bolus  administration of intravenous contrast. CONTRAST:  100mL OMNIPAQUE IOHEXOL 300 MG/ML  SOLN COMPARISON:  07/19/2018 FINDINGS: Lower chest: The lung bases are clear. The heart size is normal. Hepatobiliary: The liver is normal. Normal gallbladder.There is no biliary ductal dilation. Pancreas: Normal contours without ductal dilatation. No peripancreatic fluid collection. Spleen: No splenic laceration or hematoma. Adrenals/Urinary Tract: --Adrenal glands: No adrenal hemorrhage. --Right kidney/ureter: There is a nonobstructing stone in the lower pole the right kidney measuring approximately 5 mm. --Left kidney/ureter: There are multiple nonobstructing stones in the lower pole the left kidney measuring up to approximately 5 mm. --Urinary bladder: The bladder is decompressed which limits evaluation. Stomach/Bowel: --Stomach/Duodenum: No hiatal hernia or other gastric abnormality. Normal duodenal course and caliber. --Small bowel: No dilatation or inflammation. --Colon: There is mild diffuse wall thickening throughout the colon. There is a large amount of poorly formed stool throughout the colon with evidence for air-fluid levels. --Appendix: Normal. Vascular/Lymphatic: Normal course and caliber of the major abdominal vessels. --No retroperitoneal lymphadenopathy. --No mesenteric lymphadenopathy. --No pelvic or inguinal lymphadenopathy. Reproductive: There is an IUD in place. Other: No ascites or free air. The abdominal wall is normal. Musculoskeletal. No acute displaced fractures. IMPRESSION: 1. Mild diffuse wall thickening throughout the colon. Findings are consistent with infectious or inflammatory colitis. 2. Bilateral nonobstructive nephrolithiasis. 3. Normal appendix in the right lower quadrant. Electronically Signed   By: Katherine Mantlehristopher  Green M.D.   On: 10/19/2019 22:33   [place] Pelvic Complete With Transvaginal  Result Date: 10/19/2019 CLINICAL DATA:  Suprapubic pain.  Vaginal pain. EXAM: TRANSABDOMINAL AND  TRANSVAGINAL ULTRASOUND OF PELVIS TECHNIQUE: Both transabdominal and transvaginal ultrasound examinations of the pelvis were performed. Transabdominal technique was performed for global imaging of the pelvis including uterus, ovaries, adnexal regions, and pelvic cul-de-sac. It was necessary to proceed with endovaginal exam following the transabdominal exam to visualize the ovaries. COMPARISON:  07/19/2018  FINDINGS: Uterus Measurements: 8.2 x 3.3 x 4.7 cm = volume: 67.5 mL. No fibroids or other mass visualized. Endometrium Thickness: 4 mm. An IUD is noted. No focal abnormality visualized. Right ovary Not visualized Left ovary Not visualized Other findings No abnormal free fluid. IMPRESSION: 1. The ovaries were not visualized. 2. No acute sonographic abnormality detected given the above limitation. 3. There is an IUD within the endometrial canal which appears well position. Electronically Signed   By: Katherine Mantle M.D.   On: 10/19/2019 21:30    Procedures Procedures (including critical care time)  Medications Ordered in ED Medications  sodium chloride 0.9 % bolus 1,000 mL (1,000 mLs Intravenous New Bag/Given 10/19/19 2127)  ondansetron (ZOFRAN) injection 4 mg (4 mg Intravenous Given 10/19/19 2130)  oxyCODONE-acetaminophen (PERCOCET/ROXICET) 5-325 MG per tablet 1 tablet (1 tablet Oral Given 10/19/19 2159)  iohexol (OMNIPAQUE) 300 MG/ML solution 100 mL (100 mLs Intravenous Contrast Given 10/19/19 2210)  cefTRIAXone (ROCEPHIN) injection 250 mg (250 mg Intramuscular Given 10/19/19 2318)  metroNIDAZOLE (FLAGYL) tablet 500 mg (500 mg Oral Given 10/19/19 2317)  azithromycin (ZITHROMAX) tablet 1,000 mg (1,000 mg Oral Given 10/19/19 2317)  lidocaine (PF) (XYLOCAINE) 1 % injection (5 mLs  Given 10/19/19 2319)   Initial Impression / Assessment and Plan / ED Course  I have reviewed the triage vital signs and the nursing notes.  Pertinent labs & imaging results that were available during my care of the patient  were reviewed by me and considered in my medical decision making (see chart for details).  38 year old female appears otherwise well presents for evaluation of pelvic pain over the last 2 weeks however worse over the last 24 hours.  She is afebrile, nonseptic, non-ill-appearing.  She is followed by [facility] OB[facility], Dr. Marice Clarke.  Per the last note patient does have chronic intermittent pelvic pain.  They cannot get her in until Thursday recommended she come to the emergency department for evaluation.  She has history of pelvic pain in the past.  Possible PID based on notes?  Had negative ultrasound and CT scan at that time.  She does have history of recurrent nephrolithiasis however denies any flank pain, hematuria or dysuria.  Heart and lungs clear.  Persistent nausea without emesis.  No fever, chills.  Abdomen generalized tenderness to suprapubic resion.  No LLQ or RLQ pain. Negative psoas, obturator sign.  Plan for pelvic exam, ultrasound, labs, pain management and reevaluate.  GU exam with moderate yellow discharge in vault.  She has moderate cervical motion tenderness.  No adnexal tenderness. Ultrasound without any significant findings.  IUD in place. Patient is insistent on CT scan states she has had this pain before and they told her as PID and she did not think that was it.  Will order.  Wet prep with many WBC. CT AP with colitis. Patient denies any infectious symptoms and states her stools have not changed from her baseline.  Does not want antibiotics for colitis at this time which I feel is reasonable I feel her pain is more likely from cervicitis or PID.  No focal left lower quadrant pain or leukocytosis.  Does not have any evidence of TOA. Tolerating PO intake without difficulty.  Will treat as PID and have her follow up with ObGyn. Short course of pain medicine given with zofran for home use.  Discussed this with patient.  She is comfortable with plan.  Patient is nontoxic, nonseptic  appearing, in no apparent distress.  Patient's pain and other symptoms  adequately managed in emergency department.  Fluid bolus given.  Labs, imaging and vitals reviewed.  Patient does not meet the SIRS or Sepsis criteria.  On repeat exam patient does not have a surgical abdomin and there are no peritoneal signs.  No indication of appendicitis, bowel obstruction, bowel perforation, cholecystitis, diverticulitis, PID or ectopic pregnancy.  Patient discharged home with symptomatic treatment and given strict instructions for follow-up with their primary care physician.  I have also discussed reasons to return immediately to the ER.  Patient expresses understanding and agrees with plan.     Final Clinical Impressions(s) / ED Diagnoses   Final diagnoses:  Cervicitis    ED Discharge Orders         Ordered    metroNIDAZOLE (FLAGYL) 500 MG tablet  2 times daily     10/19/19 2251    azithromycin (ZITHROMAX) 250 MG tablet  Daily     10/19/19 2251    oxyCODONE-acetaminophen (PERCOCET/ROXICET) 5-325 MG tablet  Every 4 hours PRN     10/19/19 2251    ondansetron (ZOFRAN ODT) 4 MG disintegrating tablet  Every 8 hours PRN     10/19/19 2251           ,  A, PA-C 10/19/19 2257    ,  A, PA-C 10/19/19 2322    Quintella Reichert, MD 10/21/19 1426

## 2019-10-19 NOTE — ED Notes (Signed)
Pt to US.

## 2019-10-19 NOTE — ED Triage Notes (Signed)
Pt reports pelvic pain x 2 weeks. Worse over the last few hours. C/o bloating. Pt reports thin vaginal discharge last night. Pt has pain with ambulation

## 2019-10-20 LAB — HIV ANTIBODY (ROUTINE TESTING W REFLEX): HIV Screen 4th Generation wRfx: NONREACTIVE

## 2019-10-21 LAB — URINE CULTURE: Culture: NO GROWTH

## 2019-10-21 LAB — RPR: RPR Ser Ql: NONREACTIVE

## 2019-10-21 LAB — GC/CHLAMYDIA PROBE AMP (~~LOC~~) NOT AT ARMC
Chlamydia: NEGATIVE
Neisseria Gonorrhea: NEGATIVE

## 2019-10-23 ENCOUNTER — Other Ambulatory Visit: Payer: Self-pay

## 2019-10-23 ENCOUNTER — Encounter: Payer: Self-pay | Admitting: Obstetrics and Gynecology

## 2019-10-23 ENCOUNTER — Ambulatory Visit (INDEPENDENT_AMBULATORY_CARE_PROVIDER_SITE_OTHER): Payer: PRIVATE HEALTH INSURANCE | Admitting: Obstetrics and Gynecology

## 2019-10-23 VITALS — BP 127/84 | HR 72 | Wt 181.0 lb

## 2019-10-23 DIAGNOSIS — K529 Noninfective gastroenteritis and colitis, unspecified: Secondary | ICD-10-CM | POA: Diagnosis not present

## 2019-10-23 DIAGNOSIS — B3731 Acute candidiasis of vulva and vagina: Secondary | ICD-10-CM

## 2019-10-23 DIAGNOSIS — N200 Calculus of kidney: Secondary | ICD-10-CM | POA: Diagnosis not present

## 2019-10-23 DIAGNOSIS — B373 Candidiasis of vulva and vagina: Secondary | ICD-10-CM | POA: Diagnosis not present

## 2019-10-23 DIAGNOSIS — Z975 Presence of (intrauterine) contraceptive device: Secondary | ICD-10-CM | POA: Diagnosis not present

## 2019-10-23 DIAGNOSIS — R1030 Lower abdominal pain, unspecified: Secondary | ICD-10-CM

## 2019-10-23 MED ORDER — METRONIDAZOLE 500 MG PO TABS: 500.0000 mg | ORAL_TABLET | Freq: Two times a day (BID) | ORAL | 0 refills | Status: DC

## 2019-10-23 MED ORDER — ONDANSETRON 4 MG PO TBDP: 4.0000 mg | ORAL_TABLET | Freq: Three times a day (TID) | ORAL | 0 refills | Status: DC | PRN

## 2019-10-23 MED ORDER — FLUCONAZOLE 150 MG PO TABS: 150.0000 mg | ORAL_TABLET | Freq: Once | ORAL | 0 refills | Status: AC

## 2019-10-23 MED ORDER — CIPROFLOXACIN HCL 500 MG PO TABS: 500.0000 mg | ORAL_TABLET | Freq: Two times a day (BID) | ORAL | 0 refills | Status: DC

## 2019-10-23 NOTE — Progress Notes (Signed)
Patient presented to the office with stabbing pelvic pain since Thanksgiving. She was seen at the ER.

## 2019-10-23 NOTE — Progress Notes (Signed)
Obstetrics and Gynecology Established Patient Evaluation  Appointment Date: 10/23/2019  OBGYN Clinic: [facility]  Primary Care Provider: Remi Haggard  Chief Complaint: ED follow up  History of Present Illness: Alison Clarke is a 38 y.o. Caucasian [id number] (No LMP recorded (lmp unknown). (Menstrual status: IUD).), seen for the above chief complaint.  Patient went to ED on 12/6 for persistent abdominal pain. She went to the ED on 11/26 after calling us with lower abdominal pain but due to the long wait she left; she went back on 12/6 for persistent and worsened abdominal pain. U/s there was negative with IUD in appropriate spot and CT scan showed colitis (new) and persistent b/l small stones; there was no e/o TOA  Labs and swabs were negative and ED treated her for PID; she states she is taking the flagyl they sent her home on.   Appointment today was already made in the past so patient kept it. Still with some nausea w/o vomiting and lower abdominal pain that is somewhat better. No fevers, chills, vaginal bleeding, discharge.    Review of Systems: as noted in the History of Present Illness.   Past Medical History:  Past Medical History:  Diagnosis Date  . Allergic rhinitis   . Anxiety   . Asthma   . Colitis   . Collagenous colitis 02/25/2015   02/2015 colonoscopy  . Depression   . Heart murmur    past hx of but not now   . Hypothyroidism   . IBS (irritable bowel syndrome)   . Kidney stones   . Meningitis    age 18 -  . Shingles     Past Surgical History:  Past Surgical History:  Procedure Laterality Date  . COLONOSCOPY    . TONSILLECTOMY    . WISDOM TOOTH EXTRACTION      Past Obstetrical History:  OB History  Gravida Para Term Preterm AB Living  3 1 1   2 1   SAB TAB Ectopic Multiple Live Births  1 1          # Outcome Date GA Lbr Len/2nd Weight Sex Delivery Anes PTL Lv  3 TAB           2 SAB           1 Term             Past  Gynecological History: As per HPI. Periods: none due to Mirena History of Pap Smear(s): Yes.   Last pap 2020, which was negative She is currently using Mirena (placed 09/2015) for contraception.   Social History:  Social History   Socioeconomic History  . Marital status: Married    Spouse name: Not on file  . Number of children: Not on file  . Years of education: Not on file  . Highest education level: Not on file  Occupational History  . Not on file  Tobacco Use  . Smoking status: Never Smoker  . Smokeless tobacco: Never Used  Substance and Sexual Activity  . Alcohol use: Yes    Alcohol/week: 0.0 standard drinks    Comment: rare  . Drug use: No  . Sexual activity: Yes    Partners: Male    Birth control/protection: OCP, I.U.D.  Other Topics Concern  . 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 Difficulty of Paying Living Expenses: Not on file  Food Insecurity:   . Worried About Programme researcher, broadcasting/film/videounning Out of Food in the Last Year: Not on file  . Ran Out of Food in the Last Year: Not on file  Transportation Needs:   . Lack of Transportation (Medical): Not on file  . Lack of Transportation (Non-Medical): Not on file  Physical Activity:   . Days of Exercise per Week: Not on file  . Minutes of Exercise per Session: Not on file  Stress:   . Feeling of Stress : Not on file  Social Connections:   . Frequency of Communication with Friends and Family: Not on file  . Frequency of Social Gatherings with Friends and Family: Not on file  . Attends Religious Services: Not on file  . Active Member of Clubs or Organizations: Not on file  . Attends BankerClub or Organization Meetings: Not on file  . Marital Status: Not on file  Intimate Partner Violence:   . Fear of Current or Ex-Partner: Not on file  . Emotionally Abused: Not on file  .  Physically Abused: Not on file  . Sexually Abused: Not on file    Family History:  Family History  Problem Relation Age of Onset  . Hypertension Mother   . Hypertension Father   . Liver cancer Father   . Alcohol abuse Father   . Cirrhosis Father   . Breast cancer Paternal Aunt   . Colon cancer Neg Hx   . Esophageal cancer Neg Hx   . Rectal cancer Neg Hx     Medications Blenda Pealshonda W. Byrd had no medications administered during this visit. Current Outpatient Medications  Medication Sig Dispense Refill  . cetirizine (ZYRTEC) 10 MG tablet Take 10 mg by mouth daily.    . DULoxetine (CYMBALTA) 30 MG capsule Take 30 mg by mouth daily.    . fluticasone (FLONASE) 50 MCG/ACT nasal spray Place 2 sprays into both nostrils daily.    Marland Kitchen. lamoTRIgine (LAMICTAL) 25 MG tablet Take 25 mg by mouth every evening.   1  . levonorgestrel (MIRENA, 52 MG,) 20 MCG/24HR IUD 1 each by Intrauterine route once.    . metroNIDAZOLE (FLAGYL) 500 MG tablet Take 1 tablet (500 mg total) by mouth 2 (two) times daily. 14 tablet 0  . oxyCODONE-acetaminophen (PERCOCET/ROXICET) 5-325 MG tablet Take 2 tablets by mouth every 4 (four) hours as needed for severe pain. 15 tablet 0  . diazepam (VALIUM) 2 MG tablet Take 1 tablet (2 mg total) by mouth every 12 (twelve) hours as needed (Vertigo). (Patient not taking: Reported on 10/23/2019) 14 tablet 0  . levothyroxine (SYNTHROID, LEVOTHROID) 75 MCG tablet Take 75 mcg by mouth daily before breakfast.   0  . ondansetron (ZOFRAN ODT) 4 MG disintegrating tablet Take 1 tablet (4 mg total) by mouth every 8 (eight) hours as needed for nausea or vomiting. (Patient not taking: Reported on 10/23/2019) 20 tablet 0   No current facility-administered medications for this visit.    Allergies Doxycycline, Morphine and related, Sulfur, and Vicodin [hydrocodone-acetaminophen]   Physical Exam:  BP 127/84   Pulse 72   Wt 181 lb (82.1 kg)   LMP  (LMP Unknown)   BMI 34.20 kg/m  Body mass index  is 34.2 kg/m. General appearance: Well nourished, well developed female in no acute distress.  Cardiovascular: normal s1 and s2.  No murmurs, rubs or gallops. Respiratory:  Clear to auscultation bilateral. Normal respiratory effort  Abdomen: positive bowel sounds and no masses, hernias; diffusely non tender to palpation, non distended Neuro/Psych:  Normal mood and affect.  Skin:  Warm and dry.  Lymphatic:  No inguinal lymphadenopathy.   Pelvic exam: is not limited by body habitus EGBUS: within normal limits, except for b/l labia majora erythema Vagina: within normal limits. + cottage cheese like white d/c in vault, no blood Cervix: normal appearing cervix without tenderness, discharge or lesions. IUD strings seen tucked into fornices Uterus:  nonenlarged and non tender and Adnexa:  normal adnexa and no mass, fullness, tenderness Rectovaginal: deferred  Laboratory: as per HPI  Radiology: as per HPI  Assessment: pt stable to slightly improved  Plan:  1. Bilateral nephrolithiasis Likely not etiology since stable all the way back to 2016 and pt states it doesn't feel like stones and ucx was negative, no blood seen on u/a  2. IUD (intrauterine device) in place D/w her that I don't think the etiology was gyn given normal IUD position and no e/o TOA, also since presumptive PID tx hasn't really helped much. I told her that I think it's much more likely a colitis and her tx thus far I don't think would be sufficient to tx that. Will continue her on the flagyl and add on cipro. Pt told to make appt with PCP for next week (pt in process of changing GI providers) in case new regimen doesn't help. ED precautions given.   3. Vulvovaginal candidiasis Diflucan sent in  4. Colitis See above.   5. Lower abdominal pain  RTC PRN  Cornelia Copa MD Attending Center for [facility])

## 2019-11-17 ENCOUNTER — Other Ambulatory Visit (INDEPENDENT_AMBULATORY_CARE_PROVIDER_SITE_OTHER): Payer: Commercial Managed Care - PPO

## 2019-11-17 ENCOUNTER — Encounter: Payer: Self-pay | Admitting: Physician Assistant

## 2019-11-17 ENCOUNTER — Ambulatory Visit (INDEPENDENT_AMBULATORY_CARE_PROVIDER_SITE_OTHER): Payer: Commercial Managed Care - PPO | Admitting: Physician Assistant

## 2019-11-17 ENCOUNTER — Encounter

## 2019-11-17 VITALS — BP 98/68 | HR 84 | Temp 98.1°F | Ht 61.0 in | Wt 179.6 lb

## 2019-11-17 DIAGNOSIS — Z1159 Encounter for screening for other viral diseases: Secondary | ICD-10-CM

## 2019-11-17 DIAGNOSIS — K52839 Microscopic colitis, unspecified: Secondary | ICD-10-CM | POA: Diagnosis not present

## 2019-11-17 DIAGNOSIS — R197 Diarrhea, unspecified: Secondary | ICD-10-CM

## 2019-11-17 DIAGNOSIS — R9389 Abnormal findings on diagnostic imaging of other specified body structures: Secondary | ICD-10-CM

## 2019-11-17 DIAGNOSIS — R109 Unspecified abdominal pain: Secondary | ICD-10-CM

## 2019-11-17 LAB — BASIC METABOLIC PANEL
BUN: 13 mg/dL (ref 6–23)
CO2: 24 mEq/L (ref 19–32)
Calcium: 9.4 mg/dL (ref 8.4–10.5)
Chloride: 105 mEq/L (ref 96–112)
Creatinine, Ser: 0.77 mg/dL (ref 0.40–1.20)
GFR: 82.66 mL/min (ref >60.00)
Glucose, Bld: 95 mg/dL (ref 70–99)
Potassium: 3.7 mEq/L (ref 3.5–5.1)
Sodium: 137 mEq/L (ref 135–145)

## 2019-11-17 LAB — SEDIMENTATION RATE: Sed Rate: 6 mm/hr (ref 0–20)

## 2019-11-17 LAB — HIGH SENSITIVITY CRP: CRP, High Sensitivity: 1.45 mg/L (ref 0.000–5.000)

## 2019-11-17 MED ORDER — DICYCLOMINE HCL 10 MG PO CAPS: 10.0000 mg | ORAL_CAPSULE | Freq: Four times a day (QID) | ORAL | 2 refills | Status: DC | PRN

## 2019-11-17 NOTE — Patient Instructions (Signed)
If you are age 41 or older, your body mass index should be between 23-30. Your Body mass index is 33.93 kg/m. If this is out of the aforementioned range listed, please consider follow up with your Primary Care Provider.  If you are age 59 or younger, your body mass index should be between 19-25. Your Body mass index is 33.93 kg/m. If this is out of the aformentioned range listed, please consider follow up with your Primary Care Provider.   You have been scheduled for a colonoscopy. Please follow written instructions given to you at your visit today.  Please pick up your prep supplies at the pharmacy within the next 1-3 days. If you use inhalers (even only as needed), please bring them with you on the day of your procedure. Your physician has requested that you go to www.startemmi.com and enter the access code given to you at your visit today. This web site gives a general overview about your procedure. However, you should still follow specific instructions given to you by our office regarding your preparation for the procedure.  We have sent the following medications to your pharmacy for you to pick up at your convenience: Bentyl  Try Pepcid 20 mg twice daily. ( this is over-the-counter)  May Continue Carafate 1 gm three times daily.  Your provider has requested that you go to the basement level for lab work before leaving today. Press "B" on the elevator. The lab is located at the first door on the left as you exit the elevator.  Thank you for choosing me and [facility].   Amy Esterwood, PA-C  Due to recent changes in healthcare laws, you may see the results of your imaging and laboratory studies on MyChart before your provider has had a chance to review them.  We understand that in some cases there may be results that are confusing or concerning to you. Not all laboratory results come back in the same time frame and the provider may be waiting for multiple results in order to  interpret others.  Please give [place] 48 hours in order for your provider to thoroughly review all the results before contacting the office for clarification of your results.

## 2019-11-17 NOTE — Progress Notes (Signed)
Subjective:    Patient ID: Alison Clarke, female    DOB: [birth year], 39 y.o.   MRN: [id number]  HPI Alison Clarke is a pleasant 39 year old white female, established with Dr. Carlean Purl and last seen in our office in 2016.  She has a diagnosis of collagenous colitis made at the time of colonoscopy in April 2016 at which time she was evaluated for persistent diarrhea.  Biopsies were positive for collagenous colitis. Also with history of depression, and has IUD in place. Patient says that she remembers taking Entocort for short period of time after initial diagnosis but says she did not continue the medication because she did not feel that it eliminated the diarrhea though it did improve it and she did not like the side effects which included weight gain and bloating.  She says she generally felt bad while she was on the medication and decided she would rather have diarrhea. She has had persistent problems with diarrhea since somewhat worse over the past 2 years.  In a typical day she will usually have at least 5-6 liquid watery bowel movements.  Has on occasion seen a small amount of blood but that is unusual. Now since November 2020 she has developed pelvic pain which she describes as crampy in nature.  She was actually seen in the ER/urgent care on 10/19/2019 and diagnosed with cervicitis and given a course of metronidazole though her swabs were all negative.  She had pelvic ultrasound which was negative. She has recently seen her gynecologist Dr. Ilda Basset who did not feel that she had PID and after reviewing recent CT felt that she needed GI evaluation. She did have CT scan of the abdomen and pelvis done at the time of ER visit on 10/19/2019.  This showed bilateral nephrolithiasis, a decompressed bladder, there was mild diffuse wall thickening throughout the colon and a large amount of poorly formed stool throughout the colon, no adenopathy, no ascites She was given a course of Cipro and Flagyl at that time  which she has completed. CBC was unremarkable, be met with potassium of 3.1.  She is also been seen by her PCP since then and has been given a course of Carafate which she thinks is helping because she has also been experiencing nausea.  She complains of a bloated swollen feeling in her mid and lower abdomen.  Today she had multiple episodes of watery diarrhea though says the diarrhea has slowed a bit since she has been on the Carafate.  Review of Systems Pertinent positive and negative review of systems were noted in the above HPI section.  All other review of systems was otherwise negative.  Outpatient Encounter Medications as of 11/17/2019  Medication Sig  . cetirizine (ZYRTEC) 10 MG tablet Take 10 mg by mouth daily.  . DULoxetine (CYMBALTA) 30 MG capsule Take 30 mg by mouth daily.  . fluticasone (FLONASE) 50 MCG/ACT nasal spray Place 2 sprays into both nostrils daily.  Marland Kitchen lamoTRIgine (LAMICTAL) 25 MG tablet Take 25 mg by mouth every evening.   Marland Kitchen levonorgestrel (MIRENA, 52 MG,) 20 MCG/24HR IUD 1 each by Intrauterine route once.  Marland Kitchen levothyroxine (SYNTHROID, LEVOTHROID) 75 MCG tablet Take 75 mcg by mouth daily before breakfast.   . ondansetron (ZOFRAN ODT) 4 MG disintegrating tablet Take 1 tablet (4 mg total) by mouth every 8 (eight) hours as needed for nausea or vomiting.  Marland Kitchen oxyCODONE-acetaminophen (PERCOCET/ROXICET) 5-325 MG tablet Take 2 tablets by mouth every 4 (four) hours as needed for severe pain.  Marland Kitchen  dicyclomine (BENTYL) 10 MG capsule Take 1 capsule (10 mg total) by mouth every 6 (six) hours as needed for spasms (as needed for cramping and diarrhea).  . [DISCONTINUED] ciprofloxacin (CIPRO) 500 MG tablet Take 1 tablet (500 mg total) by mouth 2 (two) times daily.  . [DISCONTINUED] diazepam (VALIUM) 2 MG tablet Take 1 tablet (2 mg total) by mouth every 12 (twelve) hours as needed (Vertigo). (Patient not taking: Reported on 10/23/2019)  . [DISCONTINUED] metroNIDAZOLE (FLAGYL) 500 MG tablet Take  1 tablet (500 mg total) by mouth 2 (two) times daily.   No facility-administered encounter medications on file as of 11/17/2019.   Allergies  Allergen Reactions  . Doxycycline Nausea And Vomiting  . Morphine And Related Nausea And Vomiting  . Sulfur Hives    Swell, rash, burns skin  . Vicodin [Hydrocodone-Acetaminophen] Nausea And Vomiting   Patient Active Problem List   Diagnosis Date Noted  . Bilateral nephrolithiasis 10/23/2019  . IUD (intrauterine device) in place 10/23/2019  . Collagenous colitis 02/25/2015   Social History   Socioeconomic History  . Marital status: Married    Spouse name: Not on file  . Number of children: 1  . Years of education: Not on file  . Highest education level: Not on file  Occupational History  . Not on file  Tobacco Use  . Smoking status: Never Smoker  . Smokeless tobacco: Never Used  Substance and Sexual Activity  . Alcohol use: Yes    Alcohol/week: 0.0 standard drinks    Comment: rare  . Drug use: No  . Sexual activity: Yes    Partners: Male    Birth control/protection: OCP, I.U.D.  Other Topics Concern  . 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 Difficulty of Paying Living Expenses: Not on file  Food Insecurity:   . Worried About Charity fundraiser in the Last Year: Not on file  . Ran Out of Food in the Last Year: Not on file  Transportation Needs:   . Lack of Transportation (Medical): Not on file  . Lack of Transportation (Non-Medical): Not on file  Physical Activity:   . Days of Exercise per Week: Not on file  . Minutes of Exercise per Session: Not on file  Stress:   . Feeling of Stress : Not on file  Social Connections:   . Frequency of Communication with Friends and Family: Not on file  . Frequency of Social Gatherings with Friends and Family: Not on file   . Attends Religious Services: Not on file  . Active Member of Clubs or Organizations: Not on file  . Attends Archivist Meetings: Not on file  . Marital Status: Not on file  Intimate Partner Violence:   . Fear of Current or Ex-Partner: Not on file  . Emotionally Abused: Not on file  . Physically Abused: Not on file  . Sexually Abused: Not on file    Ms. Hoerner family history includes Alcohol abuse in her father; Breast cancer in her paternal aunt; Cirrhosis in her father; Hypertension in her father and mother; Liver cancer in her father; Uterine cancer in her sister.      Objective:    Vitals:   11/17/19 1530  BP: 98/68  Pulse: 84  Temp: 98.1 F (36.7 C)  Physical Exam Well-developed well-nourished white female in no acute distress.  Pleasant  [phone], BMI 33.9  HEENT; nontraumatic normocephalic, EOMI, PER R LA, sclera anicteric. Oropharynx; not examined/mask/Covid Neck; supple, no JVD Cardiovascular; regular rate and rhythm with S1-S2, no murmur rub or gallop Pulmonary; Clear bilaterally Abdomen; soft, there is tenderness in the left lower and left mid quadrant,, nondistended, no palpable mass or hepatosplenomegaly, bowel sounds are active Rectal; not done today Skin; benign exam, no jaundice rash or appreciable lesions Extremities; no clubbing cyanosis or edema skin warm and dry Neuro/Psych; alert and oriented x4, grossly nonfocal mood and affect appropriate       Assessment & Plan:   #59 39 year old white female with previous diagnosis of collagenous colitis for which she has not been on any treatment over the past 4-1/2 years, who presents now with history of persistent diarrhea over the past couple of years and onset over the past 2 months with lower abdominal/pelvic pain cramping, and increase in diarrhea , and new associated nausea bloating and fullness  Recent CT showed diffuse mild  wall thickening throughout the colon.  Empiric course of Cipro  and Flagyl with no significant change in symptoms, and recent GYN evaluation with IUD felt to be in position and symptoms not felt secondary to PID or cervicitis.  Rule out new development of IBD, versus exacerbation of collagenous colitis which would not explain wall thickening seen on CT.  Plan; check sed rate and CRP Patient will be scheduled for colonoscopy with Dr. Carlean Purl.  Procedure discussed in detail with patient including indications risks and benefits and she is agreeable to proceed. Asked her to add trial of OTC Pepcid 20 mg p.o. twice daily She may continue Carafate 1 g 3 times daily if helpful. Add trial of Bentyl 10 mg every 6 hours as needed for abdominal pain and cramping. Further recommendations pending findings of above.  Alison Clarke Genia Harold PA-C 11/17/2019   Cc: Remi Haggard, FNP

## 2019-11-18 NOTE — Progress Notes (Signed)
Please let pt know her labs are normal, inflammatory markers normal

## 2019-11-20 ENCOUNTER — Ambulatory Visit (INDEPENDENT_AMBULATORY_CARE_PROVIDER_SITE_OTHER): Payer: PRIVATE HEALTH INSURANCE

## 2019-11-20 DIAGNOSIS — Z1159 Encounter for screening for other viral diseases: Secondary | ICD-10-CM

## 2019-11-24 ENCOUNTER — Encounter: Payer: PRIVATE HEALTH INSURANCE | Admitting: Internal Medicine

## 2019-11-24 ENCOUNTER — Telehealth: Payer: Self-pay | Admitting: Internal Medicine

## 2019-11-24 NOTE — Telephone Encounter (Signed)
Hi Dr. Leone Payor, we received a message from Alison Clarke [facility]) advising that patient cancelled her procedure that was scheduled with you today due to issues with her insurance. Thank you.

## 2019-11-24 NOTE — Telephone Encounter (Signed)
Ok No charge

## 2019-12-01 ENCOUNTER — Telehealth: Payer: Self-pay | Admitting: Internal Medicine

## 2019-12-01 ENCOUNTER — Telehealth: Payer: Self-pay

## 2019-12-01 NOTE — Telephone Encounter (Signed)
Of course - I understand

## 2019-12-01 NOTE — Telephone Encounter (Signed)
Patient calls reporting her diarrhea has become much worse. She felt she was doing "okay."  Over the weekend she suddenly became much worse. She has had nausea without vomiting. Diarrhea that is described as "yellow with only sediment and water." She feels she is dehydrated. Notes weight loss and weakness. "Everything goes straight through me." Please advise.

## 2019-12-01 NOTE — Telephone Encounter (Signed)
Dr Leone Payor The patient wanted to know if she would be allowed to change to a female GI provider. She states she would be more comfortable. She does not have any complaints or concerns about her care.

## 2019-12-01 NOTE — Telephone Encounter (Signed)
Dr Lavon Paganini Would you be willing to take Ms.Scardina as your patient? She feels she will be more comfortable with a female provider. She does not have any concerns about her care. She is also very happy with Mike Gip, PA. Thanks

## 2019-12-01 NOTE — Telephone Encounter (Signed)
Please forward to correct nurse. Thank you.

## 2019-12-02 ENCOUNTER — Other Ambulatory Visit: Payer: Self-pay

## 2019-12-02 ENCOUNTER — Other Ambulatory Visit (INDEPENDENT_AMBULATORY_CARE_PROVIDER_SITE_OTHER): Payer: Commercial Managed Care - PPO

## 2019-12-02 DIAGNOSIS — R197 Diarrhea, unspecified: Secondary | ICD-10-CM

## 2019-12-02 DIAGNOSIS — K52831 Collagenous colitis: Secondary | ICD-10-CM

## 2019-12-02 LAB — BASIC METABOLIC PANEL
BUN: 12 mg/dL (ref 6–23)
CO2: 24 mEq/L (ref 19–32)
Calcium: 9.5 mg/dL (ref 8.4–10.5)
Chloride: 103 mEq/L (ref 96–112)
Creatinine, Ser: 1.02 mg/dL (ref 0.40–1.20)
GFR: 59.75 mL/min — ABNORMAL LOW (ref >60.00)
Glucose, Bld: 132 mg/dL — ABNORMAL HIGH (ref 70–99)
Potassium: 3.1 mEq/L — ABNORMAL LOW (ref 3.5–5.1)
Sodium: 137 mEq/L (ref 135–145)

## 2019-12-02 LAB — CBC WITH DIFFERENTIAL/PLATELET
Basophils Absolute: 0.1 10*3/uL (ref 0.0–0.1)
Basophils Relative: 1.1 % (ref 0.0–3.0)
Eosinophils Absolute: 0.5 10*3/uL (ref 0.0–0.7)
Eosinophils Relative: 7.4 % — ABNORMAL HIGH (ref 0.0–5.0)
HCT: 43.8 % (ref 36.0–46.0)
Hemoglobin: 14.8 g/dL (ref 12.0–15.0)
Lymphocytes Relative: 21.2 % (ref 12.0–46.0)
Lymphs Abs: 1.4 10*3/uL (ref 0.7–4.0)
MCHC: 33.7 g/dL (ref 30.0–36.0)
MCV: 91.3 fl (ref 78.0–100.0)
Monocytes Absolute: 0.5 10*3/uL (ref 0.1–1.0)
Monocytes Relative: 7.5 % (ref 3.0–12.0)
Neutro Abs: 4.3 10*3/uL (ref 1.4–7.7)
Neutrophils Relative %: 62.8 % (ref 43.0–77.0)
Platelets: 324 10*3/uL (ref 150.0–400.0)
RBC: 4.8 Mil/uL (ref 3.87–5.11)
RDW: 13.2 % (ref 11.5–15.5)
WBC: 6.8 10*3/uL (ref 4.0–10.5)

## 2019-12-02 LAB — SEDIMENTATION RATE: Sed Rate: 9 mm/hr (ref 0–20)

## 2019-12-02 MED ORDER — BUDESONIDE 3 MG PO CPEP: 9.0000 mg | ORAL_CAPSULE | Freq: Every day | ORAL | 0 refills | Status: DC

## 2019-12-02 NOTE — Telephone Encounter (Signed)
Ok , Please schedule next available appointment. Thanks

## 2019-12-02 NOTE — Telephone Encounter (Signed)
Spoke with the patient. She agrees to this plan of care. She has Zofran on hand and will use this. She agrees to start Imodium as recommended. She does express concerns about Entocort. States she is sure it caused her to be bloated and she does not want to "be on it forever." Appointment scheduled with her new primary GI, Dr Lavon Paganini, 12/03/19.  She does not want to wait until February. She will come in for labs today.

## 2019-12-02 NOTE — Telephone Encounter (Signed)
Ok sounds good, thanks! 

## 2019-12-02 NOTE — Telephone Encounter (Signed)
Ok, thanks.

## 2019-12-02 NOTE — Telephone Encounter (Signed)
Please call her and ask her to get Imodium - may take up to 6 per day - start with 2 po qam, please call in Rx for Zofran 4 mg q 6 hours prn for nausea #40/1 refill I would also like to go ahead and get her started on Entocort 9 mg p.o. daily, or generic budesonide 9 mg p.o. daily.  She canceled her colonoscopy, this needs to get rescheduled, I believe it was with Dr. Carlean Purl.  Would like her to come and get labs done to include CBC with differential, be met and sed rate- thanks

## 2019-12-03 ENCOUNTER — Encounter: Payer: Self-pay | Admitting: Gastroenterology

## 2019-12-03 ENCOUNTER — Other Ambulatory Visit: Payer: Commercial Managed Care - PPO

## 2019-12-03 ENCOUNTER — Other Ambulatory Visit: Payer: Self-pay

## 2019-12-03 ENCOUNTER — Ambulatory Visit: Payer: Commercial Managed Care - PPO | Admitting: Gastroenterology

## 2019-12-03 ENCOUNTER — Encounter (HOSPITAL_BASED_OUTPATIENT_CLINIC_OR_DEPARTMENT_OTHER): Payer: Self-pay | Admitting: *Deleted

## 2019-12-03 ENCOUNTER — Emergency Department (HOSPITAL_BASED_OUTPATIENT_CLINIC_OR_DEPARTMENT_OTHER)
Admission: EM | Admit: 2019-12-03 | Discharge: 2019-12-03 | Disposition: A | Discharge status: Home / Self Care | Payer: Commercial Managed Care - PPO | Attending: Emergency Medicine | Admitting: Emergency Medicine

## 2019-12-03 ENCOUNTER — Ambulatory Visit: Payer: PRIVATE HEALTH INSURANCE | Admitting: Gastroenterology

## 2019-12-03 VITALS — Temp 97.6°F | Ht 61.0 in | Wt 166.0 lb

## 2019-12-03 DIAGNOSIS — R252 Cramp and spasm: Secondary | ICD-10-CM | POA: Insufficient documentation

## 2019-12-03 DIAGNOSIS — R197 Diarrhea, unspecified: Secondary | ICD-10-CM

## 2019-12-03 DIAGNOSIS — Z79899 Other long term (current) drug therapy: Secondary | ICD-10-CM | POA: Diagnosis not present

## 2019-12-03 DIAGNOSIS — K52831 Collagenous colitis: Secondary | ICD-10-CM

## 2019-12-03 DIAGNOSIS — Z20822 Contact with and (suspected) exposure to covid-19: Secondary | ICD-10-CM | POA: Diagnosis not present

## 2019-12-03 DIAGNOSIS — R112 Nausea with vomiting, unspecified: Secondary | ICD-10-CM | POA: Diagnosis not present

## 2019-12-03 DIAGNOSIS — J45909 Unspecified asthma, uncomplicated: Secondary | ICD-10-CM | POA: Diagnosis not present

## 2019-12-03 DIAGNOSIS — E876 Hypokalemia: Secondary | ICD-10-CM | POA: Diagnosis not present

## 2019-12-03 DIAGNOSIS — R5383 Other fatigue: Secondary | ICD-10-CM | POA: Insufficient documentation

## 2019-12-03 DIAGNOSIS — R6883 Chills (without fever): Secondary | ICD-10-CM | POA: Diagnosis not present

## 2019-12-03 DIAGNOSIS — R42 Dizziness and giddiness: Secondary | ICD-10-CM | POA: Insufficient documentation

## 2019-12-03 DIAGNOSIS — R634 Abnormal weight loss: Secondary | ICD-10-CM

## 2019-12-03 DIAGNOSIS — E039 Hypothyroidism, unspecified: Secondary | ICD-10-CM | POA: Insufficient documentation

## 2019-12-03 DIAGNOSIS — R110 Nausea: Secondary | ICD-10-CM | POA: Diagnosis present

## 2019-12-03 DIAGNOSIS — R1084 Generalized abdominal pain: Secondary | ICD-10-CM | POA: Insufficient documentation

## 2019-12-03 LAB — URINALYSIS, ROUTINE W REFLEX MICROSCOPIC
Glucose, UA: NEGATIVE mg/dL
Ketones, ur: 15 mg/dL — AB
Leukocytes,Ua: NEGATIVE
Nitrite: NEGATIVE
Protein, ur: NEGATIVE mg/dL
Specific Gravity, Urine: >1.03 — ABNORMAL HIGH (ref 1.005–1.030)
pH: 6 (ref 5.0–8.0)

## 2019-12-03 LAB — CBC WITH DIFFERENTIAL/PLATELET
Abs Immature Granulocytes: 0.04 10*3/uL (ref 0.00–0.07)
Basophils Absolute: 0.1 10*3/uL (ref 0.0–0.1)
Basophils Relative: 0 %
Eosinophils Absolute: 0.1 10*3/uL (ref 0.0–0.5)
Eosinophils Relative: 1 %
HCT: 44.8 % (ref 36.0–46.0)
Hemoglobin: 15.5 g/dL — ABNORMAL HIGH (ref 12.0–15.0)
Immature Granulocytes: 0 %
Lymphocytes Relative: 10 %
Lymphs Abs: 1.3 10*3/uL (ref 0.7–4.0)
MCH: 30.7 pg (ref 26.0–34.0)
MCHC: 34.6 g/dL (ref 30.0–36.0)
MCV: 88.7 fL (ref 80.0–100.0)
Monocytes Absolute: 0.9 10*3/uL (ref 0.1–1.0)
Monocytes Relative: 7 %
Neutro Abs: 10.3 10*3/uL — ABNORMAL HIGH (ref 1.7–7.7)
Neutrophils Relative %: 82 %
Platelets: 342 10*3/uL (ref 150–400)
RBC: 5.05 MIL/uL (ref 3.87–5.11)
RDW: 12 % (ref 11.5–15.5)
WBC: 12.8 10*3/uL — ABNORMAL HIGH (ref 4.0–10.5)
nRBC: 0 % (ref 0.0–0.2)

## 2019-12-03 LAB — COMPREHENSIVE METABOLIC PANEL
ALT: 18 U/L (ref 0–44)
AST: 19 U/L (ref 15–41)
Albumin: 5.2 g/dL — ABNORMAL HIGH (ref 3.5–5.0)
Alkaline Phosphatase: 66 U/L (ref 38–126)
Anion gap: 12 (ref 5–15)
BUN: 14 mg/dL (ref 6–20)
CO2: 21 mmol/L — ABNORMAL LOW (ref 22–32)
Calcium: 9.6 mg/dL (ref 8.9–10.3)
Chloride: 103 mmol/L (ref 98–111)
Creatinine, Ser: 0.99 mg/dL (ref 0.44–1.00)
GFR calc Af Amer: >60 mL/min (ref >60)
GFR calc non Af Amer: >60 mL/min (ref >60)
Glucose, Bld: 106 mg/dL — ABNORMAL HIGH (ref 70–99)
Potassium: 2.8 mmol/L — ABNORMAL LOW (ref 3.5–5.1)
Sodium: 136 mmol/L (ref 135–145)
Total Bilirubin: 1.1 mg/dL (ref 0.3–1.2)
Total Protein: 8.8 g/dL — ABNORMAL HIGH (ref 6.5–8.1)

## 2019-12-03 LAB — SARS CORONAVIRUS 2 BY RT PCR (HOSPITAL ORDER, PERFORMED IN ~~LOC~~ HOSPITAL LAB): SARS Coronavirus 2: NEGATIVE

## 2019-12-03 LAB — URINALYSIS, MICROSCOPIC (REFLEX)

## 2019-12-03 LAB — PREGNANCY, URINE: Preg Test, Ur: NEGATIVE

## 2019-12-03 LAB — LIPASE, BLOOD: Lipase: 29 U/L (ref 11–51)

## 2019-12-03 MED ORDER — POTASSIUM CHLORIDE CRYS ER 20 MEQ PO TBCR: 40.0000 meq | EXTENDED_RELEASE_TABLET | Freq: Every day | ORAL | 0 refills | Status: DC

## 2019-12-03 MED ORDER — POTASSIUM CHLORIDE CRYS ER 20 MEQ PO TBCR: 20.0000 meq | EXTENDED_RELEASE_TABLET | Freq: Two times a day (BID) | ORAL | 0 refills | Status: DC

## 2019-12-03 MED ORDER — SODIUM CHLORIDE 0.9 % IV BOLUS
1000.0000 mL | Freq: Once | INTRAVENOUS | Status: AC
  Administered 2019-12-03: 1000 mL via INTRAVENOUS

## 2019-12-03 MED ORDER — PROMETHAZINE HCL 25 MG PO TABS: 25.0000 mg | ORAL_TABLET | Freq: Four times a day (QID) | ORAL | 0 refills | Status: DC | PRN

## 2019-12-03 MED ORDER — PROMETHAZINE HCL 25 MG/ML IJ SOLN
12.5000 mg | Freq: Once | INTRAMUSCULAR | Status: AC
  Administered 2019-12-03: 12.5 mg via INTRAVENOUS

## 2019-12-03 MED ORDER — PROCHLORPERAZINE EDISYLATE 10 MG/2ML IJ SOLN
10.0000 mg | Freq: Once | INTRAMUSCULAR | Status: AC
  Administered 2019-12-03: 10 mg via INTRAVENOUS
  Filled 2019-12-03: qty 2

## 2019-12-03 MED ORDER — ONDANSETRON 4 MG PO TBDP: 4.0000 mg | ORAL_TABLET | Freq: Three times a day (TID) | ORAL | 0 refills | Status: DC | PRN

## 2019-12-03 MED ORDER — PROMETHAZINE HCL 25 MG PO TABS: 25.0000 mg | ORAL_TABLET | Freq: Two times a day (BID) | ORAL | 0 refills | Status: DC

## 2019-12-03 MED ORDER — POTASSIUM CHLORIDE 10 MEQ/100ML IV SOLN
10.0000 meq | INTRAVENOUS | Status: AC
  Administered 2019-12-03 (×2): 10 meq via INTRAVENOUS
  Filled 2019-12-03 (×2): qty 100

## 2019-12-03 MED ORDER — PROMETHAZINE HCL 25 MG/ML IJ SOLN
12.5000 mg | Freq: Four times a day (QID) | INTRAMUSCULAR | Status: DC | PRN
  Administered 2019-12-03: 12.5 mg via INTRAVENOUS
  Filled 2019-12-03 (×2): qty 1

## 2019-12-03 NOTE — Telephone Encounter (Signed)
Pt has appt today 12/03/19 @ 2pm.  States she is feeling worse today and very weak.  Would like to speak to a nurse before her appt.

## 2019-12-03 NOTE — Discharge Instructions (Signed)
Please continue Zofran and alternate with Phenergan as needed for nausea Take potassium supplements daily Continue to drink fluids as tolerated Take Imodium as directed Follow up with your GI doctor

## 2019-12-03 NOTE — Progress Notes (Addendum)
Alison Clarke    [id number]    17-Jun-1981  Primary Care Physician:Lindley, Jordan Likes, FNP  Referring Physician: Remi Haggard, [address] [place],  [place] [address]   Chief complaint:  Diarrhea  HPI:  39 yr F with h/o collagenous colitis here for follow up visit with c/o diarrhea and severe nausea  She is having chills and woke up covered in sweat. Denies any fever, cough or shortness of breath.  She has been feeling progressively worse since Saturday.   She is unable to keep even liquids down, has taken Zofran this afternoon X2 but still continues to have severe nausea.  She is constantly dry heaving, she vomited twice in the lobby while she was here for this appointment. She lost  15 lbs in past 1 week.   Denies any sick contacts or exposure to COVID-19  She was having worsening diarrhea and lower abdominal discomfort since November but is having progressive worsening of symptoms with nausea and vomiting since past Saturday.  CT abd & pelvis with contrast October 19, 2019: Diffuse mild colon wall thickening.  Bilateral nephrolithiasis nonobstructive  She completed course of antibiotics X2, most recently Cipro and Flagyl in December.   Colonoscopy April 2016: Biopsies positive for collagenous colitis, she was treated with entocort at that time.  Patient says she felt bloated and didn't like being on Entocort  She saw [facility] November 17, 2019.  She was given prescription for Bentyl and also Entocort which she has not started.  She feels it made her symptoms worse in the past and she is normal start taking them  Reviewed recent labs November 17, 2019 in December 02, 2019, significant for low potassium 3.1.  Normal BUN and creatinine.  Normal CBC.  ESR normal at 9.   Outpatient Encounter Medications as of 12/03/2019  Medication Sig  . budesonide (ENTOCORT EC) 3 MG 24 hr capsule Take 3 capsules (9 mg total) by mouth daily.  . cetirizine (ZYRTEC) 10  MG tablet Take 10 mg by mouth daily.  Marland Kitchen dicyclomine (BENTYL) 10 MG capsule Take 1 capsule (10 mg total) by mouth every 6 (six) hours as needed for spasms (as needed for cramping and diarrhea).  . DULoxetine (CYMBALTA) 30 MG capsule Take 30 mg by mouth daily.  . fluticasone (FLONASE) 50 MCG/ACT nasal spray Place 2 sprays into both nostrils daily.  Marland Kitchen lamoTRIgine (LAMICTAL) 25 MG tablet Take 25 mg by mouth every evening.   Marland Kitchen levonorgestrel (MIRENA, 52 MG,) 20 MCG/24HR IUD 1 each by Intrauterine route once.  Marland Kitchen levothyroxine (SYNTHROID, LEVOTHROID) 75 MCG tablet Take 75 mcg by mouth daily before breakfast.   . ondansetron (ZOFRAN ODT) 4 MG disintegrating tablet Take 1 tablet (4 mg total) by mouth every 8 (eight) hours as needed for nausea or vomiting.  Marland Kitchen oxyCODONE-acetaminophen (PERCOCET/ROXICET) 5-325 MG tablet Take 2 tablets by mouth every 4 (four) hours as needed for severe pain.  . potassium chloride SA (KLOR-CON) 20 MEQ tablet Take 1 tablet (20 mEq total) by mouth 2 (two) times daily for 5 days.   No facility-administered encounter medications on file as of 12/03/2019.    Allergies as of 12/03/2019 - Review Complete 11/17/2019  Allergen Reaction Noted  . Doxycycline Nausea And Vomiting 07/19/2018  . Morphine and related Nausea And Vomiting 02/22/2015  . Sulfur Hives 12/07/2014  . Vicodin [hydrocodone-acetaminophen] Nausea And Vomiting 02/22/2015    Past Medical History:  Diagnosis Date  .  Allergic rhinitis   . Anxiety   . Asthma   . Colitis   . Collagenous colitis 02/25/2015   02/2015 colonoscopy  . Depression   . Heart murmur    past hx of but not now   . Hypothyroidism   . IBS (irritable bowel syndrome)   . Kidney stones   . Meningitis    age 39 -  . Scarlet fever   . Shingles     Past Surgical History:  Procedure Laterality Date  . COLONOSCOPY    . TONSILLECTOMY    . WISDOM TOOTH EXTRACTION      Family History  Problem Relation Age of Onset  . Hypertension Mother    . Hypertension Father   . Liver cancer Father   . Alcohol abuse Father   . Cirrhosis Father   . Breast cancer Paternal Aunt   . Uterine cancer Sister   . Colon cancer Neg Hx   . Esophageal cancer Neg Hx   . Rectal cancer Neg Hx     Social History   Socioeconomic History  . Marital status: Married    Spouse name: Not on file  . Number of children: 1  . Years of education: Not on file  . Highest education level: Not on file  Occupational History  . Not on file  Tobacco Use  . Smoking status: Never Smoker  . Smokeless tobacco: Never Used  Substance and Sexual Activity  . Alcohol use: Yes    Alcohol/week: 0.0 standard drinks    Comment: rare  . Drug use: No  . Sexual activity: Yes    Partners: Male    Birth control/protection: OCP, I.U.D.  Other Topics Concern  . 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 Difficulty of Paying Living Expenses: Not on file  Food Insecurity:   . Worried About Charity fundraiser in the Last Year: Not on file  . Ran Out of Food in the Last Year: Not on file  Transportation Needs:   . Lack of Transportation (Medical): Not on file  . Lack of Transportation (Non-Medical): Not on file  Physical Activity:   . Days of Exercise per Week: Not on file  . Minutes of Exercise per Session: Not on file  Stress:   . Feeling of Stress : Not on file  Social Connections:   . Frequency of Communication with Friends and Family: Not on file  . Frequency of Social Gatherings with Friends and Family: Not on file  . Attends Religious Services: Not on file  . Active Member of Clubs or Organizations: Not on file  . Attends Archivist Meetings: Not on file  . Marital Status: Not on file  Intimate Partner Violence:   . Fear of Current or Ex-Partner: Not on file  . Emotionally Abused:  Not on file  . Physically Abused: Not on file  . Sexually Abused: Not on file      Review of systems: Review of Systems  Constitutional: Negative for fever and positive for fatigue and chills.  HENT: Negative.   Eyes: Negative for blurred vision.  Respiratory: Negative for cough, shortness of breath and wheezing.   Cardiovascular: Negative for chest pain and palpitations.  Gastrointestinal: as per HPI Genitourinary: Negative for dysuria, urgency, frequency and hematuria.  Musculoskeletal: Negative for myalgias, back pain and joint pain.  Skin: Negative for itching and rash.  Neurological: Negative for tremors, focal weakness, seizures and loss of consciousness.  Positive for dizziness Endo/Heme/Allergies: Negative Psychiatric/Behavioral: Negative for depression, suicidal ideas and hallucinations.  All other systems reviewed and are negative.   Physical Exam: Vitals:   12/03/19 1352  Temp: 97.6 F (36.4 C)   Body mass index is 31.37 kg/m. Gen:      No acute distress HEENT:  EOMI, sclera anicteric Neck:     No masses; no thyromegaly Lungs:    Clear to auscultation bilaterally; normal respiratory effort CV:         Regular rate and rhythm; no murmurs Abd:      + bowel sounds; soft, non-tender; no palpable masses, no distension Ext:    No edema; adequate peripheral perfusion Skin:      Warm and dry; no rash Neuro: alert and oriented x 3 Psych: normal mood and affect  Data Reviewed:  Reviewed labs, radiology imaging, old records and pertinent past GI work up   Assessment and Plan/Recommendations:  39 year old female with history of collagenous colitis with complaints of severe nausea, vomiting and diarrhea.  Patient is unable to even tolerate clear liquids She is also having chills and night sweats.  She has lost 15 pounds in the past week. Will need to exclude COVID-19 infection as can sometimes present with isolated GI symptoms Advised patient to drive to [place] for stat COVID-19 testing  Will send prescription for Phenergan 25 mg twice daily as needed.  No improvement of nausea with Zofran Advised patient to continue clear liquids sips as tolerated If she is still unable to tolerate any liquids, advised her to come to ER for IV hydration and management of severe nausea  If COVID-19 positive, patient will need to isolate for at least 10 days, she will need to come to ER if symptoms worse. If COVID-19 negative, will check stool C. difficile and also consider EGD and colonoscopy for further evaluation  This visit required 30 minutes of patient care (this includes precharting, chart review, review of results, face-to-face time used for counseling as well as treatment plan and follow-up. The patient was provided an opportunity to ask questions and all were answered. The patient agreed with the plan and demonstrated an understanding of the instructions.  Damaris Hippo , MD    CC: Remi Haggard, FNP

## 2019-12-03 NOTE — ED Provider Notes (Signed)
[facility] EMERGENCY DEPARTMENT Provider Note   CSN: [id number] Arrival date & time: 12/03/19  1535     History Chief Complaint  Patient presents with  . Emesis    Alison Clarke is a 39 y.o. female who presents with nausea and diarrhea.  Patient states that she started to have diarrhea on Saturday.  She describes it as severe and watery.  She states that she has not been able eat or drink that much and it runs right through her when she tries.  She has a history of collagenous colitis and is followed by GI.  She saw her GI doctor today and they recommended for her to get a Covid test done and blood work.  Patient states that she went to go get blood work done but felt so bad that she decided she would come to the ED tonight because she felt so dehydrated and was having fatigue and muscle cramping.  She states that the nausea is her worst symptom. She feels her sense of smell is heightened and it will make her nauseous.  She takes Zofran at home which previously has been helping her however now it only lasts about an hour and a half until it returns. She took a dose just PTA.  Her GI doctor called in Phenergan but she has not picked that up yet.  She denies fever, chills, cough, shortness of breath, vomiting, urinary symptoms.  She has been on antibiotics recently for colitis.  She reports generalized abdominal pain which started today.  HPI     Past Medical History:  Diagnosis Date  . Allergic rhinitis   . Anxiety   . Asthma   . Colitis   . Collagenous colitis 02/25/2015   02/2015 colonoscopy  . Depression   . Heart murmur    past hx of but not now   . Hypothyroidism   . IBS (irritable bowel syndrome)   . Kidney stones   . Meningitis    age 33 -  . Scarlet fever   . Shingles     Patient Active Problem List   Diagnosis Date Noted  . Bilateral nephrolithiasis 10/23/2019  . IUD (intrauterine device) in place 10/23/2019  . Collagenous colitis 02/25/2015    Past  Surgical History:  Procedure Laterality Date  . COLONOSCOPY    . TONSILLECTOMY    . WISDOM TOOTH EXTRACTION       OB History    Gravida  3   Para  1   Term  1   Preterm      AB  2   Living  1     SAB  1   TAB  1   Ectopic      Multiple      Live Births              Family History  Problem Relation Age of Onset  . Hypertension Mother   . Hypertension Father   . Liver cancer Father   . Alcohol abuse Father   . Cirrhosis Father   . Breast cancer Paternal Aunt   . Uterine cancer Sister   . Colon cancer Neg Hx   . Esophageal cancer Neg Hx   . Rectal cancer Neg Hx     Social History   Tobacco Use  . Smoking status: Never Smoker  . Smokeless tobacco: Never Used  Substance Use Topics  . Alcohol use: Yes    Alcohol/week: 0.0 standard drinks  Comment: rare  . Drug use: No    Home Medications Prior to Admission medications   Medication Sig Start Date End Date Taking? Authorizing Provider  budesonide (ENTOCORT EC) 3 MG 24 hr capsule Take 3 capsules (9 mg total) by mouth daily. 12/02/19   Esterwood, Amy S, PA-C  cetirizine (ZYRTEC) 10 MG tablet Take 10 mg by mouth daily.    [provider]  dicyclomine (BENTYL) 10 MG capsule Take 1 capsule (10 mg total) by mouth every 6 (six) hours as needed for spasms (as needed for cramping and diarrhea). 11/17/19   Esterwood, Amy S, PA-C  DULoxetine (CYMBALTA) 30 MG capsule Take 30 mg by mouth daily.    [provider]  fluticasone (FLONASE) 50 MCG/ACT nasal spray Place 2 sprays into both nostrils daily.    [provider]  lamoTRIgine (LAMICTAL) 25 MG tablet Take 25 mg by mouth every evening.  12/02/14   [provider]  levonorgestrel (MIRENA, 52 MG,) 20 MCG/24HR IUD 1 each by Intrauterine route once.    [provider]  levothyroxine (SYNTHROID, LEVOTHROID) 75 MCG tablet Take 75 mcg by mouth daily before breakfast.  04/04/15   [provider]  ondansetron (ZOFRAN ODT)  4 MG disintegrating tablet Take 1 tablet (4 mg total) by mouth every 8 (eight) hours as needed for nausea or vomiting. 10/23/19   [place] Bing, MD  potassium chloride SA (KLOR-CON) 20 MEQ tablet Take 1 tablet (20 mEq total) by mouth 2 (two) times daily for 5 days. 12/03/19 12/08/19  Napoleon Form, MD  promethazine (PHENERGAN) 25 MG tablet Take 1 tablet (25 mg total) by mouth 2 (two) times daily. 12/03/19   Napoleon Form, MD    Allergies    Doxycycline, Morphine and related, Sulfur, and Vicodin [hydrocodone-acetaminophen]  Review of Systems   Review of Systems  Constitutional: Negative for chills and fever.  Respiratory: Negative for cough and shortness of breath.   Cardiovascular: Negative for chest pain.  Gastrointestinal: Positive for abdominal pain, diarrhea and nausea. Negative for vomiting.  Genitourinary: Negative for dysuria and flank pain.  Neurological: Positive for light-headedness.  All other systems reviewed and are negative.   Physical Exam Updated Vital Signs BP (!) 121/106   Pulse 100   Temp 98.6 F (37 C)   Resp 18   Ht 5\' 1"  (1.549 m)   Wt 75 kg   SpO2 100%   BMI 31.24 kg/m   Physical Exam Vitals and nursing note reviewed.  Constitutional:      General: She is not in acute distress.    Appearance: Normal appearance. She is well-developed. She is not ill-appearing.     Comments: Uncomfortable appearing.  No acute distress  HENT:     Head: Normocephalic and atraumatic.  Eyes:     General: No scleral icterus.       Right eye: No discharge.        Left eye: No discharge.     Conjunctiva/sclera: Conjunctivae normal.     Pupils: Pupils are equal, round, and reactive to light.  Cardiovascular:     Rate and Rhythm: Normal rate and regular rhythm.  Pulmonary:     Effort: Pulmonary effort is normal. No respiratory distress.     Breath sounds: Normal breath sounds.  Abdominal:     General: There is no distension.     Palpations: Abdomen is soft.      Tenderness: There is no abdominal tenderness.  Musculoskeletal:  Cervical back: Normal range of motion.  Skin:    General: Skin is warm and dry.  Neurological:     Mental Status: She is alert and oriented to person, place, and time.  Psychiatric:        Behavior: Behavior normal.     ED Results / Procedures / Treatments   Labs (all labs ordered are listed, but only abnormal results are displayed) Labs Reviewed  CBC WITH DIFFERENTIAL/PLATELET - Abnormal; Notable for the following components:      Result Value   WBC 12.8 (*)    Hemoglobin 15.5 (*)    Neutro Abs 10.3 (*)    All other components within normal limits  COMPREHENSIVE METABOLIC PANEL - Abnormal; Notable for the following components:   Potassium 2.8 (*)    CO2 21 (*)    Glucose, Bld 106 (*)    Total Protein 8.8 (*)    Albumin 5.2 (*)    All other components within normal limits  URINALYSIS, ROUTINE W REFLEX MICROSCOPIC - Abnormal; Notable for the following components:   APPearance CLOUDY (*)    Specific Gravity, Urine >1.030 (*)    Hgb urine dipstick SMALL (*)    Bilirubin Urine SMALL (*)    Ketones, ur 15 (*)    All other components within normal limits  URINALYSIS, MICROSCOPIC (REFLEX) - Abnormal; Notable for the following components:   Bacteria, UA FEW (*)    All other components within normal limits  SARS CORONAVIRUS 2 BY RT PCR (HOSPITAL ORDER, PERFORMED IN [facility])  C DIFFICILE QUICK SCREEN W PCR REFLEX  LIPASE, BLOOD  PREGNANCY, URINE    EKG EKG Interpretation  Date/Time:  Wednesday December 03 2019 18:51:45 EST Ventricular Rate:  90 PR Interval:    QRS Duration: 95 QT Interval:  374 QTC Calculation: 458 R Axis:   13 Text Interpretation: Sinus rhythm Low voltage, precordial leads Abnormal R-wave progression, early transition Borderline T abnormalities, anterior leads No significant change was found Confirmed by Glynn Octave [id number]) on 12/03/2019 7:07:21 PM    Radiology No results found.  Procedures Procedures (including critical care time)  Medications Ordered in ED Medications  promethazine (PHENERGAN) injection 12.5 mg (12.5 mg Intravenous Given 12/03/19 1833)  sodium chloride 0.9 % bolus 1,000 mL (0 mLs Intravenous Stopped 12/03/19 1953)  potassium chloride 10 mEq in 100 mL IVPB (0 mEq Intravenous Stopped 12/03/19 2227)  promethazine (PHENERGAN) injection 12.5 mg (12.5 mg Intravenous Given 12/03/19 2120)  prochlorperazine (COMPAZINE) injection 10 mg (10 mg Intravenous Given 12/03/19 2230)    ED Course  I have reviewed the triage vital signs and the nursing notes.  Pertinent labs & imaging results that were available during my care of the patient were reviewed by me and considered in my medical decision making (see chart for details).  39 year old female presents with nausea and diarrhea for 4 days. She reports 15 pound weight loss since then and is not getting relief with Zofran. Her vitals are reassuring here. Heart is regular rate and rhythm. Lungs are CTA. Abdomen is soft and non-tender. Will start fluids and antiemetics. Will obtain labs. COVID ordered.   CBC is remarkable for mild leukocytosis (12.8) and elevated hgb (15.5). Possibly hemoconcentration. CMP is remarkable for hypokalemia (2.8), low bicarb (21). UA has 15 ketones and >1.030 specific gravity. COVID is negative. Will give IV K.   Pt did have episode of diarrhea here - will obtain C-diff. After 3 doses of antiemetics pt has  been able to tolerate sips of fluids but does not feel entirely better. She is requesting discharge in the hospital if no further work up is going to be done. We discussed possible admission but I could not say for sure that GI will come to an EGD and colonoscopy here although we could admit for ongoing hydration and symptom control. She states that she would prefer to go home and call GI in the morning. Will send in rx for Zofran, Phenergan, and potassium to a  24 hour pharmacy for her. She was encouraged to f/u.   MDM Rules/Calculators/A&P                       Final Clinical Impression(s) / ED Diagnoses Final diagnoses:  Nausea  Diarrhea, unspecified type  Hypokalemia    Rx / DC Orders ED Discharge Orders    None       Recardo Evangelist, PA-C 12/03/19 2336    Ezequiel Essex, MD 12/04/19 [phone]

## 2019-12-03 NOTE — Progress Notes (Signed)
Please call and let her know labs  look good , kidney function good so not severely dehydrated - K+ is low from diarrhea- please call in Kdur 20 meq - one po BID x 5 days  thanks

## 2019-12-03 NOTE — ED Notes (Signed)
Pt unable to urinate at this time, bolus running. 

## 2019-12-03 NOTE — Telephone Encounter (Signed)
Last took Imodium at 9 am. Last spell of diarrhea was about 10 am. She has had water today. She will keep her appointment.

## 2019-12-03 NOTE — ED Notes (Signed)
Pt ambulated to bathroom without difficulty.

## 2019-12-03 NOTE — ED Notes (Signed)
Pt states continues feeling nauseous. Took zofran odt immediately prior to arrival.  Attempt venipuncture for lab collection unsuccessful x1, pt to wait until room placement to attempt IV for blood collection.

## 2019-12-03 NOTE — Patient Instructions (Signed)
We have sent the following medications to your pharmacy for you to pick up at your convenience: Phenergan 25 mg twice daily  Please go to [address] for COVID testing.  While awaiting results, please remain in isolation at home.     Person Under Monitoring Name: Alison Clarke  Location: [address] [place] [address]   Infection Prevention Recommendations for Individuals Confirmed to have, or Being Evaluated for, 2019 Novel Coronavirus (COVID-19) Infection Who Receive Care at Home  Individuals who are confirmed to have, or are being evaluated for, COVID-19 should follow the prevention steps below until a healthcare provider or local or state health department says they can return to normal activities.  Stay home except to get medical care You should restrict activities outside your home, except for getting medical care. Do not go to work, school, or public areas, and do not use public transportation or taxis.  Call ahead before visiting your doctor Before your medical appointment, call the healthcare provider and tell them that you have, or are being evaluated for, COVID-19 infection. This will help the healthcare provider's office take steps to keep other people from getting infected. Ask your healthcare provider to call the local or state health department.  Monitor your symptoms Seek prompt medical attention if your illness is worsening (e.g., difficulty breathing). Before going to your medical appointment, call the healthcare provider and tell them that you have, or are being evaluated for, COVID-19 infection. Ask your healthcare provider to call the local or state health department.  Wear a facemask You should wear a facemask that covers your nose and mouth when you are in the same room with other people and when you visit a healthcare provider. People who live with or visit you should also wear a facemask while they are in the same room with  you.  Separate yourself from other people in your home As much as possible, you should stay in a different room from other people in your home. Also, you should use a separate bathroom, if available.  Avoid sharing household items You should not share dishes, drinking glasses, cups, eating utensils, towels, bedding, or other items with other people in your home. After using these items, you should wash them thoroughly with soap and water.  Cover your coughs and sneezes Cover your mouth and nose with a tissue when you cough or sneeze, or you can cough or sneeze into your sleeve. Throw used tissues in a lined trash can, and immediately wash your hands with soap and water for at least 20 seconds or use an alcohol-based hand rub.  Wash your [employer] your hands often and thoroughly with soap and water for at least 20 seconds. You can use an alcohol-based hand sanitizer if soap and water are not available and if your hands are not visibly dirty. Avoid touching your eyes, nose, and mouth with unwashed hands.   Prevention Steps for Caregivers and Household Members of Individuals Confirmed to have, or Being Evaluated for, COVID-19 Infection Being Cared for in the Home  If you live with, or provide care at home for, a person confirmed to have, or being evaluated for, COVID-19 infection please follow these guidelines to prevent infection:  Follow healthcare provider's instructions Make sure that you understand and can help the patient follow any healthcare provider instructions for all care.  Provide for the patient's basic needs You should help the patient with basic needs in the home and provide support  for getting groceries, prescriptions, and other personal needs.  Monitor the patient's symptoms If they are getting sicker, call his or her medical provider and tell them that the patient has, or is being evaluated for, COVID-19 infection. This will help the healthcare provider's office  take steps to keep other people from getting infected. Ask the healthcare provider to call the local or state health department.  Limit the number of people who have contact with the patient  If possible, have only one caregiver for the patient.  Other household members should stay in another home or place of residence. If this is not possible, they should stay  in another room, or be separated from the patient as much as possible. Use a separate bathroom, if available.  Restrict visitors who do not have an essential need to be in the home.  Keep older adults, very young children, and other sick people away from the patient Keep older adults, very young children, and those who have compromised immune systems or chronic health conditions away from the patient. This includes people with chronic heart, lung, or kidney conditions, diabetes, and cancer.  Ensure good ventilation Make sure that shared spaces in the home have good air flow, such as from an air conditioner or an opened window, weather permitting.  Wash your hands often  Wash your hands often and thoroughly with soap and water for at least 20 seconds. You can use an alcohol based hand sanitizer if soap and water are not available and if your hands are not visibly dirty.  Avoid touching your eyes, nose, and mouth with unwashed hands.  Use disposable paper towels to dry your hands. If not available, use dedicated cloth towels and replace them when they become wet.  Wear a facemask and gloves  Wear a disposable facemask at all times in the room and gloves when you touch or have contact with the patient's blood, body fluids, and/or secretions or excretions, such as sweat, saliva, sputum, nasal mucus, vomit, urine, or feces.  Ensure the mask fits over your nose and mouth tightly, and do not touch it during use.  Throw out disposable facemasks and gloves after using them. Do not reuse.  Wash your hands immediately after removing  your facemask and gloves.  If your personal clothing becomes contaminated, carefully remove clothing and launder. Wash your hands after handling contaminated clothing.  Place all used disposable facemasks, gloves, and other waste in a lined container before disposing them with other household waste.  Remove gloves and wash your hands immediately after handling these items.  Do not share dishes, glasses, or other household items with the patient  Avoid sharing household items. You should not share dishes, drinking glasses, cups, eating utensils, towels, bedding, or other items with a patient who is confirmed to have, or being evaluated for, COVID-19 infection.  After the person uses these items, you should wash them thoroughly with soap and water.  Wash laundry thoroughly  Immediately remove and wash clothes or bedding that have blood, body fluids, and/or secretions or excretions, such as sweat, saliva, sputum, nasal mucus, vomit, urine, or feces, on them.  Wear gloves when handling laundry from the patient.  Read and follow directions on labels of laundry or clothing items and detergent. In general, wash and dry with the warmest temperatures recommended on the label.  Clean all areas the individual has used often  Clean all touchable surfaces, such as counters, tabletops, doorknobs, bathroom fixtures, toilets, phones, keyboards, tablets, and  bedside tables, every day. Also, clean any surfaces that may have blood, body fluids, and/or secretions or excretions on them.  Wear gloves when cleaning surfaces the patient has come in contact with.  Use a diluted bleach solution (e.g., dilute bleach with 1 part bleach and 10 parts water) or a household disinfectant with a label that says EPA-registered for coronaviruses. To make a bleach solution at home, add 1 tablespoon of bleach to 1 quart (4 cups) of water. For a larger supply, add  cup of bleach to 1 gallon (16 cups) of water.  Read labels  of cleaning products and follow recommendations provided on product labels. Labels contain instructions for safe and effective use of the cleaning product including precautions you should take when applying the product, such as wearing gloves or eye protection and making sure you have good ventilation during use of the product.  Remove gloves and wash hands immediately after cleaning.  Monitor yourself for signs and symptoms of illness Caregivers and household members are considered close contacts, should monitor their health, and will be asked to limit movement outside of the home to the extent possible. Follow the monitoring steps for close contacts listed on the symptom monitoring form.   ? If you have additional questions, contact your local health department or call the epidemiologist on call at [phone] (available 24/7). ? This guidance is subject to change. For the most up-to-date guidance from [facility], please refer to their website: YouBlogs.pl

## 2019-12-03 NOTE — ED Triage Notes (Addendum)
Pt c/o n/v/d x 4 days , seen by GI today .  Did not gets lab drawn for covid test or fill RX

## 2019-12-04 ENCOUNTER — Telehealth: Payer: Self-pay

## 2019-12-04 ENCOUNTER — Other Ambulatory Visit: Payer: Self-pay

## 2019-12-04 DIAGNOSIS — R197 Diarrhea, unspecified: Secondary | ICD-10-CM

## 2019-12-04 LAB — C DIFFICILE QUICK SCREEN W PCR REFLEX
C Diff antigen: POSITIVE — AB
C Diff toxin: NEGATIVE

## 2019-12-04 LAB — CLOSTRIDIUM DIFFICILE BY PCR, REFLEXED: Toxigenic C. Difficile by PCR: POSITIVE — AB

## 2019-12-04 MED ORDER — VANCOMYCIN HCL 125 MG PO CAPS: 125.0000 mg | ORAL_CAPSULE | Freq: Four times a day (QID) | ORAL | 0 refills | Status: AC

## 2019-12-04 NOTE — Telephone Encounter (Signed)
-----   Message from Napoleon Form, MD sent at 12/04/2019 11:22 AM EST ----- Thank you Tresa Endo for letting me know the results.  Beth, please send Rx for Vancomycin 125mg   four times daily X 2 weeks.  Encourage her to maintain hydration. Recheck BMP on Monday, can be done in [place] so she doesn't have to drive to [place].  Thanks VN ----- Message ----- From: [place] Sent: 12/04/2019  10:54 AM EST To: 12/06/2019, MD  Hi Dr. Napoleon Form,  I wanted to just send a quick message regarding your new patient Alison Clarke. She came to the ED yesterday evening after the office visit with you for nausea and diarrhea. Her C.diff came back positive. Unfortunately this came back after she was discharged. She said she was going to call your office today and I just wanted to reach out to you as well to let you know what was going on. We did discuss admission for fluids and symptom control but ultimately she wanted to go home if "nothing was going to be done" such as a scope or imaging emergently which I didn't feel she needed last night.   Thanks!  -Renette Butters

## 2019-12-04 NOTE — Telephone Encounter (Signed)
Called the patient to advise her of the +C Diff results and plan of care. No answer. Left a message on her voicemail asking her to call back as soon as she is able. Vanco Rx to [facility].

## 2019-12-04 NOTE — Telephone Encounter (Signed)
Spoke with the patient and advised. Detailed discussion on importance of hydration, self-care when sick with C Diff and mode of transmission. Will call her tomorrow and check on her progress.

## 2019-12-05 ENCOUNTER — Other Ambulatory Visit: Payer: Self-pay

## 2019-12-05 DIAGNOSIS — R197 Diarrhea, unspecified: Secondary | ICD-10-CM

## 2019-12-05 NOTE — Telephone Encounter (Signed)
Spoke with the patient. She states her nausea seems to be under control better today. Staying hydrated.  She will go for labs on Monday. She may come to [facility] and understands the [facility] is an option also. She will let us know.

## 2019-12-09 ENCOUNTER — Other Ambulatory Visit (INDEPENDENT_AMBULATORY_CARE_PROVIDER_SITE_OTHER): Payer: Commercial Managed Care - PPO

## 2019-12-09 DIAGNOSIS — R197 Diarrhea, unspecified: Secondary | ICD-10-CM

## 2019-12-09 LAB — BASIC METABOLIC PANEL
BUN: 8 mg/dL (ref 6–23)
CO2: 30 mEq/L (ref 19–32)
Calcium: 8.8 mg/dL (ref 8.4–10.5)
Chloride: 104 mEq/L (ref 96–112)
Creatinine, Ser: 0.66 mg/dL (ref 0.40–1.20)
GFR: 98.73 mL/min (ref >60.00)
Glucose, Bld: 83 mg/dL (ref 70–99)
Potassium: 3.7 mEq/L (ref 3.5–5.1)
Sodium: 138 mEq/L (ref 135–145)

## 2019-12-11 ENCOUNTER — Telehealth: Payer: Self-pay | Admitting: Gastroenterology

## 2019-12-11 NOTE — Telephone Encounter (Signed)
Patient called about lab results. She stated that she can see them in MyChart so would like to know what Dr. Elana Clarke impressions are. Please call patient.

## 2019-12-11 NOTE — Telephone Encounter (Signed)
Spoke with the patient. She is doing better since being on Vancomycin. Still fatigued. Reports her stools are returning to her "normal state of diarrhea." Follow up appointment scheduled in February.

## 2019-12-30 ENCOUNTER — Other Ambulatory Visit: Payer: Self-pay

## 2019-12-30 ENCOUNTER — Encounter: Payer: Self-pay | Admitting: Gastroenterology

## 2019-12-30 ENCOUNTER — Ambulatory Visit (INDEPENDENT_AMBULATORY_CARE_PROVIDER_SITE_OTHER): Payer: Commercial Managed Care - PPO | Admitting: Gastroenterology

## 2019-12-30 ENCOUNTER — Other Ambulatory Visit (INDEPENDENT_AMBULATORY_CARE_PROVIDER_SITE_OTHER): Payer: Commercial Managed Care - PPO

## 2019-12-30 VITALS — BP 116/76 | HR 88 | Temp 98.1°F | Ht 61.0 in | Wt 179.0 lb

## 2019-12-30 DIAGNOSIS — Z8619 Personal history of other infectious and parasitic diseases: Secondary | ICD-10-CM

## 2019-12-30 DIAGNOSIS — K52839 Microscopic colitis, unspecified: Secondary | ICD-10-CM | POA: Diagnosis not present

## 2019-12-30 DIAGNOSIS — R197 Diarrhea, unspecified: Secondary | ICD-10-CM | POA: Diagnosis not present

## 2019-12-30 LAB — BASIC METABOLIC PANEL
BUN: 12 mg/dL (ref 6–23)
CO2: 21 mEq/L (ref 19–32)
Calcium: 8.8 mg/dL (ref 8.4–10.5)
Chloride: 108 mEq/L (ref 96–112)
Creatinine, Ser: 0.76 mg/dL (ref 0.40–1.20)
GFR: 83.87 mL/min (ref >60.00)
Glucose, Bld: 103 mg/dL — ABNORMAL HIGH (ref 70–99)
Potassium: 3.1 mEq/L — ABNORMAL LOW (ref 3.5–5.1)
Sodium: 137 mEq/L (ref 135–145)

## 2019-12-30 MED ORDER — POTASSIUM CHLORIDE ER 20 MEQ PO TBCR: 20.0000 meq | EXTENDED_RELEASE_TABLET | Freq: Every day | ORAL | 0 refills | Status: DC

## 2019-12-30 MED ORDER — ONDANSETRON 4 MG PO TBDP: 4.0000 mg | ORAL_TABLET | Freq: Three times a day (TID) | ORAL | 1 refills | Status: DC | PRN

## 2019-12-30 MED ORDER — BUDESONIDE 3 MG PO CPEP: 9.0000 mg | ORAL_CAPSULE | Freq: Every day | ORAL | 0 refills | Status: DC

## 2019-12-30 NOTE — Progress Notes (Signed)
Alison Clarke    [id number]    10-07-1981  Primary Care Physician:Lindley, Fernand Parkins, FNP  Referring Physician: Armando Gang, FNP [address] [place],  [place] [address]   Chief complaint:  Diarrhea, C.diff  HPI: 39 year old female here for follow-up visit for diarrhea.  She completed 2-week course of oral vancomycin for C. difficile colitis 10 days ago.  She is no longer having multiple episodes of watery diarrhea but is back to what she was having in the past month with 4-5 semiformed bowel movements, she calls it "her baseline diarrhea".  She started taking Entocort last Thursday and she feels the diarrhea has significantly improved and she is having bowel movement now once every other day, and is feeling somewhat constipated. Denies any blood in stool or melena.  No abdominal pain.  No vomiting.  Continues to have intermittent nausea.  Relevant GI history CT abd & pelvis with contrast October 19, 2019: Diffuse mild colon wall thickening.  Bilateral nephrolithiasis nonobstructive  She completed course of antibiotics X2, most recently Cipro and Flagyl in December.   Colonoscopy April 2016: Biopsies positive for collagenous colitis, she was treated with entocort at that time.  Patient says she felt bloated and didn't like being on Entocort  [facility], 2019.  She was given prescription for Bentyl and also Entocort which she has not started.  She feels it made her symptoms worse in the past and she is normal start taking them    Outpatient Encounter Medications as of 12/30/2019  Medication Sig  . budesonide (ENTOCORT EC) 3 MG 24 hr capsule Take 3 capsules (9 mg total) by mouth daily.  . cetirizine (ZYRTEC) 10 MG tablet Take 10 mg by mouth daily.  Marland Kitchen dicyclomine (BENTYL) 10 MG capsule Take 1 capsule (10 mg total) by mouth every 6 (six) hours as needed for spasms (as needed for cramping and diarrhea).  . DULoxetine (CYMBALTA) 30 MG  capsule Take 30 mg by mouth daily.  . fluticasone (FLONASE) 50 MCG/ACT nasal spray Place 2 sprays into both nostrils daily.  Marland Kitchen lamoTRIgine (LAMICTAL) 25 MG tablet Take 25 mg by mouth every evening.   Marland Kitchen levonorgestrel (MIRENA, 52 MG,) 20 MCG/24HR IUD 1 each by Intrauterine route once.  Marland Kitchen levothyroxine (SYNTHROID, LEVOTHROID) 75 MCG tablet Take 75 mcg by mouth daily before breakfast.   . ondansetron (ZOFRAN ODT) 4 MG disintegrating tablet Take 1 tablet (4 mg total) by mouth every 8 (eight) hours as needed for nausea or vomiting.  . potassium chloride SA (KLOR-CON) 20 MEQ tablet Take 2 tablets (40 mEq total) by mouth daily.  . promethazine (PHENERGAN) 25 MG tablet Take 1 tablet (25 mg total) by mouth 2 (two) times daily.  . promethazine (PHENERGAN) 25 MG tablet Take 1 tablet (25 mg total) by mouth every 6 (six) hours as needed for nausea or vomiting.   No facility-administered encounter medications on file as of 12/30/2019.    Allergies as of 12/30/2019 - Review Complete 12/03/2019  Allergen Reaction Noted  . Doxycycline Nausea And Vomiting 07/19/2018  . Morphine and related Nausea And Vomiting 02/22/2015  . Sulfur Hives 12/07/2014  . Vicodin [hydrocodone-acetaminophen] Nausea And Vomiting 02/22/2015  . Compazine [prochlorperazine] Anxiety 12/04/2019    Past Medical History:  Diagnosis Date  . Allergic rhinitis   . Anxiety   . Asthma   . Colitis   . Collagenous colitis 02/25/2015   02/2015 colonoscopy  .  Depression   . Heart murmur    past hx of but not now   . Hypothyroidism   . IBS (irritable bowel syndrome)   . Kidney stones   . Meningitis    age 55 -  . Scarlet fever   . Shingles     Past Surgical History:  Procedure Laterality Date  . COLONOSCOPY    . TONSILLECTOMY    . WISDOM TOOTH EXTRACTION      Family History  Problem Relation Age of Onset  . Hypertension Mother   . Hypertension Father   . Liver cancer Father   . Alcohol abuse Father   . Cirrhosis Father   .  Breast cancer Paternal Aunt   . Uterine cancer Sister   . Colon cancer Neg Hx   . Esophageal cancer Neg Hx   . Rectal cancer Neg Hx     Social History   Socioeconomic History  . Marital status: Married    Spouse name: Not on file  . Number of children: 1  . Years of education: Not on file  . Highest education level: Not on file  Occupational History  . Not on file  Tobacco Use  . Smoking status: Never Smoker  . Smokeless tobacco: Never Used  Substance and Sexual Activity  . Alcohol use: Yes    Alcohol/week: 0.0 standard drinks    Comment: rare  . Drug use: No  . Sexual activity: Yes    Partners: Male    Birth control/protection: OCP, I.U.D.  Other Topics Concern  . 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 Difficulty of Paying Living Expenses: Not on file  Food Insecurity:   . Worried About Charity fundraiser in the Last Year: Not on file  . Ran Out of Food in the Last Year: Not on file  Transportation Needs:   . Lack of Transportation (Medical): Not on file  . Lack of Transportation (Non-Medical): Not on file  Physical Activity:   . Days of Exercise per Week: Not on file  . Minutes of Exercise per Session: Not on file  Stress:   . Feeling of Stress : Not on file  Social Connections:   . Frequency of Communication with Friends and Family: Not on file  . Frequency of Social Gatherings with Friends and Family: Not on file  . Attends Religious Services: Not on file  . Active Member of Clubs or Organizations: Not on file  . Attends Archivist Meetings: Not on file  . Marital Status: Not on file  Intimate Partner Violence:   . Fear of Current or Ex-Partner: Not on file  . Emotionally Abused: Not on file  . Physically Abused: Not on file  . Sexually Abused: Not on file      Review of  systems:  All other review of systems negative except as mentioned in the HPI.   Physical Exam: Vitals:   12/30/19 0957  BP: 116/76  Pulse: 88  Temp: 98.1 F (36.7 C)   Body mass index is 33.82 kg/m. Gen:      No acute distress Abd:      + bowel sounds; soft, non-tender; no palpable masses, no distension Neuro: alert and oriented x 3 Psych: normal mood and affect  Data Reviewed:  Reviewed  labs, radiology imaging, old records and pertinent past GI work up   Assessment and Plan/Recommendations: 39 year old female with history of collagenous colitis, s/p recent treatment for C. difficile colitis with oral vancomycin Start Florastor 1 capsule twice daily for 2 weeks to prevent recurrent C. difficile  Diarrhea is improving on Entocort 9 mg daily, will continue for 2 weeks followed by taper to 6 mg daily for 2 weeks and 3 mg daily for additional 2 weeks  Nausea: intermittent, multifactorial Use Zofran 4 mg daily as needed  Continue to maintain hydration with adequate fluid intake Check BMP to monitor electrolytes and renal function  If has recurrent diarrhea or any significant change in symptoms, will consider colonoscopy for further evaluation Follow-up office visit in 6 to 8 weeks   The patient was provided an opportunity to ask questions and all were answered. The patient agreed with the plan and demonstrated an understanding of the instructions.  Iona Beard , MD    CC: Armando Gang, FNP

## 2019-12-30 NOTE — Patient Instructions (Addendum)
Continue Entocort 9mg  daily for 9 additional days then decrease to 6 mg daily for 2 weeks then decrease to 3 mg daily for 2 weeks  Take OTC Florastor 1 capsule twice daily for 2 weeks  We have refilled your Zofran and sent into your pharmacy  Follow up in 6 weeks   If you are age 41 or older, your body mass index should be between 23-30. Your Body mass index is 33.82 kg/m. If this is out of the aforementioned range listed, please consider follow up with your Primary Care Provider.  If you are age 65 or younger, your body mass index should be between 19-25. Your Body mass index is 33.82 kg/m. If this is out of the aformentioned range listed, please consider follow up with your Primary Care Provider.    I appreciate the  opportunity to care for you  Thank You   [birth year]-16-1969 , MD

## 2019-12-31 ENCOUNTER — Encounter: Payer: Self-pay | Admitting: Gastroenterology

## 2020-01-14 ENCOUNTER — Other Ambulatory Visit: Payer: PRIVATE HEALTH INSURANCE

## 2020-01-14 DIAGNOSIS — R197 Diarrhea, unspecified: Secondary | ICD-10-CM

## 2020-01-14 LAB — BASIC METABOLIC PANEL
BUN: 13 mg/dL (ref 6–23)
CO2: 26 mEq/L (ref 19–32)
Calcium: 9.6 mg/dL (ref 8.4–10.5)
Chloride: 105 mEq/L (ref 96–112)
Creatinine, Ser: 0.9 mg/dL (ref 0.40–1.20)
GFR: 68.99 mL/min (ref >60.00)
Glucose, Bld: 65 mg/dL — ABNORMAL LOW (ref 70–99)
Potassium: 3.9 mEq/L (ref 3.5–5.1)
Sodium: 138 mEq/L (ref 135–145)

## 2020-01-14 NOTE — Addendum Note (Signed)
Addended by: Miguel Aschoff on: 01/14/2020 03:54 PM   Modules accepted: Orders

## 2020-01-27 ENCOUNTER — Other Ambulatory Visit: Payer: Self-pay | Admitting: Gastroenterology

## 2020-01-27 NOTE — Telephone Encounter (Signed)
May I refill , thank you.

## 2020-01-30 ENCOUNTER — Other Ambulatory Visit: Payer: Self-pay

## 2020-01-30 MED ORDER — ONDANSETRON 4 MG PO TBDP: 4.0000 mg | ORAL_TABLET | Freq: Three times a day (TID) | ORAL | 1 refills | Status: DC | PRN

## 2020-02-06 ENCOUNTER — Ambulatory Visit: Payer: PRIVATE HEALTH INSURANCE | Admitting: Physician Assistant

## 2020-02-11 ENCOUNTER — Encounter: Payer: Self-pay | Admitting: Physician Assistant

## 2020-02-11 ENCOUNTER — Ambulatory Visit: Payer: Commercial Managed Care - PPO | Admitting: Physician Assistant

## 2020-02-11 ENCOUNTER — Other Ambulatory Visit (INDEPENDENT_AMBULATORY_CARE_PROVIDER_SITE_OTHER): Payer: Commercial Managed Care - PPO

## 2020-02-11 VITALS — BP 112/70 | HR 83 | Temp 97.7°F | Ht 61.0 in | Wt 178.4 lb

## 2020-02-11 DIAGNOSIS — K52831 Collagenous colitis: Secondary | ICD-10-CM | POA: Diagnosis not present

## 2020-02-11 DIAGNOSIS — R197 Diarrhea, unspecified: Secondary | ICD-10-CM | POA: Diagnosis not present

## 2020-02-11 DIAGNOSIS — Z8619 Personal history of other infectious and parasitic diseases: Secondary | ICD-10-CM

## 2020-02-11 LAB — BASIC METABOLIC PANEL
BUN: 11 mg/dL (ref 6–23)
CO2: 27 mEq/L (ref 19–32)
Calcium: 9.2 mg/dL (ref 8.4–10.5)
Chloride: 104 mEq/L (ref 96–112)
Creatinine, Ser: 0.78 mg/dL (ref 0.40–1.20)
GFR: 81.35 mL/min (ref >60.00)
Glucose, Bld: 77 mg/dL (ref 70–99)
Potassium: 3.8 mEq/L (ref 3.5–5.1)
Sodium: 136 mEq/L (ref 135–145)

## 2020-02-11 LAB — CBC WITH DIFFERENTIAL/PLATELET
Basophils Absolute: 0 10*3/uL (ref 0.0–0.1)
Basophils Relative: 0.8 % (ref 0.0–3.0)
Eosinophils Absolute: 0.1 10*3/uL (ref 0.0–0.7)
Eosinophils Relative: 1.8 % (ref 0.0–5.0)
HCT: 38 % (ref 36.0–46.0)
Hemoglobin: 13.3 g/dL (ref 12.0–15.0)
Lymphocytes Relative: 22.2 % (ref 12.0–46.0)
Lymphs Abs: 1.3 10*3/uL (ref 0.7–4.0)
MCHC: 34.9 g/dL (ref 30.0–36.0)
MCV: 91.1 fl (ref 78.0–100.0)
Monocytes Absolute: 0.5 10*3/uL (ref 0.1–1.0)
Monocytes Relative: 9.3 % (ref 3.0–12.0)
Neutro Abs: 3.9 10*3/uL (ref 1.4–7.7)
Neutrophils Relative %: 65.9 % (ref 43.0–77.0)
Platelets: 240 10*3/uL (ref 150.0–400.0)
RBC: 4.17 Mil/uL (ref 3.87–5.11)
RDW: 13 % (ref 11.5–15.5)
WBC: 5.9 10*3/uL (ref 4.0–10.5)

## 2020-02-11 LAB — SEDIMENTATION RATE: Sed Rate: 2 mm/hr (ref 0–20)

## 2020-02-11 MED ORDER — BUDESONIDE 3 MG PO CPEP: ORAL_CAPSULE | ORAL | 0 refills | Status: DC

## 2020-02-11 MED ORDER — GLYCOPYRROLATE 2 MG PO TABS: ORAL_TABLET | ORAL | 3 refills | Status: DC

## 2020-02-11 MED ORDER — ONDANSETRON 8 MG PO TBDP: 8.0000 mg | ORAL_TABLET | Freq: Four times a day (QID) | ORAL | 2 refills | Status: DC | PRN

## 2020-02-11 NOTE — Progress Notes (Signed)
Subjective:    Patient ID: Alison Clarke, female    DOB: December 26, 1980, 39 y.o.   MRN: [id number]  HPI Alison Clarke is a pleasant 39 year old white female, established with Dr. Silverio Decamp, and known to myself.  She has diagnosis of collagenous colitis. She also had an episode of C. difficile colitis in January 2021.  She completed a course of vancomycin, and was seen by Dr. Silverio Decamp  on 12/30/2019 at which time she was still having 4-5 semiformed bowel movements per day.  She was restarted on Entocort 9 mg p.o. daily and Florastor twice daily x2 weeks.  She was instructed to taper the Entocort over 6 weeks. Patient says she felt pretty well when she had completed the course of Entocort, however after stopping immediately developed recurrent diarrhea.  She says this is consistent with "her normal" diarrhea rather than an infectious diarrhea.  She is having 4-5 diarrheal stools per day, frequently waking her up early in the morning with some urgency and abdominal bloating.  Today she is having some nausea which she says she sometimes gets when her colitis is active.  No vomiting, no fever or chills.  Says she just does not feel good, has had some weakness and fatigue. She had tried Bentyl in the past and did not like how it made her feel, she does not like Imodium because it makes her feel bloated. Patient says that at her best she will have 1-2 semiformed bowel movements per day. She is requesting a refill for Zofran.  Review of Systems Pertinent positive and negative review of systems were noted in the above HPI section.  All other review of systems was otherwise negative.  Outpatient Encounter Medications as of 02/11/2020  Medication Sig  . budesonide (ENTOCORT EC) 3 MG 24 hr capsule Take 9 mg daily for 3 weeks, decrease to 6 mg for 1 month, then 3 mg for 1 month  . cetirizine (ZYRTEC) 10 MG tablet Take 10 mg by mouth daily.  . DULoxetine (CYMBALTA) 30 MG capsule Take 30 mg by mouth daily.  . fluticasone  (FLONASE) 50 MCG/ACT nasal spray Place 2 sprays into both nostrils daily.  Marland Kitchen lamoTRIgine (LAMICTAL) 25 MG tablet Take 25 mg by mouth every evening.   Marland Kitchen levonorgestrel (MIRENA, 52 MG,) 20 MCG/24HR IUD 1 each by Intrauterine route once.  Marland Kitchen levothyroxine (SYNTHROID, LEVOTHROID) 75 MCG tablet Take 75 mcg by mouth daily before breakfast.   . ondansetron (ZOFRAN ODT) 8 MG disintegrating tablet Take 1 tablet (8 mg total) by mouth every 6 (six) hours as needed for nausea or vomiting.  . promethazine (PHENERGAN) 25 MG tablet TAKE 1 TABLET(25 MG) BY MOUTH TWICE DAILY  . [DISCONTINUED] budesonide (ENTOCORT EC) 3 MG 24 hr capsule Take 3 capsules (9 mg total) by mouth daily.  . [DISCONTINUED] ondansetron (ZOFRAN ODT) 4 MG disintegrating tablet Take 1 tablet (4 mg total) by mouth every 8 (eight) hours as needed for nausea or vomiting.  . [DISCONTINUED] potassium chloride 20 MEQ TBCR Take 20 mEq by mouth daily.  Marland Kitchen glycopyrrolate (ROBINUL) 2 MG tablet 1 tablet by mouth 1-2 times daily for diarrhea/ abdominal cramping   No facility-administered encounter medications on file as of 02/11/2020.   Allergies  Allergen Reactions  . Doxycycline Nausea And Vomiting  . Morphine And Related Nausea And Vomiting  . Sulfur Hives    Swell, rash, burns skin  . Vicodin [Hydrocodone-Acetaminophen] Nausea And Vomiting  . Compazine [Prochlorperazine] Anxiety    Pt gets restless  Patient Active Problem List   Diagnosis Date Noted  . Bilateral nephrolithiasis 10/23/2019  . IUD (intrauterine device) in place 10/23/2019  . Collagenous colitis 02/25/2015   Social History   Socioeconomic History  . Marital status: Married    Spouse name: Not on file  . Number of children: 1  . Years of education: Not on file  . Highest education level: Not on file  Occupational History  . Not on file  Tobacco Use  . Smoking status: Never Smoker  . Smokeless tobacco: Never Used  Substance and Sexual Activity  . Alcohol use: Yes     Alcohol/week: 0.0 standard drinks    Comment: rare  . Drug use: No  . Sexual activity: Yes    Partners: Male    Birth control/protection: OCP, I.U.D.  Other Topics Concern  . 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 Difficulty of Paying Living Expenses:   Food Insecurity:   . Worried About Charity fundraiser in the Last Year:   . Arboriculturist in the Last Year:   Transportation Needs:   . Film/video editor (Medical):   Marland Kitchen Lack of Transportation (Non-Medical):   Physical Activity:   . Days of Exercise per Week:   . Minutes of Exercise per Session:   Stress:   . Feeling of Stress :   Social Connections:   . Frequency of Communication with Friends and Clarke:   . Frequency of Social Gatherings with Friends and Clarke:   . Attends Religious Services:   . Active Member of Clubs or Organizations:   . Attends Archivist Meetings:   Marland Kitchen Marital Status:   Intimate Partner Violence:   . Fear of Current or Ex-Partner:   . Emotionally Abused:   Marland Kitchen Physically Abused:   . Sexually Abused:     Alison Clarke history includes Alcohol abuse in her father; Breast cancer in her paternal aunt; Cirrhosis in her father; Hypertension in her father and mother; Liver cancer in her father; Uterine cancer in her sister.      Objective:    Vitals:   02/11/20 1147  BP: 112/70  Pulse: 83  Temp: 97.7 F (36.5 C)    Physical Exam Well-developed well-nourished female in no acute distress.  Height, Weight, 178 BMI 33.7  HEENT; nontraumatic normocephalic, EOMI, PE RR LA, sclera anicteric. Oropharynx; not done Neck; supple, no JVD Cardiovascular; regular rate and rhythm with S1-S2, no murmur rub or gallop Pulmonary; Clear bilaterally Abdomen; soft, she has mild tenderness across the lower abdomen no guarding  nondistended, no palpable mass or hepatosplenomegaly, bowel sounds are somewhat hyper -active Rectal; not done today Skin; benign exam, no jaundice rash or appreciable lesions Extremities; no clubbing cyanosis or edema skin warm and dry Neuro/Psych; alert and oriented x4, grossly nonfocal mood and affect appropriate       Assessment & Plan:   #24 38 year old female with collagenous colitis, and recent episode of C. difficile colitis January 2021. Patient had resolution of symptoms after course of vancomycin. She completed a 6-week tapered course of Entocort mid March, and had immediate recurrence of diarrhea and lower abdominal discomfort.  She is having 4-5 diarrheal stools per day, also complains of abdominal bloating , nausea over the past couple of days  general malaise and fatigue.  Suspect still has active collagenous colitis and may need to do longer slower taper of Entocort.  Rule out relapse of C. difficile  Plan; CBC, be met, sed rate and C. difficile PCR today Restart Florastor 1 p.o. twice daily Start trial of glycopyrrolate 2 mg p.o. twice daily Refill Zofran 8 mg p.o. every 6 to 8 hours as needed for nausea. Restart Entocort at 9 mg p.o. daily x3 weeks (patient had already restarted last week), then decrease to 6 mg p.o. daily x1 month then decrease to 3 mg daily x1 month. Office follow-up in 4 to 6 weeks, sooner if needed.   Tashera Montalvo Genia Harold PA-C 02/11/2020   Cc: Remi Haggard, FNP

## 2020-02-11 NOTE — Patient Instructions (Addendum)
If you are age 41 or older, your body mass index should be between 23-30. Your Body mass index is 33.7 kg/m. If this is out of the aforementioned range listed, please consider follow up with your Primary Care Provider.  If you are age 31 or younger, your body mass index should be between 19-25. Your Body mass index is 33.7 kg/m. If this is out of the aformentioned range listed, please consider follow up with your Primary Care Provider.   Your provider has requested that you go to the basement level for lab work before leaving today. Press "B" on the elevator. The lab is located at the first door on the left as you exit the elevator.  Due to recent changes in healthcare laws, you may see the results of your imaging and laboratory studies on MyChart before your provider has had a chance to review them.  We understand that in some cases there may be results that are confusing or concerning to you. Not all laboratory results come back in the same time frame and the provider may be waiting for multiple results in order to interpret others.  Please give [place] 48 hours in order for your provider to thoroughly review all the results before contacting the office for clarification of your results.   Continue Entocort 3 mg, take 9 mg for 3 weeks, decrease to 6 mg for 1 month, then decrease to 3 mg daily for a month.  Continue Zofran 8 mg every 6 hours as needed for nausea/vomiting.  START Glycopyrrolate 2 mg take 1 tablet 1-2 times a day for diarrhea/Abdominal cramping.  RESTART Florastor twice a day for 2 weeks.  Follow up with Dr. Lavon Paganini in 4-5 weeks. Keep you previously scheduled appointment in April if needed.

## 2020-02-16 NOTE — Progress Notes (Signed)
Reviewed and agree with documentation and assessment and plan. K. Veena Laural Eiland , MD   

## 2020-02-23 ENCOUNTER — Ambulatory Visit: Payer: PRIVATE HEALTH INSURANCE | Admitting: Gastroenterology

## 2020-03-19 ENCOUNTER — Ambulatory Visit: Payer: Commercial Managed Care - PPO | Admitting: Gastroenterology

## 2020-03-23 ENCOUNTER — Encounter: Payer: Self-pay | Admitting: Gastroenterology

## 2020-03-23 ENCOUNTER — Ambulatory Visit: Payer: Commercial Managed Care - PPO | Admitting: Gastroenterology

## 2020-03-23 VITALS — BP 104/72 | HR 72 | Temp 97.5°F | Ht 61.0 in | Wt 177.2 lb

## 2020-03-23 DIAGNOSIS — K52831 Collagenous colitis: Secondary | ICD-10-CM

## 2020-03-23 DIAGNOSIS — Z8619 Personal history of other infectious and parasitic diseases: Secondary | ICD-10-CM

## 2020-03-23 DIAGNOSIS — K52839 Microscopic colitis, unspecified: Secondary | ICD-10-CM

## 2020-03-23 DIAGNOSIS — R197 Diarrhea, unspecified: Secondary | ICD-10-CM

## 2020-03-23 DIAGNOSIS — R110 Nausea: Secondary | ICD-10-CM

## 2020-03-23 MED ORDER — ONDANSETRON 8 MG PO TBDP: 8.0000 mg | ORAL_TABLET | Freq: Four times a day (QID) | ORAL | 1 refills | Status: DC | PRN

## 2020-03-23 MED ORDER — PROMETHAZINE HCL 25 MG PO TABS: 25.0000 mg | ORAL_TABLET | Freq: Every day | ORAL | 1 refills | Status: DC

## 2020-03-23 MED ORDER — SUTAB 1479-225-188 MG PO TABS: 24.0000 | ORAL_TABLET | ORAL | 0 refills | Status: DC

## 2020-03-23 MED ORDER — BISMUTH SUBSALICYLATE 262 MG PO CHEW: 524.0000 mg | CHEWABLE_TABLET | Freq: Three times a day (TID) | ORAL | 1 refills | Status: AC

## 2020-03-23 NOTE — Patient Instructions (Addendum)
You have been scheduled for a colonoscopy. Please follow written instructions given to you at your visit today.  Please pick up your prep supplies at the pharmacy within the next 1-3 days. If you use inhalers (even only as needed), please bring them with you on the day of your procedure.  We have sent the following medications to your pharmacy for you to pick up at your convenience: Sutab, Zofran, Promethazine, and Bismuth   Start Bismuth- three times daily.  Continue Zofran as directed.   Continue Budesonide taper as directed.   Decrease your Phenergan to once daily.   Follow-up in 3 months.   If you are age 59 or older, your body mass index should be between 23-30. Your Body mass index is 33.49 kg/m. If this is out of the aforementioned range listed, please consider follow up with your Primary Care Provider.  If you are age 100 or younger, your body mass index should be between 19-25. Your Body mass index is 33.49 kg/m. If this is out of the aformentioned range listed, please consider follow up with your Primary Care Provider.    Thank you for choosing me and [facility].  Dr.Nandigam

## 2020-03-23 NOTE — Progress Notes (Signed)
Alison Clarke    [id number]    Jun 20, 1981  Primary Care Physician:Lindley, Fernand Parkins, FNP  Referring Physician: Armando Gang, FNP [address] [place],  [place] [address]   Chief complaint:  Diarrhea  HPI:  39 year old female with history of collagenous colitis, recent episode of C. difficile colitis January 2021 s/p treatment with oral vancomycin here for follow-up visit for chronic diarrhea  She completed 101-month course of Entocort, symptoms were better when she was taking budesonide but once she tapered off diarrhea returned.  She was having 5-10 loose watery bowel movements per day  She got restarted on Entocort 9 mg daily after visit with Amy on February 11, 2020.  She took it for a month and is currently tapering it down to 6 mg daily  She is currently having 2-3 soft bowel movements daily.  No blood in stool or melena. She continues to have intermittent nausea  Relevant GI history CT abd & pelviswith contrast October 19, 2019: Diffuse mild colon wall thickening. Bilateral nephrolithiasis nonobstructive  She completed course ofantibiotics X2, most recentlyCipro and Flagylin December.  Colonoscopy April 2016: Biopsies positive for collagenous colitis.   Outpatient Encounter Medications as of 03/23/2020  Medication Sig  . budesonide (ENTOCORT EC) 3 MG 24 hr capsule Take 9 mg daily for 3 weeks, decrease to 6 mg for 1 month, then 3 mg for 1 month  . cetirizine (ZYRTEC) 10 MG tablet Take 10 mg by mouth daily.  . DULoxetine (CYMBALTA) 30 MG capsule Take 30 mg by mouth daily.  . fluticasone (FLONASE) 50 MCG/ACT nasal spray Place 2 sprays into both nostrils daily.  Marland Kitchen glycopyrrolate (ROBINUL) 2 MG tablet 1 tablet by mouth 1-2 times daily for diarrhea/ abdominal cramping  . lamoTRIgine (LAMICTAL) 25 MG tablet Take 25 mg by mouth every evening.   Marland Kitchen levonorgestrel (MIRENA, 52 MG,) 20 MCG/24HR IUD 1 each by Intrauterine route once.  Marland Kitchen levothyroxine  (SYNTHROID, LEVOTHROID) 75 MCG tablet Take 75 mcg by mouth daily before breakfast.   . ondansetron (ZOFRAN ODT) 8 MG disintegrating tablet Take 1 tablet (8 mg total) by mouth every 6 (six) hours as needed for nausea or vomiting.  . promethazine (PHENERGAN) 25 MG tablet TAKE 1 TABLET(25 MG) BY MOUTH TWICE DAILY  . topiramate (TOPAMAX) 25 MG tablet Take 50 mg by mouth 2 (two) times daily.   No facility-administered encounter medications on file as of 03/23/2020.    Allergies as of 03/23/2020 - Review Complete 03/23/2020  Allergen Reaction Noted  . Doxycycline Nausea And Vomiting 07/19/2018  . Morphine and related Nausea And Vomiting 02/22/2015  . Sulfur Hives 12/07/2014  . Vicodin [hydrocodone-acetaminophen] Nausea And Vomiting 02/22/2015  . Compazine [prochlorperazine] Anxiety 12/04/2019    Past Medical History:  Diagnosis Date  . Allergic rhinitis   . Anxiety   . Asthma   . Colitis   . Collagenous colitis 02/25/2015   02/2015 colonoscopy  . Depression   . Heart murmur    past hx of but not now   . Hypothyroidism   . IBS (irritable bowel syndrome)   . Kidney stones   . Meningitis    age 77 -  . Scarlet fever   . Shingles     Past Surgical History:  Procedure Laterality Date  . COLONOSCOPY    . LITHOTRIPSY    . TONSILLECTOMY    . WISDOM TOOTH EXTRACTION      Family History  Problem  Relation Age of Onset  . Hypertension Mother   . Hypertension Father   . Liver cancer Father   . Alcohol abuse Father   . Cirrhosis Father   . Breast cancer Paternal Aunt   . Uterine cancer Sister   . Colon cancer Neg Hx   . Esophageal cancer Neg Hx   . Rectal cancer Neg Hx     Social History   Socioeconomic History  . Marital status: Married    Spouse name: Not on file  . Number of children: 1  . Years of education: Not on file  . Highest education level: Not on file  Occupational History  . Not on file  Tobacco Use  . Smoking status: Never Smoker  . Smokeless tobacco:  Never Used  Substance and Sexual Activity  . Alcohol use: Yes    Alcohol/week: 0.0 standard drinks    Comment: rare  . Drug use: No  . Sexual activity: Yes    Partners: Male    Birth control/protection: OCP, I.U.D.  Other Topics Concern  . 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 Difficulty of Paying Living Expenses:   Food Insecurity:   . Worried About Charity fundraiser in the Last Year:   . Arboriculturist in the Last Year:   Transportation Needs:   . Film/video editor (Medical):   Marland Kitchen Lack of Transportation (Non-Medical):   Physical Activity:   . Days of Exercise per Week:   . Minutes of Exercise per Session:   Stress:   . Feeling of Stress :   Social Connections:   . Frequency of Communication with Friends and Family:   . Frequency of Social Gatherings with Friends and Family:   . Attends Religious Services:   . Active Member of Clubs or Organizations:   . Attends Archivist Meetings:   Marland Kitchen Marital Status:   Intimate Partner Violence:   . Fear of Current or Ex-Partner:   . Emotionally Abused:   Marland Kitchen Physically Abused:   . Sexually Abused:       Review of systems: All other review of systems negative except as mentioned in the HPI.   Physical Exam: Vitals:   03/23/20 1343  BP: 104/72  Pulse: 72  Temp: (!) 97.5 F (36.4 C)  SpO2: 99%   Body mass index is 33.49 kg/m. Gen:      No acute distress Abd:      soft, non-tender; no palpable masses, no distension Neuro: alert and oriented x 3 Psych: normal mood and affect  Data Reviewed:  Reviewed labs, radiology imaging, old records and pertinent past GI work up   Assessment and Plan/Recommendations:  39 year old female with history of collagenous colitis initially diagnosed in 2016 with recurrent chronic diarrhea, exacerbation  of symptoms with C. difficile colitis in January 2021 s/p treatment with oral vancomycin and Florastor. She was treated with course of budesonide for 2 months but diarrhea recurred once she was off budesonide in March.  She is currently back on budesonide taper with improvement of symptoms  Schedule for colonoscopy to exclude any other etiology for chronic diarrhea given recurrent symptoms once she completes the budesonide taper, is becoming steroid dependent.  Will need to exclude ulcerative colitis. The risks and benefits as well as  alternatives of endoscopic procedure(s) have been discussed and reviewed. All questions answered. The patient agrees to proceed.  Continue budesonide 6 mg daily Start taking bismuth tablets 1 tablet 3 times daily with meals Use Zofran 4 mg daily as needed for nausea  Return in 2 to 3 months or sooner if needed  The patient was provided an opportunity to ask questions and all were answered. The patient agreed with the plan and demonstrated an understanding of the instructions.  Iona Beard , MD    CC: Armando Gang, FNP

## 2020-03-24 ENCOUNTER — Encounter: Payer: Self-pay | Admitting: Gastroenterology

## 2020-03-30 ENCOUNTER — Telehealth: Payer: Self-pay | Admitting: Gastroenterology

## 2020-03-30 NOTE — Telephone Encounter (Signed)
Called and spoke with pharmacist at [facility] - they did not run coupon code. Coupon Code was ran and cost to pt will be 40.00 dollars. Pt has been informed.

## 2020-03-30 NOTE — Telephone Encounter (Signed)
Pt called stating that copay for sutab is over $100. She wants to know if there is an alternative prep.

## 2020-04-02 ENCOUNTER — Other Ambulatory Visit: Payer: Self-pay

## 2020-04-02 ENCOUNTER — Encounter: Payer: Self-pay | Admitting: Gastroenterology

## 2020-04-02 ENCOUNTER — Ambulatory Visit (AMBULATORY_SURGERY_CENTER): Payer: Commercial Managed Care - PPO | Admitting: Gastroenterology

## 2020-04-02 VITALS — BP 115/77 | HR 70 | Temp 97.5°F | Resp 11 | Ht 61.0 in | Wt 177.0 lb

## 2020-04-02 DIAGNOSIS — R197 Diarrhea, unspecified: Secondary | ICD-10-CM

## 2020-04-02 DIAGNOSIS — K52831 Collagenous colitis: Secondary | ICD-10-CM | POA: Diagnosis present

## 2020-04-02 DIAGNOSIS — K649 Unspecified hemorrhoids: Secondary | ICD-10-CM | POA: Diagnosis not present

## 2020-04-02 MED ORDER — SODIUM CHLORIDE 0.9 % IV SOLN: 500.0000 mL | Freq: Once | INTRAVENOUS | Status: DC

## 2020-04-02 NOTE — Op Note (Signed)
[facility] Patient Name: Alison Clarke Procedure Date: 04/02/2020 11:39 AM MRN: [id number] Endoscopist: Napoleon Form , MD Age: 39 Referring MD:  Date of Birth: 13-May-1981 Gender: Female Account #: [id number] Procedure:                Colonoscopy Indications:              Clinically significant diarrhea of unexplained                            origin. H/o collagenous colitis 2016, recurrent                            diarrhea despite therapy. Exclude IBD/ulcerative                            colitis Medicines:                Monitored Anesthesia Care Procedure:                Pre-Anesthesia Assessment:                           - Prior to the procedure, a History and Physical                            was performed, and patient medications and                            allergies were reviewed. The patient's tolerance of                            previous anesthesia was also reviewed. The risks                            and benefits of the procedure and the sedation                            options and risks were discussed with the patient.                            All questions were answered, and informed consent                            was obtained. Prior Anticoagulants: The patient has                            taken no previous anticoagulant or antiplatelet                            agents. ASA Grade Assessment: II - A patient with                            mild systemic disease. After reviewing the risks  and benefits, the patient was deemed in                            satisfactory condition to undergo the procedure.                           After obtaining informed consent, the colonoscope                            was passed under direct vision. Throughout the                            procedure, the patient's blood pressure, pulse, and                            oxygen saturations were monitored continuously. The                            Colonoscope was introduced through the anus and                            advanced to the the cecum, identified by                            appendiceal orifice and ileocecal valve. The                            colonoscopy was performed without difficulty. The                            patient tolerated the procedure well. The quality                            of the bowel preparation was excellent. The                            terminal ileum, ileocecal valve, appendiceal                            orifice, and rectum were photographed. Scope In: 11:54:48 AM Scope Out: 12:06:30 PM Scope Withdrawal Time: 0 hours 7 minutes 51 seconds  Total Procedure Duration: 0 hours 11 minutes 42 seconds  Findings:                 The perianal and digital rectal examinations were                            normal.                           Altered vasculature and healed scars in the right                            colon otherwise normal mucosa was found in the  entire colon. Biopsies for histology were taken                            with a cold forceps from the right colon and left                            colon for evaluation of microscopic colitis.                           Non-bleeding internal hemorrhoids were found during                            retroflexion. The hemorrhoids were small. Complications:            No immediate complications. Estimated Blood Loss:     Estimated blood loss was minimal. Impression:               - Normal mucosa in the entire examined colon.                            Biopsied.                           - Non-bleeding internal hemorrhoids. Recommendation:           - Patient has a contact number available for                            emergencies. The signs and symptoms of potential                            delayed complications were discussed with the                            patient. Return to  normal activities tomorrow.                            Written discharge instructions were provided to the                            patient.                           - Resume previous diet.                           - Continue present medications.                           - Await pathology results.                           - Repeat colonoscopy in 10 years for screening                            purposes.                           -  Return to GI office at the next available                            appointment in 4-6 weeks. Napoleon Form, MD 04/02/2020 12:18:07 PM This report has been signed electronically.

## 2020-04-02 NOTE — Patient Instructions (Addendum)
YOU HAD AN ENDOSCOPIC PROCEDURE TODAY AT THE [facility]:   Refer to the procedure report that was given to you for any specific questions about what was found during the examination.  If the procedure report does not answer your questions, please call your gastroenterologist to clarify.  If you requested that your care partner not be given the details of your procedure findings, then the procedure report has been included in a sealed envelope for you to review at your convenience later.  YOU SHOULD EXPECT: Some feelings of bloating in the abdomen. Passage of more gas than usual.  Walking can help get rid of the air that was put into your GI tract during the procedure and reduce the bloating. If you had a lower endoscopy (such as a colonoscopy or flexible sigmoidoscopy) you may notice spotting of blood in your stool or on the toilet paper. If you underwent a bowel prep for your procedure, you may not have a normal bowel movement for a few days.  Please Note:  You might notice some irritation and congestion in your nose or some drainage.  This is from the oxygen used during your procedure.  There is no need for concern and it should clear up in a day or so.  SYMPTOMS TO REPORT IMMEDIATELY:   Following lower endoscopy (colonoscopy or flexible sigmoidoscopy):  Excessive amounts of blood in the stool  Significant tenderness or worsening of abdominal pains  Swelling of the abdomen that is new, acute  Fever of 100F or higher   For urgent or emergent issues, a gastroenterologist can be reached at any hour by calling [phone]. Do not use MyChart messaging for urgent concerns.    DIET:  We do recommend a small meal at first, but then you may proceed to your regular diet.  Drink plenty of fluids but you should avoid alcoholic beverages for 24 hours.  ACTIVITY:  You should plan to take it easy for the rest of today and you should NOT DRIVE or use heavy machinery until tomorrow (because  of the sedation medicines used during the test).    FOLLOW UP: Our staff will call the number listed on your records 48-72 hours following your procedure to check on you and address any questions or concerns that you may have regarding the information given to you following your procedure. If we do not reach you, we will leave a message.  We will attempt to reach you two times.  During this call, we will ask if you have developed any symptoms of COVID 19. If you develop any symptoms (ie: fever, flu-like symptoms, shortness of breath, cough etc.) before then, please call [phone].  If you test positive for Covid 19 in the 2 weeks post procedure, please call and report this information to [place].    If any biopsies were taken you will be contacted by phone or by letter within the next 1-3 weeks.  Please call us at [phone] if you have not heard about the biopsies in 3 weeks.    SIGNATURES/CONFIDENTIALITY: You and/or your care partner have signed paperwork which will be entered into your electronic medical record.  These signatures attest to the fact that that the information above on your After Visit Summary has been reviewed and is understood.  Full responsibility of the confidentiality of this discharge information lies with you and/or your care-partner.     Handout was given to you on hemorrhoids. Per Dr. Lavon Paganini continue Entocort 1 daily.  Pepto bismol up to 3 tabs four times per day as needed.  Will turn your stool black FYI.  You may resume your other current medications today. Await biopsy results. Repeat colonoscopy in 10 years for screening purposes. The office will call you with an appointment to follow up with Dr. Silverio Decamp for 4-6 weeks.  If you have not heard from the office in 1 week, please call the office to make the appointment. Please call if any questions or concerns.

## 2020-04-02 NOTE — Progress Notes (Signed)
Pt's states no medical or surgical changes since previsit or office visit. 

## 2020-04-02 NOTE — Progress Notes (Signed)
Called to room to assist during endoscopic procedure.  Patient ID and intended procedure confirmed with present staff. Received instructions for my participation in the procedure from the performing physician.  

## 2020-04-02 NOTE — Progress Notes (Signed)
Report to PACU, RN, vss, BBS= Clear.  

## 2020-04-05 ENCOUNTER — Other Ambulatory Visit: Payer: Self-pay | Admitting: Physician Assistant

## 2020-04-06 ENCOUNTER — Telehealth: Payer: Self-pay

## 2020-04-06 ENCOUNTER — Other Ambulatory Visit: Payer: Self-pay

## 2020-04-06 NOTE — Telephone Encounter (Signed)
2nd follow up call made.  NALM 

## 2020-04-06 NOTE — Telephone Encounter (Signed)
Left message on answering machine. 

## 2020-04-16 ENCOUNTER — Other Ambulatory Visit: Payer: Self-pay | Admitting: Gastroenterology

## 2020-04-16 DIAGNOSIS — R42 Dizziness and giddiness: Secondary | ICD-10-CM

## 2020-04-16 DIAGNOSIS — Z8669 Personal history of other diseases of the nervous system and sense organs: Secondary | ICD-10-CM

## 2020-04-20 ENCOUNTER — Other Ambulatory Visit: Payer: Self-pay

## 2020-04-20 ENCOUNTER — Encounter: Payer: Self-pay | Admitting: Neurology

## 2020-04-30 ENCOUNTER — Ambulatory Visit: Payer: Commercial Managed Care - PPO

## 2020-05-25 ENCOUNTER — Ambulatory Visit: Payer: Self-pay

## 2020-05-31 ENCOUNTER — Ambulatory Visit: Payer: Commercial Managed Care - PPO | Admitting: Gastroenterology

## 2020-06-08 ENCOUNTER — Ambulatory Visit: Payer: Commercial Managed Care - PPO | Admitting: Gastroenterology

## 2020-07-09 NOTE — Progress Notes (Deleted)
NEUROLOGY CONSULTATION NOTE  Alison Clarke MRN: [id number] DOB: 1981/04/11  Referring provider: Marsa Aris, MD Primary care provider: Franco Nones, FNP  Reason for consult:  Migraines and vertigo  HISTORY OF PRESENT ILLNESS: Alison Clarke is a 39 year old ***-handed female with depression, hypothyroidism, IBS and history of meningitis and kidney stones who presents for migraines and vertigo.  History supplemented by ENT and prior neurologist's notes.  In ***, she began having gradually worsening headaches.  She denied prior history of headaches.  ***.  In June 2020, she developed vertigo (rooom spinning) associated with nausea, tinnitus in right ear with aggravated headache.  ***.  She saw ENT at [facility] with unremarkable workup, including audiogram.  She followed up with a neurologist who diagnosed her with vestibular migraine.  She was prescribed a prednisone taper and started on topiramate and sumatriptan.  ***.  Past therapy included Valium, which provided temporary relief.  Current NSAIDS:  *** Current analgesics:  *** Current triptans:  *** Current ergotamine:  none Current anti-emetic:  Zofran ODT 8mg ; promethazine 25mg  tab Current muscle relaxants:  none Current anti-anxiolytic:  none Current sleep aide:  none Current Antihypertensive medications:  none Current Antidepressant medications:  Cymbalta 30mg  daily Current Anticonvulsant medications:  topiramate 50mg  BID Current anti-CGRP:  none Current Vitamins/Herbal/Supplements:  *** Current Antihistamines/Decongestants:  Zyrtec Other therapy:  *** Hormone/birth control:  Mirena   Past NSAIDS:  *** Past analgesics:  *** Past abortive triptans:  *** Past abortive ergotamine:  *** Past benzodiazepine:  Valium 2mg  Past muscle relaxants:  Flexeril Past anti-emetic:  *** Past antihypertensive medications:  none Past antidepressant medications:  none Past anticonvulsant medications:  Lamotrigine, gabapentin  Past  anti-CGRP:  none Past vitamins/Herbal/Supplements:  none Past antihistamines/decongestants:  *** Other past therapies:  ***  Caffeine:  *** Alcohol:  *** Smoker:  *** Diet:  *** Exercise:  *** Depression:  ***; Anxiety:  *** Other pain:  *** Sleep hygiene:  *** Family history of headache:  ***   PAST MEDICAL HISTORY: Past Medical History:  Diagnosis Date  . Allergic rhinitis   . Anxiety   . Asthma   . Colitis   . Collagenous colitis 02/25/2015   02/2015 colonoscopy  . Depression   . Heart murmur    past hx of but not now   . Hypothyroidism   . IBS (irritable bowel syndrome)   . Kidney stones   . Meningitis    age 57 -  . Scarlet fever   . Shingles     PAST SURGICAL HISTORY: Past Surgical History:  Procedure Laterality Date  . COLONOSCOPY    . LITHOTRIPSY    . TONSILLECTOMY    . WISDOM TOOTH EXTRACTION      MEDICATIONS: Current Outpatient Medications on File Prior to Visit  Medication Sig Dispense Refill  . budesonide (ENTOCORT EC) 3 MG 24 hr capsule TAKE 3 CAPSULES BY MOUTH DAILY FOR 3 WEEKS, THEN 2 CAPSULES DAILY FOR 1 MONTH, THEN 1 CAPSULE DAILY FOR 1 MONTH. 180 capsule 0  . cetirizine (ZYRTEC) 10 MG tablet Take 10 mg by mouth daily.    . DULoxetine (CYMBALTA) 30 MG capsule Take 30 mg by mouth daily.    . fluticasone (FLONASE) 50 MCG/ACT nasal spray Place 2 sprays into both nostrils daily.    glycopyrrolate (ROBINUL) 2 MG tablet 1 tablet by mouth 1-2 times daily for diarrhea/ abdominal cramping 60 tablet 3  . lamoTRIgine (LAMICTAL) 25 MG tablet Take 25 mg by  mouth every evening.   1  . levonorgestrel (MIRENA, 52 MG,) 20 MCG/24HR IUD 1 each by Intrauterine route once.    Marland Kitchen levothyroxine (SYNTHROID, LEVOTHROID) 75 MCG tablet Take 75 mcg by mouth daily before breakfast.   0  . ondansetron (ZOFRAN ODT) 8 MG disintegrating tablet Take 1 tablet (8 mg total) by mouth every 6 (six) hours as needed for nausea or vomiting. 40 tablet 1  . promethazine (PHENERGAN) 25  MG tablet Take 1 tablet (25 mg total) by mouth daily. 30 tablet 1  . topiramate (TOPAMAX) 25 MG tablet Take 50 mg by mouth 2 (two) times daily.     No current facility-administered medications on file prior to visit.    ALLERGIES: Allergies  Allergen Reactions  . Doxycycline Nausea And Vomiting  . Morphine And Related Nausea And Vomiting  . Sulfur Hives    Swell, rash, burns skin  . Vicodin [Hydrocodone-Acetaminophen] Nausea And Vomiting  . Compazine [Prochlorperazine] Anxiety    Pt gets restless    FAMILY HISTORY: Family History  Problem Relation Age of Onset  . Hypertension Mother   . Hypertension Father   . Liver cancer Father   . Alcohol abuse Father   . Cirrhosis Father   . Breast cancer Paternal Aunt   . Uterine cancer Sister   . Colon cancer Neg Hx   . Esophageal cancer Neg Hx   . Rectal cancer Neg Hx     SOCIAL HISTORY: Social History   Socioeconomic History  . Marital status: Married    Spouse name: Not on file  . Number of children: 1  . Years of education: Not on file  . Highest education level: Not on file  Occupational History  . Not on file  Tobacco Use  . Smoking status: Never Smoker  . Smokeless tobacco: Never Used  Vaping Use  . Vaping Use: Never used  Substance and Sexual Activity  . Alcohol use: Yes    Alcohol/week: 0.0 standard drinks    Comment: rare  . Drug use: No  . Sexual activity: Yes    Partners: Male    Birth control/protection: OCP, I.U.D.  Other Topics Concern  . 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 Difficulty of Paying Living Expenses: Not on file  Food Insecurity:   . Worried About Programme researcher, broadcasting/film/video in the Last Year: Not on file  . Ran Out of Food in the Last Year: Not on file  Transportation Needs:   . Lack of Transportation (Medical): Not on file   . Lack of Transportation (Non-Medical): Not on file  Physical Activity:   . Days of Exercise per Week: Not on file  . Minutes of Exercise per Session: Not on file  Stress:   . Feeling of Stress : Not on file  Social Connections:   . Frequency of Communication with Friends and Family: Not on file  . Frequency of Social Gatherings with Friends and Family: Not on file  . Attends Religious Services: Not on file  . Active Member of Clubs or Organizations: Not on file  . Attends Banker Meetings: Not on file  . Marital Status: Not on file  Intimate Partner Violence:   . Fear of Current or Ex-Partner: Not on file  . Emotionally Abused: Not  on file  . Physically Abused: Not on file  . Sexually Abused: Not on file    REVIEW OF SYSTEMS: Constitutional: No fevers, chills, or sweats, no generalized fatigue, change in appetite Eyes: No visual changes, double vision, eye pain Ear, nose and throat: No hearing loss, ear pain, nasal congestion, sore throat Cardiovascular: No chest pain, palpitations Respiratory:  No shortness of breath at rest or with exertion, wheezes GastrointestinaI: No nausea, vomiting, diarrhea, abdominal pain, fecal incontinence Genitourinary:  No dysuria, urinary retention or frequency Musculoskeletal:  No neck pain, back pain Integumentary: No rash, pruritus, skin lesions Neurological: as above Psychiatric: No depression, insomnia, anxiety Endocrine: No palpitations, fatigue, diaphoresis, mood swings, change in appetite, change in weight, increased thirst Hematologic/Lymphatic:  No purpura, petechiae. Allergic/Immunologic: no itchy/runny eyes, nasal congestion, recent allergic reactions, rashes  PHYSICAL EXAM: *** General: No acute distress.  Patient appears ***-groomed.  *** Head:  Normocephalic/atraumatic Eyes:  fundi examined but not visualized Neck: supple, no paraspinal tenderness, full range of motion Back: No paraspinal tenderness Heart:  regular rate and rhythm Lungs: Clear to auscultation bilaterally. Vascular: No carotid bruits. Neurological Exam: Mental status: alert and oriented to person, place, and time, recent and remote memory intact, fund of knowledge intact, attention and concentration intact, speech fluent and not dysarthric, language intact. Cranial nerves: CN I: not tested CN II: pupils equal, round and reactive to light, visual fields intact CN III, IV, VI:  full range of motion, no nystagmus, no ptosis CN V: facial sensation intact CN VII: upper and lower face symmetric CN VIII: hearing intact CN IX, X: gag intact, uvula midline CN XI: sternocleidomastoid and trapezius muscles intact CN XII: tongue midline Bulk & Tone: normal, no fasciculations. Motor:  5/5 throughout *** Sensation:  Pinprick *** temperature *** and vibration sensation intact.  ***. Deep Tendon Reflexes:  2+ throughout, *** toes downgoing.  *** Finger to nose testing:  Without dysmetria.  *** Heel to shin:  Without dysmetria.  *** Gait:  Normal station and stride.  Able to turn and tandem walk. Romberg ***.  IMPRESSION: ***  PLAN: ***  Thank you for allowing me to take part in the care of this patient.  Shon Millet, DO  CC: ***

## 2020-07-12 ENCOUNTER — Ambulatory Visit: Payer: Commercial Managed Care - PPO | Admitting: Neurology

## 2020-07-26 ENCOUNTER — Encounter: Payer: Self-pay | Admitting: Radiology

## 2020-11-27 ENCOUNTER — Other Ambulatory Visit: Payer: Self-pay | Admitting: Gastroenterology

## 2020-12-20 ENCOUNTER — Ambulatory Visit: Payer: Commercial Managed Care - PPO | Admitting: Obstetrics and Gynecology

## 2021-01-10 ENCOUNTER — Ambulatory Visit: Payer: Commercial Managed Care - PPO | Admitting: Obstetrics and Gynecology

## 2021-03-11 ENCOUNTER — Ambulatory Visit
Admission: EM | Admit: 2021-03-11 | Discharge: 2021-03-11 | Disposition: A | Discharge status: Home / Self Care | Payer: Commercial Managed Care - PPO | Attending: Emergency Medicine | Admitting: Emergency Medicine

## 2021-03-11 DIAGNOSIS — L03319 Cellulitis of trunk, unspecified: Secondary | ICD-10-CM | POA: Diagnosis not present

## 2021-03-11 DIAGNOSIS — G43909 Migraine, unspecified, not intractable, without status migrainosus: Secondary | ICD-10-CM

## 2021-03-11 DIAGNOSIS — W57XXXA Bitten or stung by nonvenomous insect and other nonvenomous arthropods, initial encounter: Secondary | ICD-10-CM | POA: Diagnosis not present

## 2021-03-11 DIAGNOSIS — R110 Nausea: Secondary | ICD-10-CM

## 2021-03-11 MED ORDER — CEPHALEXIN 500 MG PO CAPS: 500.0000 mg | ORAL_CAPSULE | Freq: Four times a day (QID) | ORAL | 0 refills | Status: DC

## 2021-03-11 NOTE — ED Provider Notes (Signed)
Alison Clarke    CSN: [id number] Arrival date & time: 03/11/21  1254      History   Chief Complaint Chief Complaint  Patient presents with  . Headache    HPI Alison Clarke is a 40 y.o. female.   Patient presents with 2 bites on her right side x5 days; she noticed redness around the bites today.  She also reports migraine headache with nausea and blurred vision x2 days.  Her headache has improved today.  She denies dizziness, weakness, numbness, fever, chills, sore throat, cough, shortness of breath, vomiting, diarrhea, or other symptoms.  Treatment for her migraine symptoms attempted at home with Bernita Raisin and Imitrex.  Treatment of nausea with Phenergan and Zofran.  Her medical history includes migraine headaches, asthma, allergic rhinitis, IBS, collagenous colitis, kidney stones, depression, anxiety.  The history is provided by the patient and medical records.    Past Medical History:  Diagnosis Date  . Allergic rhinitis   . Anxiety   . Asthma   . Colitis   . Collagenous colitis 02/25/2015   02/2015 colonoscopy  . Depression   . Heart murmur    past hx of but not now   . Hypothyroidism   . IBS (irritable bowel syndrome)   . Kidney stones   . Meningitis    age 51 -  . Scarlet fever   . Shingles     Patient Active Problem List   Diagnosis Date Noted  . Bilateral nephrolithiasis 10/23/2019  . IUD (intrauterine device) in place 10/23/2019  . Collagenous colitis 02/25/2015    Past Surgical History:  Procedure Laterality Date  . COLONOSCOPY    . LITHOTRIPSY    . TONSILLECTOMY    . WISDOM TOOTH EXTRACTION      OB History    Gravida  3   Para  1   Term  1   Preterm      AB  2   Living  1     SAB  1   IAB  1   Ectopic      Multiple      Live Births               Home Medications    Prior to Admission medications   Medication Sig Start Date End Date Taking? Authorizing Provider  cephALEXin (KEFLEX) 500 MG capsule Take 1 capsule  (500 mg total) by mouth 4 (four) times daily. 03/11/21  Yes Mickie Bail, NP  budesonide (ENTOCORT EC) 3 MG 24 hr capsule TAKE 3 CAPSULES BY MOUTH DAILY FOR 3 WEEKS, THEN 2 CAPSULES DAILY FOR 1 MONTH, THEN 1 CAPSULE DAILY FOR 1 MONTH. 04/05/20   Esterwood, Amy S, PA-C  cetirizine (ZYRTEC) 10 MG tablet Take 10 mg by mouth daily.    [provider]  DULoxetine (CYMBALTA) 30 MG capsule Take 30 mg by mouth daily.    [provider]  fluticasone (FLONASE) 50 MCG/ACT nasal spray Place 2 sprays into both nostrils daily.    [provider]  glycopyrrolate (ROBINUL) 2 MG tablet 1 tablet by mouth 1-2 times daily for diarrhea/ abdominal cramping 02/11/20   Esterwood, Amy S, PA-C  lamoTRIgine (LAMICTAL) 25 MG tablet Take 25 mg by mouth every evening.  12/02/14   [provider]  levonorgestrel (MIRENA, 52 MG,) 20 MCG/24HR IUD 1 each by Intrauterine route once.    [provider]  levothyroxine (SYNTHROID, LEVOTHROID) 75 MCG tablet Take 75 mcg by mouth daily before  breakfast.  04/04/15   [provider]  ondansetron (ZOFRAN ODT) 8 MG disintegrating tablet Take 1 tablet (8 mg total) by mouth every 6 (six) hours as needed for nausea or vomiting. 03/23/20   Nandigam, Eleonore ChiquitoKavitha V, MD  promethazine (PHENERGAN) 25 MG tablet TAKE 1 TABLET BY MOUTH DAILY 11/30/20   Napoleon FormNandigam, Kavitha V, MD  topiramate (TOPAMAX) 25 MG tablet Take 50 mg by mouth 2 (two) times daily. 03/01/20   [provider]    Family History Family History  Problem Relation Age of Onset  . Hypertension Mother   . Hypertension Father   . Liver cancer Father   . Alcohol abuse Father   . Cirrhosis Father   . Breast cancer Paternal Aunt   . Uterine cancer Sister   . Colon cancer Neg Hx   . Esophageal cancer Neg Hx   . Rectal cancer Neg Hx     Social History Social History   Tobacco Use  . Smoking status: Never Smoker  . Smokeless tobacco: Never Used  Vaping Use  . Vaping Use: Never used   Substance Use Topics  . Alcohol use: Yes    Alcohol/week: 0.0 standard drinks    Comment: rare  . Drug use: No     Allergies   Doxycycline, Elemental sulfur, Morphine and related, Vicodin [hydrocodone-acetaminophen], and Compazine [prochlorperazine]   Review of Systems Review of Systems  Constitutional: Negative for chills and fever.  HENT: Negative for ear pain and sore throat.   Eyes: Negative for pain and visual disturbance.  Respiratory: Negative for cough and shortness of breath.   Cardiovascular: Negative for chest pain and palpitations.  Gastrointestinal: Positive for nausea. Negative for abdominal pain, diarrhea and vomiting.  Genitourinary: Negative for dysuria and hematuria.  Musculoskeletal: Negative for arthralgias and back pain.  Skin: Positive for color change and wound.  Neurological: Positive for headaches. Negative for dizziness, tremors, seizures, syncope, facial asymmetry, speech difficulty, weakness, light-headedness and numbness.  All other systems reviewed and are negative.    Physical Exam Triage Vital Signs ED Triage Vitals  Enc Vitals Group     BP      Pulse      Resp      Temp      Temp src      SpO2      Weight      Height      Head Circumference      Peak Flow      Pain Score      Pain Loc      Pain Edu?      Excl. in GC?    No data found.  Updated Vital Signs BP 124/85   Pulse 82   Temp 98.2 F (36.8 C)   Resp 19   SpO2 98%   Visual Acuity Right Eye Distance:   Left Eye Distance:   Bilateral Distance:    Right Eye Near:   Left Eye Near:    Bilateral Near:     Physical Exam Vitals and nursing note reviewed.  Constitutional:      General: She is not in acute distress.    Appearance: She is well-developed. She is not ill-appearing.  HENT:     Head: Normocephalic and atraumatic.     Mouth/Throat:     Mouth: Mucous membranes are moist.  Eyes:     Conjunctiva/sclera: Conjunctivae normal.  Cardiovascular:     Rate  and Rhythm: Normal rate and regular rhythm.  Heart sounds: Normal heart sounds.  Pulmonary:     Effort: Pulmonary effort is normal. No respiratory distress.     Breath sounds: Normal breath sounds.  Abdominal:     Palpations: Abdomen is soft.     Tenderness: There is no abdominal tenderness.  Musculoskeletal:        General: Normal range of motion.     Cervical back: Neck supple.  Skin:    General: Skin is warm and dry.     Findings: Erythema and lesion present.     Comments: 2 small pustules with surrounding erythema on right side.  See picture for details.  Neurological:     General: No focal deficit present.     Mental Status: She is alert and oriented to person, place, and time.     Cranial Nerves: No cranial nerve deficit.     Sensory: No sensory deficit.     Motor: No weakness.     Gait: Gait normal.  Psychiatric:        Mood and Affect: Mood normal.        Behavior: Behavior normal.        UC Treatments / Results  Labs (all labs ordered are listed, but only abnormal results are displayed) Labs Reviewed - No data to display  EKG   Radiology No results found.  Procedures Procedures (including critical care time)  Medications Ordered in UC Medications - No data to display  Initial Impression / Assessment and Plan / UC Course  I have reviewed the triage vital signs and the nursing notes.  Pertinent labs & imaging results that were available during my care of the patient were reviewed by me and considered in my medical decision making (see chart for details).   Insect bites on right lateral trunk with cellulitis.  Migraine headache which has improved today.  Nausea without vomiting.  Vital signs stable, no indication of neurological deficit.  Patient declined blood work today, including blood work for tickborne illnesses.  Treating cellulitis with Keflex.  Wound care instructions discussed.  Strict precautions for follow-up if new or worsening symptoms  discussed at length, including fever, rash, increased redness.  Instructed patient to continue her current migraine medications as directed by her PCP.  She states her headache and nausea have improved today.  Instructed her to follow-up with her PCP as needed if her symptoms persist.  ED precautions discussed.  She agrees to plan of care.    Final Clinical Impressions(s) / UC Diagnoses   Final diagnoses:  Insect bite, unspecified site, initial encounter  Cellulitis of trunk, unspecified site of trunk  Migraine without status migrainosus, not intractable, unspecified migraine type  Nausea without vomiting     Discharge Instructions     Take the cephalexin as directed.    Keep your wounds clean and dry.  Wash gently twice a day with soap and water.    Follow-up with your primary care provider or return here if you see have rash, fever, increased pain, increased redness, pus-like drainage, warmth, or other concerning symptoms.    Continue your migraine medications as directed by your primary care provider.  Follow up with your primary care provider if your symptoms are not improving.    Go to the emergency department if you have acute worsening symptoms.        ED Prescriptions    Medication Sig Dispense Auth. Provider   cephALEXin (KEFLEX) 500 MG capsule Take 1 capsule (500 mg total) by mouth  4 (four) times daily. 28 capsule Mickie Bail, NP     I have reviewed the PDMP during this encounter.   Mickie Bail, NP 03/11/21 1341

## 2021-03-11 NOTE — Discharge Instructions (Signed)
Take the cephalexin as directed.    Keep your wounds clean and dry.  Wash gently twice a day with soap and water.    Follow-up with your primary care provider or return here if you see have rash, fever, increased pain, increased redness, pus-like drainage, warmth, or other concerning symptoms.    Continue your migraine medications as directed by your primary care provider.  Follow up with your primary care provider if your symptoms are not improving.    Go to the emergency department if you have acute worsening symptoms.

## 2021-03-11 NOTE — ED Triage Notes (Addendum)
Pt presents with complaints of migraine with nausea and blurry vision x 2 days. Reports using migraine cocktail at home with no relief. Reports she also has a bug bite that she noticed on Sunday on her right flank that has spread. Reports this is not the worst headache of her life. Reports having COVID in January. Pt had meningitis in 2006.

## 2021-08-17 ENCOUNTER — Other Ambulatory Visit: Payer: Self-pay | Admitting: Gastroenterology

## 2021-12-13 ENCOUNTER — Encounter: Payer: Self-pay | Admitting: Emergency Medicine

## 2021-12-13 ENCOUNTER — Other Ambulatory Visit: Payer: Self-pay

## 2021-12-13 ENCOUNTER — Emergency Department (HOSPITAL_COMMUNITY)
Admission: EM | Admit: 2021-12-13 | Discharge: 2021-12-13 | Disposition: A | Discharge status: Home / Self Care | Payer: Commercial Managed Care - PPO | Attending: Emergency Medicine | Admitting: Emergency Medicine

## 2021-12-13 ENCOUNTER — Emergency Department (HOSPITAL_COMMUNITY): Payer: Commercial Managed Care - PPO

## 2021-12-13 ENCOUNTER — Ambulatory Visit
Admission: EM | Admit: 2021-12-13 | Discharge: 2021-12-13 | Payer: Commercial Managed Care - PPO | Attending: Student | Admitting: Student

## 2021-12-13 DIAGNOSIS — Z5321 Procedure and treatment not carried out due to patient leaving prior to being seen by health care provider: Secondary | ICD-10-CM | POA: Insufficient documentation

## 2021-12-13 DIAGNOSIS — Z8679 Personal history of other diseases of the circulatory system: Secondary | ICD-10-CM

## 2021-12-13 DIAGNOSIS — R0789 Other chest pain: Secondary | ICD-10-CM | POA: Insufficient documentation

## 2021-12-13 LAB — BASIC METABOLIC PANEL
Anion gap: 9 (ref 5–15)
BUN: 10 mg/dL (ref 6–20)
CO2: 25 mmol/L (ref 22–32)
Calcium: 9.2 mg/dL (ref 8.9–10.3)
Chloride: 104 mmol/L (ref 98–111)
Creatinine, Ser: 0.67 mg/dL (ref 0.44–1.00)
GFR, Estimated: >60 mL/min (ref >60)
Glucose, Bld: 96 mg/dL (ref 70–99)
Potassium: 3.3 mmol/L — ABNORMAL LOW (ref 3.5–5.1)
Sodium: 138 mmol/L (ref 135–145)

## 2021-12-13 LAB — I-STAT BETA HCG BLOOD, ED (MC, WL, AP ONLY): I-stat hCG, quantitative: <5 m[IU]/mL (ref <5)

## 2021-12-13 LAB — CBC
HCT: 39 % (ref 36.0–46.0)
Hemoglobin: 13.1 g/dL (ref 12.0–15.0)
MCH: 30.4 pg (ref 26.0–34.0)
MCHC: 33.6 g/dL (ref 30.0–36.0)
MCV: 90.5 fL (ref 80.0–100.0)
Platelets: 252 10*3/uL (ref 150–400)
RBC: 4.31 MIL/uL (ref 3.87–5.11)
RDW: 12.5 % (ref 11.5–15.5)
WBC: 10.8 10*3/uL — ABNORMAL HIGH (ref 4.0–10.5)
nRBC: 0 % (ref 0.0–0.2)

## 2021-12-13 LAB — TROPONIN I (HIGH SENSITIVITY)
Troponin I (High Sensitivity): <2 ng/L (ref <18)
Troponin I (High Sensitivity): <2 ng/L (ref <18)

## 2021-12-13 NOTE — ED Notes (Signed)
Pt stated that she is not waiting any longer

## 2021-12-13 NOTE — ED Triage Notes (Signed)
Pt c/o left side chest pain that she feels in her back that radiates down left arm.

## 2021-12-13 NOTE — ED Provider Notes (Signed)
Renaldo Fiddler    CSN: [id number] Arrival date & time: 12/13/21  1809      History   Chief Complaint Chief Complaint  Patient presents with   Chest Pain    HPI Keshea Camaj Slappey is a 41 y.o. female presenting with left-sided chest pain that radiates to back and down arm. Symptoms x15 hours. Medical history hypothyroid, asthma, heart murmur. Describes this as constant pressure. Denies increase in pain with movement or inspiration. Also with indigestion. Denies SOB, palpitations, headaches, vision changes.  HPI  Past Medical History:  Diagnosis Date   Allergic rhinitis    Anxiety    Asthma    Colitis    Collagenous colitis 02/25/2015   02/2015 colonoscopy   Depression    Heart murmur    past hx of but not now    Hypothyroidism    IBS (irritable bowel syndrome)    Kidney stones    Meningitis    age 66 -   Scarlet fever    Shingles     Patient Active Problem List   Diagnosis Date Noted   Bilateral nephrolithiasis 10/23/2019   IUD (intrauterine device) in place 10/23/2019   Collagenous colitis 02/25/2015    Past Surgical History:  Procedure Laterality Date   COLONOSCOPY     LITHOTRIPSY     TONSILLECTOMY     WISDOM TOOTH EXTRACTION      OB History     Gravida  3   Para  1   Term  1   Preterm      AB  2   Living  1      SAB  1   IAB  1   Ectopic      Multiple      Live Births               Home Medications    Prior to Admission medications   Medication Sig Start Date End Date Taking? Authorizing Provider  budesonide (ENTOCORT EC) 3 MG 24 hr capsule TAKE 3 CAPSULES BY MOUTH DAILY FOR 3 WEEKS, THEN 2 CAPSULES DAILY FOR 1 MONTH, THEN 1 CAPSULE DAILY FOR 1 MONTH. 04/05/20   Esterwood, Amy S, PA-C  cephALEXin (KEFLEX) 500 MG capsule Take 1 capsule (500 mg total) by mouth 4 (four) times daily. 03/11/21   Mickie Bail, NP  cetirizine (ZYRTEC) 10 MG tablet Take 10 mg by mouth daily.    [provider]  DULoxetine (CYMBALTA) 30  MG capsule Take 30 mg by mouth daily.    [provider]  fluticasone (FLONASE) 50 MCG/ACT nasal spray Place 2 sprays into both nostrils daily.    [provider]  glycopyrrolate (ROBINUL) 2 MG tablet 1 tablet by mouth 1-2 times daily for diarrhea/ abdominal cramping 02/11/20   Esterwood, Amy S, PA-C  lamoTRIgine (LAMICTAL) 25 MG tablet Take 25 mg by mouth every evening.  12/02/14   [provider]  levonorgestrel (MIRENA, 52 MG,) 20 MCG/24HR IUD 1 each by Intrauterine route once.    [provider]  levothyroxine (SYNTHROID, LEVOTHROID) 75 MCG tablet Take 75 mcg by mouth daily before breakfast.  04/04/15   [provider]  ondansetron (ZOFRAN ODT) 8 MG disintegrating tablet Take 1 tablet (8 mg total) by mouth every 6 (six) hours as needed for nausea or vomiting. 03/23/20   Napoleon Form, MD  promethazine (PHENERGAN) 25 MG tablet TAKE 1 TABLET BY MOUTH DAILY 11/30/20   Napoleon Form, MD  topiramate (TOPAMAX) 25 MG tablet Take 50 mg by mouth 2 (two) times daily. 03/01/20   [provider]    Family History Family History  Problem Relation Age of Onset   Hypertension Mother    Hypertension Father    Liver cancer Father    Alcohol abuse Father    Cirrhosis Father    Breast cancer Paternal Aunt    Uterine cancer Sister    Colon cancer Neg Hx    Esophageal cancer Neg Hx    Rectal cancer Neg Hx     Social History Social History   Tobacco Use   Smoking status: Never   Smokeless tobacco: Never  Vaping Use   Vaping Use: Never used  Substance Use Topics   Alcohol use: Yes    Alcohol/week: 0.0 standard drinks    Comment: rare   Drug use: No     Allergies   Doxycycline, Elemental sulfur, Morphine and related, Vicodin [hydrocodone-acetaminophen], and Compazine [prochlorperazine]   Review of Systems Review of Systems  Cardiovascular:  Positive for chest pain.  All other systems reviewed and are negative.   Physical  Exam Triage Vital Signs ED Triage Vitals  Enc Vitals Group     BP 12/13/21 1827 (!) 150/90     Pulse Rate 12/13/21 1827 72     Resp 12/13/21 1827 18     Temp 12/13/21 1827 98.9 F (37.2 C)     Temp Source 12/13/21 1827 Oral     SpO2 12/13/21 1827 98 %     Weight --      Height --      Head Circumference --      Peak Flow --      Pain Score 12/13/21 1826 5     Pain Loc --      Pain Edu? --      Excl. in GC? --    No data found.  Updated Vital Signs BP (!) 150/90 (BP Location: Left Arm)    Pulse 72    Temp 98.9 F (37.2 C) (Oral)    Resp 18    SpO2 98%   Visual Acuity Right Eye Distance:   Left Eye Distance:   Bilateral Distance:    Right Eye Near:   Left Eye Near:    Bilateral Near:     Physical Exam Vitals reviewed.  Constitutional:      Appearance: Normal appearance. She is not diaphoretic.  HENT:     Head: Normocephalic and atraumatic.     Mouth/Throat:     Mouth: Mucous membranes are moist.  Eyes:     Extraocular Movements: Extraocular movements intact.     Pupils: Pupils are equal, round, and reactive to light.  Cardiovascular:     Rate and Rhythm: Normal rate and regular rhythm.     Pulses:          Radial pulses are 2+ on the right side and 2+ on the left side.     Heart sounds: Normal heart sounds.  Pulmonary:     Effort: Pulmonary effort is normal.     Breath sounds: Normal breath sounds.  Chest:     Chest wall: Tenderness present.     Comments: Left sided anterior chest wall tenderness to palpation  Abdominal:     Palpations: Abdomen is soft.     Tenderness: There is no abdominal tenderness. There is no guarding or rebound.  Musculoskeletal:     Right lower leg: No edema.  Left lower leg: No edema.  Skin:    General: Skin is warm.     Capillary Refill: Capillary refill takes less than 2 seconds.  Neurological:     General: No focal deficit present.     Mental Status: She is alert and oriented to person, place, and time.  Psychiatric:         Mood and Affect: Mood normal.        Behavior: Behavior normal.        Thought Content: Thought content normal.        Judgment: Judgment normal.     UC Treatments / Results  Labs (all labs ordered are listed, but only abnormal results are displayed) Labs Reviewed - No data to display  EKG   Radiology No results found.  Procedures Procedures (including critical care time)  Medications Ordered in UC Medications - No data to display  Initial Impression / Assessment and Plan / UC Course  I have reviewed the triage vital signs and the nursing notes.  Pertinent labs & imaging results that were available during my care of the patient were reviewed by me and considered in my medical decision making (see chart for details).     This patient is a very pleasant 41 y.o. year old female presenting with atypical chest pain. nontachy.  EKG with ST changes . Appears changed in comparison with 12/03/19 EKG. Pain is reproducible, but given EKG changes, advised she head to ED for further cardiac evaluation. She is in agreement. Declines EMS in favor of transport by family member.    Final Clinical Impressions(s) / UC Diagnoses   Final diagnoses:  Atypical chest pain  Chest wall pain  History of cardiac murmur     Discharge Instructions      -Head to the ED for further evaluation of chest pain. If symptoms worsen on the way (chest pain, shortness of breath, dizziness) - stop and call 911.     ED Prescriptions   None    PDMP not reviewed this encounter.   Hazel Sams, PA-C 12/13/21 1845

## 2021-12-13 NOTE — ED Triage Notes (Signed)
Pt c/o sudden-onset stabbing L sided CP beginning approx 0300, radiation to L hand/fingers, subsided to "soreness," seen at [facility] for same, sent to ED for "change in EKG." Worse w movement, states "holding pressure on chest" helps pain

## 2021-12-13 NOTE — ED Provider Triage Note (Signed)
Emergency Medicine Provider Triage Evaluation Note  Alison Clarke , a 41 y.o. female  was evaluated in triage.  Pt complains of left-sided chest pain starting about 3 PM this evening.  He states that it radiates to her left hand and fingers, he initially described it as stabbing through to her back but overall subsided to "soreness".  She was seen in urgent care for the same, and sent to the emergency department for "changes in her EKG".  She states that her pain is worse with movement, and feels as though holding pressure on her chest helps relieve the pain.  She does have history of colitis, did have a severe episode yesterday of diarrhea so thinks that she might be dehydrated.  Review of Systems  Positive: Chest pain, shortness of breath, weakness Negative: Fever, chills, cough, abdominal pain  Physical Exam  BP 138/88 (BP Location: Right Arm)    Pulse 75    Temp (!) 97.5 F (36.4 C) (Oral)    Resp 18    SpO2 100%  Gen:   Awake, no distress   Resp:  Normal effort  MSK:   Moves extremities without difficulty  Other:    Medical Decision Making  Medically screening exam initiated at 7:40 PM.  Appropriate orders placed.  Alison Clarke was informed that the remainder of the evaluation will be completed by another provider, this initial triage assessment does not replace that evaluation, and the importance of remaining in the ED until their evaluation is complete.     Kateri Plummer, Hershal Coria 12/13/21 1949

## 2021-12-13 NOTE — Discharge Instructions (Addendum)
-  Head to the ED for further evaluation of chest pain. If symptoms worsen on the way (chest pain, shortness of breath, dizziness) - stop and call 911.

## 2022-01-12 ENCOUNTER — Ambulatory Visit: Payer: Commercial Managed Care - PPO | Admitting: Obstetrics and Gynecology

## 2022-02-02 ENCOUNTER — Ambulatory Visit: Payer: Commercial Managed Care - PPO | Admitting: Advanced Practice Midwife

## 2022-04-21 ENCOUNTER — Other Ambulatory Visit: Payer: Self-pay | Admitting: Family Medicine

## 2022-04-21 DIAGNOSIS — Z1231 Encounter for screening mammogram for malignant neoplasm of breast: Secondary | ICD-10-CM

## 2022-06-22 ENCOUNTER — Ambulatory Visit
Admission: RE | Admit: 2022-06-22 | Discharge: 2022-06-22 | Disposition: A | Discharge status: Home / Self Care | Payer: BC Managed Care – PPO | Source: Ambulatory Visit | Attending: Family Medicine | Admitting: Family Medicine

## 2022-06-22 DIAGNOSIS — Z1231 Encounter for screening mammogram for malignant neoplasm of breast: Secondary | ICD-10-CM | POA: Diagnosis present

## 2022-06-23 ENCOUNTER — Other Ambulatory Visit: Payer: Self-pay | Admitting: Family Medicine

## 2022-06-29 ENCOUNTER — Emergency Department (HOSPITAL_BASED_OUTPATIENT_CLINIC_OR_DEPARTMENT_OTHER)
Admission: EM | Admit: 2022-06-29 | Discharge: 2022-06-29 | Disposition: A | Discharge status: Home / Self Care | Payer: BC Managed Care – PPO | Attending: Emergency Medicine | Admitting: Emergency Medicine

## 2022-06-29 ENCOUNTER — Encounter (HOSPITAL_BASED_OUTPATIENT_CLINIC_OR_DEPARTMENT_OTHER): Payer: Self-pay | Admitting: Emergency Medicine

## 2022-06-29 ENCOUNTER — Other Ambulatory Visit: Payer: Self-pay

## 2022-06-29 DIAGNOSIS — H5712 Ocular pain, left eye: Secondary | ICD-10-CM | POA: Insufficient documentation

## 2022-06-29 DIAGNOSIS — R519 Headache, unspecified: Secondary | ICD-10-CM | POA: Insufficient documentation

## 2022-06-29 MED ORDER — DROPERIDOL 2.5 MG/ML IJ SOLN
1.2500 mg | Freq: Once | INTRAMUSCULAR | Status: AC
  Administered 2022-06-29: 1.25 mg via INTRAVENOUS
  Filled 2022-06-29: qty 2

## 2022-06-29 MED ORDER — DIPHENHYDRAMINE HCL 50 MG/ML IJ SOLN
25.0000 mg | Freq: Once | INTRAMUSCULAR | Status: AC
  Administered 2022-06-29: 25 mg via INTRAVENOUS
  Filled 2022-06-29: qty 1

## 2022-06-29 MED ORDER — DIPHENHYDRAMINE HCL 50 MG/ML IJ SOLN
25.0000 mg | Freq: Once | INTRAMUSCULAR | Status: DC
  Filled 2022-06-29: qty 1

## 2022-06-29 MED ORDER — DEXAMETHASONE 4 MG PO TABS
10.0000 mg | ORAL_TABLET | Freq: Once | ORAL | Status: AC
  Administered 2022-06-29: 10 mg via ORAL
  Filled 2022-06-29: qty 3

## 2022-06-29 MED ORDER — TETRACAINE HCL 0.5 % OP SOLN
2.0000 [drp] | Freq: Once | OPHTHALMIC | Status: AC
  Administered 2022-06-29: 2 [drp] via OPHTHALMIC
  Filled 2022-06-29: qty 4

## 2022-06-29 MED ORDER — FLUORESCEIN SODIUM 1 MG OP STRP
1.0000 | ORAL_STRIP | Freq: Once | OPHTHALMIC | Status: AC
  Administered 2022-06-29: 1 via OPHTHALMIC
  Filled 2022-06-29: qty 1

## 2022-06-29 MED ORDER — DIPHENHYDRAMINE HCL 50 MG/ML IJ SOLN
25.0000 mg | Freq: Once | INTRAMUSCULAR | Status: AC
  Administered 2022-06-29: 25 mg via INTRAVENOUS

## 2022-06-29 NOTE — ED Provider Notes (Signed)
[facility] EMERGENCY DEPARTMENT Provider Note   CSN: [id number] Arrival date & time: 06/29/22  1752     History  Chief Complaint  Patient presents with   Headache    Alison Clarke is a 41 y.o. female.  41 yo F with a chief complaint of left-sided headache left eye pain left eye twitching.  Is been going on for about 3 to 4 days.  She also felt spontaneously dizzy for a few seconds which resolved.  She called her neurologist but it sounds like there be a couple months delay to be seen.  She went to her family doctor's office today and she suggested to come to the emergency department for evaluation.  She fell about 3 to 4 days ago but denies any injury to the head denies loss of consciousness denies nausea or vomiting.  Denies one-sided numbness or weakness denies difficulty speech or swallowing.  Has a history of migraines but this feels different.   Headache      Home Medications Prior to Admission medications   Medication Sig Start Date End Date Taking? Authorizing Provider  budesonide (ENTOCORT EC) 3 MG 24 hr capsule TAKE 3 CAPSULES BY MOUTH DAILY FOR 3 WEEKS, THEN 2 CAPSULES DAILY FOR 1 MONTH, THEN 1 CAPSULE DAILY FOR 1 MONTH. 04/05/20   Esterwood, Amy S, PA-C  cephALEXin (KEFLEX) 500 MG capsule Take 1 capsule (500 mg total) by mouth 4 (four) times daily. 03/11/21   Mickie Bail, NP  cetirizine (ZYRTEC) 10 MG tablet Take 10 mg by mouth daily.    [provider]  DULoxetine (CYMBALTA) 30 MG capsule Take 30 mg by mouth daily.    [provider]  fluticasone (FLONASE) 50 MCG/ACT nasal spray Place 2 sprays into both nostrils daily.    [provider]  glycopyrrolate (ROBINUL) 2 MG tablet 1 tablet by mouth 1-2 times daily for diarrhea/ abdominal cramping 02/11/20   Esterwood, Amy S, PA-C  lamoTRIgine (LAMICTAL) 25 MG tablet Take 25 mg by mouth every evening.  12/02/14   [provider]  levonorgestrel (MIRENA, 52 MG,) 20 MCG/24HR IUD 1  each by Intrauterine route once.    [provider]  levothyroxine (SYNTHROID, LEVOTHROID) 75 MCG tablet Take 75 mcg by mouth daily before breakfast.  04/04/15   [provider]  ondansetron (ZOFRAN ODT) 8 MG disintegrating tablet Take 1 tablet (8 mg total) by mouth every 6 (six) hours as needed for nausea or vomiting. 03/23/20   Nandigam, Eleonore Chiquito, MD  promethazine (PHENERGAN) 25 MG tablet TAKE 1 TABLET BY MOUTH DAILY 11/30/20   Napoleon Form, MD  topiramate (TOPAMAX) 25 MG tablet Take 50 mg by mouth 2 (two) times daily. 03/01/20   [provider]      Allergies    Doxycycline, Elemental sulfur, Morphine and related, Vicodin [hydrocodone-acetaminophen], and Compazine [prochlorperazine]    Review of Systems   Review of Systems  Neurological:  Positive for headaches.    Physical Exam Updated Vital Signs BP 134/79 (BP Location: Left Arm)   Pulse 83   Temp 97.9 F (36.6 C) (Oral)   Resp 18   SpO2 99%  Physical Exam Vitals and nursing note reviewed.  Constitutional:      General: She is not in acute distress.    Appearance: She is well-developed. She is not diaphoretic.  HENT:     Head: Normocephalic and atraumatic.  Eyes:     Pupils: Pupils are equal, round, and reactive to  light.     Comments: No fluorescein uptake on the left eye, pressure 13.  Cardiovascular:     Rate and Rhythm: Normal rate and regular rhythm.     Heart sounds: No murmur heard.    No friction rub. No gallop.  Pulmonary:     Effort: Pulmonary effort is normal.     Breath sounds: No wheezing or rales.  Abdominal:     General: There is no distension.     Palpations: Abdomen is soft.     Tenderness: There is no abdominal tenderness.  Musculoskeletal:        General: No tenderness.     Cervical back: Normal range of motion and neck supple.  Skin:    General: Skin is warm and dry.  Neurological:     Mental Status: She is alert and oriented to person, place, and time.      GCS: GCS eye subscore is 4. GCS verbal subscore is 5. GCS motor subscore is 6.     Cranial Nerves: Cranial nerves 2-12 are intact.     Sensory: Sensation is intact.     Motor: Motor function is intact.     Coordination: Coordination is intact.     Comments: Benign neurologic exam.  Psychiatric:        Behavior: Behavior normal.     ED Results / Procedures / Treatments   Labs (all labs ordered are listed, but only abnormal results are displayed) Labs Reviewed - No data to display  EKG None  Radiology No results found.  Procedures Procedures    Medications Ordered in ED Medications  diphenhydrAMINE (BENADRYL) injection 25 mg (has no administration in time range)  droperidol (INAPSINE) 2.5 MG/ML injection 1.25 mg (1.25 mg Intravenous Given 06/29/22 1937)  fluorescein ophthalmic strip 1 strip (1 strip Left Eye Given 06/29/22 1901)  tetracaine (PONTOCAINE) 0.5 % ophthalmic solution 2 drop (2 drops Left Eye Given 06/29/22 1901)  diphenhydrAMINE (BENADRYL) injection 25 mg (25 mg Intravenous Given 06/29/22 1900)  dexamethasone (DECADRON) tablet 10 mg (10 mg Oral Given 06/29/22 2019)    ED Course/ Medical Decision Making/ A&P                           Medical Decision Making Risk Prescription drug management.   41 yo F with a chief complaints of left eye pain and left-sided headache.  I have trouble using history to figure out if it is more the eye or the head causing her issues.  Left eye appears normal on gross exam.  Pupils equal round and reactive.  Extraocular motion is intact.  We will stain and check the pressure.  We will give a headache cocktail.  She has a benign neurologic exam. Feel imaging to be unlikely to be of benefit.    Patient is feeling a bit better after the headache cocktail.  She is having some mild akathisia.  We will give a second dose of Benadryl.  Given follow-up for neurology and ophthalmology.  8:20 PM:  I have discussed the diagnosis/risks/treatment  options with the patient and family.  Evaluation and diagnostic testing in the emergency department does not suggest an emergent condition requiring admission or immediate intervention beyond what has been performed at this time.  They will follow up with  PCP, neuro, optho. We also discussed returning to the ED immediately if new or worsening sx occur. We discussed the sx which are most concerning (e.g., sudden worsening  pain, fever, inability to tolerate by mouth) that necessitate immediate return. Medications administered to the patient during their visit and any new prescriptions provided to the patient are listed below.  Medications given during this visit Medications  diphenhydrAMINE (BENADRYL) injection 25 mg (has no administration in time range)  droperidol (INAPSINE) 2.5 MG/ML injection 1.25 mg (1.25 mg Intravenous Given 06/29/22 1937)  fluorescein ophthalmic strip 1 strip (1 strip Left Eye Given 06/29/22 1901)  tetracaine (PONTOCAINE) 0.5 % ophthalmic solution 2 drop (2 drops Left Eye Given 06/29/22 1901)  diphenhydrAMINE (BENADRYL) injection 25 mg (25 mg Intravenous Given 06/29/22 1900)  dexamethasone (DECADRON) tablet 10 mg (10 mg Oral Given 06/29/22 2019)     The patient appears reasonably screen and/or stabilized for discharge and I doubt any other medical condition or other [facility] requiring further screening, evaluation, or treatment in the ED at this time prior to discharge.           Final Clinical Impression(s) / ED Diagnoses Final diagnoses:  Left eye pain  Left-sided headache    Rx / DC Orders ED Discharge Orders          Ordered    Ambulatory referral to Neurology       Comments: New headache syndrome   06/29/22 2012              Melene Plan, DO 06/29/22 2020

## 2022-06-29 NOTE — ED Notes (Signed)
Dc instructions reviewed with pt no questions or concerns at this time. Will follow up with neuro and opthalmology tomorrow.

## 2022-06-29 NOTE — ED Triage Notes (Addendum)
Patient presents to ED via POV from home. Here with left eye pain since Friday. Reports "pulsating sensation". Also endorses headache.

## 2022-06-29 NOTE — Discharge Instructions (Addendum)
Please follow up with the neurologist and ophthalmologist in the office.  Please return for worsening headache one-sided numbness or weakness difficulty speech or swallowing.

## 2022-07-06 ENCOUNTER — Inpatient Hospital Stay: Payer: BC Managed Care – PPO | Admitting: Psychiatry

## 2022-07-06 ENCOUNTER — Other Ambulatory Visit: Payer: Self-pay | Admitting: Adult Health

## 2022-07-06 DIAGNOSIS — R928 Other abnormal and inconclusive findings on diagnostic imaging of breast: Secondary | ICD-10-CM

## 2022-07-06 DIAGNOSIS — N63 Unspecified lump in unspecified breast: Secondary | ICD-10-CM

## 2022-07-19 ENCOUNTER — Ambulatory Visit
Admission: RE | Admit: 2022-07-19 | Discharge: 2022-07-19 | Disposition: A | Discharge status: Home / Self Care | Payer: BC Managed Care – PPO | Source: Ambulatory Visit | Attending: Adult Health | Admitting: Adult Health

## 2022-07-19 DIAGNOSIS — R928 Other abnormal and inconclusive findings on diagnostic imaging of breast: Secondary | ICD-10-CM | POA: Diagnosis not present

## 2022-07-19 DIAGNOSIS — N63 Unspecified lump in unspecified breast: Secondary | ICD-10-CM | POA: Diagnosis present

## 2022-08-07 ENCOUNTER — Inpatient Hospital Stay: Payer: BC Managed Care – PPO | Admitting: Psychiatry

## 2022-08-10 DIAGNOSIS — G43709 Chronic migraine without aura, not intractable, without status migrainosus: Secondary | ICD-10-CM | POA: Insufficient documentation

## 2022-08-29 ENCOUNTER — Inpatient Hospital Stay: Payer: BC Managed Care – PPO | Admitting: Psychiatry

## 2022-08-29 ENCOUNTER — Encounter: Payer: Self-pay | Admitting: Psychiatry

## 2022-08-29 NOTE — Progress Notes (Deleted)
Referring:  Deno Etienne, DO [place],  [place] [id number]  PCP: Remi Haggard, [place]  Neurology was asked to evaluate Alison Clarke, a 41 year old female for a chief complaint of headaches.  Our recommendations of care will be communicated by shared medical record.    CC:  headaches  History provided from ***  HPI:  Medical co-morbidities: ***  The patient presents for evaluation of headaches which began***  She recently saw Neurology at [facility] on 08/10/22, who started her on Ajovy for migraine prevention. MRI brain 08/22/22 was unremarkable.  Headache History: Onset: Triggers: Aura: Location: Quality/Description: Associated Symptoms:  Photophobia:  Phonophobia:  Nausea: Vomiting: Allodynia: Other symptoms: Worse with activity?: Duration of headaches:  Headache days per month: *** Migraine days per month: *** Headache free days per month: ***  Current Treatment: Abortive ***  Preventative ***  Prior Therapies                                 Ajovy Maxalt 10 mg PRN Vistaril 25 mg PRN  LABS: ***  IMAGING:  ***  ***Imaging independently reviewed on August 29, 2022   Current Outpatient Medications on File Prior to Visit  Medication Sig Dispense Refill   budesonide (ENTOCORT EC) 3 MG 24 hr capsule TAKE 3 CAPSULES BY MOUTH DAILY FOR 3 WEEKS, THEN 2 CAPSULES DAILY FOR 1 MONTH, THEN 1 CAPSULE DAILY FOR 1 MONTH. 180 capsule 0   cephALEXin (KEFLEX) 500 MG capsule Take 1 capsule (500 mg total) by mouth 4 (four) times daily. 28 capsule 0   cetirizine (ZYRTEC) 10 MG tablet Take 10 mg by mouth daily.     DULoxetine (CYMBALTA) 30 MG capsule Take 30 mg by mouth daily.     fluticasone (FLONASE) 50 MCG/ACT nasal spray Place 2 sprays into both nostrils daily.     glycopyrrolate (ROBINUL) 2 MG tablet 1 tablet by mouth 1-2 times daily for diarrhea/ abdominal cramping 60 tablet 3   lamoTRIgine (LAMICTAL) 25 MG tablet Take 25 mg by mouth every evening.   1    levonorgestrel (MIRENA, 52 MG,) 20 MCG/24HR IUD 1 each by Intrauterine route once.     levothyroxine (SYNTHROID, LEVOTHROID) 75 MCG tablet Take 75 mcg by mouth daily before breakfast.   0   ondansetron (ZOFRAN ODT) 8 MG disintegrating tablet Take 1 tablet (8 mg total) by mouth every 6 (six) hours as needed for nausea or vomiting. 40 tablet 1   promethazine (PHENERGAN) 25 MG tablet TAKE 1 TABLET BY MOUTH DAILY 30 tablet 1   topiramate (TOPAMAX) 25 MG tablet Take 50 mg by mouth 2 (two) times daily.     No current facility-administered medications on file prior to visit.     Allergies: Allergies  Allergen Reactions   Doxycycline Nausea And Vomiting   Elemental Sulfur Hives    Swell, rash, burns skin   Morphine And Related Nausea And Vomiting   Vicodin [Hydrocodone-Acetaminophen] Nausea And Vomiting   Compazine [Prochlorperazine] Anxiety    Pt gets restless    Family History: Migraine or other headaches in the family:  *** Aneurysms in a first degree relative:  *** Brain tumors in the family:  *** Other neurological illness in the family:   ***  Past Medical History: Past Medical History:  Diagnosis Date   Allergic rhinitis    Anxiety    Asthma    Colitis  Collagenous colitis 02/25/2015   02/2015 colonoscopy   Depression    Heart murmur    past hx of but not now    Hypothyroidism    IBS (irritable bowel syndrome)    Kidney stones    Meningitis    age 72 -   Scarlet fever    Shingles     Past Surgical History Past Surgical History:  Procedure Laterality Date   COLONOSCOPY     LITHOTRIPSY     TONSILLECTOMY     WISDOM TOOTH EXTRACTION      Social History: Social History   Tobacco Use   Smoking status: Never   Smokeless tobacco: Never  Vaping Use   Vaping Use: Never used  Substance Use Topics   Alcohol use: Yes    Alcohol/week: 0.0 standard drinks of alcohol    Comment: rare   Drug use: No   ***  ROS: Negative for fevers, chills. Positive for***.  All other systems reviewed and negative unless stated otherwise in HPI.   Physical Exam:   Vital Signs: There were no vitals taken for this visit. GENERAL: well appearing,in no acute distress,alert SKIN:  Color, texture, turgor normal. No rashes or lesions HEAD:  Normocephalic/atraumatic. CV:  RRR RESP: Normal respiratory effort MSK: no tenderness to palpation over occiput, neck, or shoulders  NEUROLOGICAL: Mental Status: Alert, oriented to person, place and time,Follows commands Cranial Nerves: PERRL, visual fields intact to confrontation, extraocular movements intact, facial sensation intact, no facial droop or ptosis, hearing grossly intact, no dysarthria, palate elevate symmetrically, tongue protrudes midline, shoulder shrug intact and symmetric Motor: muscle strength 5/5 both upper and lower extremities,no drift, normal tone Reflexes: 2+ throughout Sensation: intact to light touch all 4 extremities Coordination: Finger-to- nose-finger intact bilaterally Gait: normal-based   IMPRESSION: ***  PLAN: ***   I spent a total of *** minutes chart reviewing and counseling the patient. Headache education was done. Discussed treatment options including preventive and acute medications, natural supplements, and physical therapy. Discussed medication overuse headache and to limit use of acute treatments to no more than 2 days/week or 10 days/month. Discussed medication side effects, adverse reactions and drug interactions. Written educational materials and patient instructions outlining all of the above were given.  Follow-up: ***   Genia Harold, MD 08/29/2022   9:02 AM

## 2022-11-30 ENCOUNTER — Ambulatory Visit: Payer: BC Managed Care – PPO | Admitting: Nurse Practitioner

## 2022-12-14 ENCOUNTER — Ambulatory Visit: Payer: BC Managed Care – PPO | Admitting: Nurse Practitioner

## 2022-12-19 ENCOUNTER — Ambulatory Visit: Payer: Self-pay | Admitting: Gastroenterology

## 2022-12-22 DIAGNOSIS — Z886 Allergy status to analgesic agent status: Secondary | ICD-10-CM | POA: Diagnosis not present

## 2022-12-22 DIAGNOSIS — Z885 Allergy status to narcotic agent status: Secondary | ICD-10-CM | POA: Diagnosis not present

## 2022-12-22 DIAGNOSIS — Z79890 Hormone replacement therapy (postmenopausal): Secondary | ICD-10-CM | POA: Diagnosis not present

## 2022-12-22 DIAGNOSIS — Z888 Allergy status to other drugs, medicaments and biological substances status: Secondary | ICD-10-CM | POA: Diagnosis not present

## 2022-12-22 DIAGNOSIS — E063 Autoimmune thyroiditis: Secondary | ICD-10-CM | POA: Diagnosis not present

## 2022-12-22 DIAGNOSIS — E039 Hypothyroidism, unspecified: Secondary | ICD-10-CM | POA: Diagnosis not present

## 2022-12-22 DIAGNOSIS — Z881 Allergy status to other antibiotic agents status: Secondary | ICD-10-CM | POA: Diagnosis not present

## 2023-01-19 ENCOUNTER — Ambulatory Visit: Payer: 59 | Admitting: Family

## 2023-03-05 ENCOUNTER — Other Ambulatory Visit: Payer: Self-pay | Admitting: Family Medicine

## 2023-03-05 ENCOUNTER — Ambulatory Visit: Payer: 59 | Admitting: Family

## 2023-03-05 DIAGNOSIS — F39 Unspecified mood [affective] disorder: Secondary | ICD-10-CM

## 2023-03-05 DIAGNOSIS — F411 Generalized anxiety disorder: Secondary | ICD-10-CM

## 2023-03-05 DIAGNOSIS — F422 Mixed obsessional thoughts and acts: Secondary | ICD-10-CM

## 2023-03-05 NOTE — Telephone Encounter (Signed)
Contacted pt to r/s new pt appt for today, 4/22. Pt stated she has been waiting 2 months for an appt with [facility]. Pt states she is now completely out of the meds, lamoTRIgine (LAMICTAL) 25 MG tablet. Pt stated the new pt appt she had before with Dugal, she gave it to her husband due to him needing BP meds. Pt is asking can her meds be refilled? Pt r/s to for 04/03/23. Preferred pharmacy is [facility] [id number] - [place], [place] - [address]

## 2023-03-05 NOTE — Telephone Encounter (Signed)
What is pt taking lamictal for?   I read via her last visit with the neurologist that they were giving her this for headache preventative. This would typically continue to be refilled by the neurologist, has she reached out to their office for a refill?

## 2023-03-05 NOTE — Telephone Encounter (Signed)
Spoke with the patient. She is taking this medication as a mood stabilizer. She says this is for her OCD and anxiety which she has been on for about 10 years. She will see her neurologist for her headache preventative soon. She was getting shots for that.

## 2023-03-06 ENCOUNTER — Encounter: Payer: Self-pay | Admitting: Family

## 2023-03-06 DIAGNOSIS — F422 Mixed obsessional thoughts and acts: Secondary | ICD-10-CM | POA: Insufficient documentation

## 2023-03-06 DIAGNOSIS — F39 Unspecified mood [affective] disorder: Secondary | ICD-10-CM | POA: Insufficient documentation

## 2023-03-06 DIAGNOSIS — F411 Generalized anxiety disorder: Secondary | ICD-10-CM | POA: Insufficient documentation

## 2023-03-06 MED ORDER — LAMOTRIGINE 25 MG PO TABS: 25.0000 mg | ORAL_TABLET | Freq: Every evening | ORAL | 3 refills | Status: DC

## 2023-03-13 ENCOUNTER — Ambulatory Visit: Payer: 59 | Admitting: Family

## 2023-04-03 ENCOUNTER — Encounter: Payer: Self-pay | Admitting: Family

## 2023-04-03 ENCOUNTER — Ambulatory Visit (INDEPENDENT_AMBULATORY_CARE_PROVIDER_SITE_OTHER): Payer: 59 | Admitting: Family

## 2023-04-03 VITALS — BP 122/74 | HR 96 | Temp 97.6°F | Ht 61.0 in | Wt 177.0 lb

## 2023-04-03 DIAGNOSIS — F422 Mixed obsessional thoughts and acts: Secondary | ICD-10-CM

## 2023-04-03 DIAGNOSIS — Z1231 Encounter for screening mammogram for malignant neoplasm of breast: Secondary | ICD-10-CM

## 2023-04-03 DIAGNOSIS — R41840 Attention and concentration deficit: Secondary | ICD-10-CM | POA: Diagnosis not present

## 2023-04-03 DIAGNOSIS — E559 Vitamin D deficiency, unspecified: Secondary | ICD-10-CM | POA: Diagnosis not present

## 2023-04-03 DIAGNOSIS — Z975 Presence of (intrauterine) contraceptive device: Secondary | ICD-10-CM

## 2023-04-03 DIAGNOSIS — K52831 Collagenous colitis: Secondary | ICD-10-CM

## 2023-04-03 DIAGNOSIS — E039 Hypothyroidism, unspecified: Secondary | ICD-10-CM | POA: Diagnosis not present

## 2023-04-03 DIAGNOSIS — F411 Generalized anxiety disorder: Secondary | ICD-10-CM

## 2023-04-03 DIAGNOSIS — F39 Unspecified mood [affective] disorder: Secondary | ICD-10-CM

## 2023-04-03 DIAGNOSIS — E538 Deficiency of other specified B group vitamins: Secondary | ICD-10-CM | POA: Diagnosis not present

## 2023-04-03 DIAGNOSIS — L80 Vitiligo: Secondary | ICD-10-CM | POA: Insufficient documentation

## 2023-04-03 DIAGNOSIS — Z13220 Encounter for screening for lipoid disorders: Secondary | ICD-10-CM | POA: Diagnosis not present

## 2023-04-03 DIAGNOSIS — E876 Hypokalemia: Secondary | ICD-10-CM | POA: Diagnosis not present

## 2023-04-03 DIAGNOSIS — R739 Hyperglycemia, unspecified: Secondary | ICD-10-CM | POA: Insufficient documentation

## 2023-04-03 DIAGNOSIS — Z1283 Encounter for screening for malignant neoplasm of skin: Secondary | ICD-10-CM

## 2023-04-03 DIAGNOSIS — J301 Allergic rhinitis due to pollen: Secondary | ICD-10-CM | POA: Insufficient documentation

## 2023-04-03 LAB — BASIC METABOLIC PANEL
BUN: 12 mg/dL (ref 6–23)
CO2: 28 mEq/L (ref 19–32)
Calcium: 9 mg/dL (ref 8.4–10.5)
Chloride: 103 mEq/L (ref 96–112)
Creatinine, Ser: 0.73 mg/dL (ref 0.40–1.20)
GFR: 100.14 mL/min (ref >60.00)
Glucose, Bld: 79 mg/dL (ref 70–99)
Potassium: 3.2 mEq/L — ABNORMAL LOW (ref 3.5–5.1)
Sodium: 140 mEq/L (ref 135–145)

## 2023-04-03 LAB — LIPID PANEL
Cholesterol: 175 mg/dL (ref 0–200)
HDL: 45.3 mg/dL (ref >39.00)
LDL Cholesterol: 112 mg/dL — ABNORMAL HIGH (ref 0–99)
NonHDL: 129.92
Total CHOL/HDL Ratio: 4
Triglycerides: 92 mg/dL (ref 0.0–149.0)
VLDL: 18.4 mg/dL (ref 0.0–40.0)

## 2023-04-03 LAB — TSH: TSH: 2.26 u[IU]/mL (ref 0.35–5.50)

## 2023-04-03 LAB — HEMOGLOBIN A1C: Hgb A1c MFr Bld: 5.3 % (ref 4.6–6.5)

## 2023-04-03 LAB — B12 AND FOLATE PANEL
Folate: 16.2 ng/mL (ref >5.9)
Vitamin B-12: 235 pg/mL (ref 211–911)

## 2023-04-03 LAB — VITAMIN D 25 HYDROXY (VIT D DEFICIENCY, FRACTURES): VITD: 16.86 ng/mL — ABNORMAL LOW (ref 30.00–100.00)

## 2023-04-03 NOTE — Patient Instructions (Addendum)
  Stop zyrtec  Start xyzal  Continue flonase   Welcome to our clinic, I am happy to have you as my new patient. I am excited to continue on this healthcare journey with you.  ------------------------------------  A referral was placed today for psychiatry  Please let us know if you have not heard back within 2 weeks about the referral.  ------------------------------------ I have sent an electronic order over to your preferred location for the following:  It has to be after 07/20/23.   []   2D Mammogram  [x]   3D Mammogram  []   Bone Density   Please give this center a call to get scheduled at your convenience.  [x]   [facility]  [address] [place] [address]  [phone]  Make sure to wear two piece  clothing  No lotions powders or deodorants the day of the appointment Make sure to bring picture ID and insurance card.  Bring list of medications you are currently taking including any supplements.   ------------------------------------  Stop by the lab prior to leaving today. I will notify you of your results once received.   Please keep in mind Any my chart messages you send have up to a three business day turnaround for a response.  Phone calls may take up to a one full business day turnaround for a  response.   If you need a medication refill I recommend you request it through the pharmacy as this is easiest for [place] rather than sending a message and or phone call.   Due to recent changes in healthcare laws, you may see results of your imaging and/or laboratory studies on MyChart before I have had a chance to review them.  I understand that in some cases there may be results that are confusing or concerning to you. Please understand that not all results are received at the same time and often I may need to interpret multiple results in order to provide you with the best plan of care or course of treatment.  Therefore, I ask that you please give me 2 business days to thoroughly review all your results before contacting my office for clarification. Should we see a critical lab result, you will be contacted sooner.   It was a pleasure seeing you today! Please do not hesitate to reach out with any questions and or concerns.  Regards,   Mort Sawyers FNP-C

## 2023-04-03 NOTE — Progress Notes (Signed)
New Patient Office Visit  Subjective:  Patient ID: Alison Clarke, female    DOB: 1981-10-22  Age: 42 y.o. MRN: [id number]  CC: No chief complaint on file.   HPI Alison Clarke is here to establish care as a new patient.  Oriented to practice routines and expectations.  Prior provider was: preferred family practice   Pt is without acute concerns.   Pap: overdue will make appt with gyn to have mirena taken out  Tdap: pt declines today    chronic concerns:  GI doctor : Dr. Lavon Paganini, zofran prn   OCD anxiety: doing well on lamictal 25 mg once daily in the evening and has been on this for about ten years.   Mirena in place, was going to [employer] [place].   Hypothyroid: on levothyroxine 75 mcg once daily. Had a thyroid nodule last year .   Vitamin d def: not currently supplementing   ROS: Negative unless specifically indicated above in HPI.   Current Outpatient Medications:    cetirizine (ZYRTEC) 10 MG tablet, Take 10 mg by mouth daily., Disp: , Rfl:    fluticasone (FLONASE) 50 MCG/ACT nasal spray, Place 2 sprays into both nostrils daily., Disp: , Rfl:    lamoTRIgine (LAMICTAL) 25 MG tablet, Take 1 tablet (25 mg total) by mouth every evening., Disp: 90 tablet, Rfl: 3   levonorgestrel (MIRENA, 52 MG,) 20 MCG/24HR IUD, 1 each by Intrauterine route once., Disp: , Rfl:    levothyroxine (SYNTHROID, LEVOTHROID) 75 MCG tablet, Take 75 mcg by mouth daily before breakfast. , Disp: , Rfl: 0   ondansetron (ZOFRAN ODT) 8 MG disintegrating tablet, Take 1 tablet (8 mg total) by mouth every 6 (six) hours as needed for nausea or vomiting., Disp: 40 tablet, Rfl: 1   promethazine (PHENERGAN) 25 MG tablet, TAKE 1 TABLET BY MOUTH DAILY, Disp: 30 tablet, Rfl: 1 Past Medical History:  Diagnosis Date   Allergic rhinitis    Anxiety    Asthma    Colitis    Collagenous colitis 02/25/2015   02/2015 colonoscopy   Depression    Heart murmur    past hx of but not now    Hypothyroidism     IBS (irritable bowel syndrome)    Kidney stones    Meningitis    age 35 -   Scarlet fever    Shingles    Past Surgical History:  Procedure Laterality Date   COLONOSCOPY     LITHOTRIPSY     TONSILLECTOMY     WISDOM TOOTH EXTRACTION      Objective:   Today's Vitals: BP 122/74 (BP Location: Right Arm)   Pulse 96   Temp 97.6 F (36.4 C) (Temporal)   Ht 5\' 1"  (1.549 m)   Wt 177 lb (80.3 kg)   SpO2 99%   BMI 33.44 kg/m   Physical Exam Constitutional:      General: She is not in acute distress.    Appearance: Normal appearance. She is normal weight. She is not ill-appearing, toxic-appearing or diaphoretic.  HENT:     Head: Normocephalic.     Right Ear: Hearing, ear canal and external ear normal. A middle ear effusion is present.     Left Ear: Hearing, ear canal and external ear normal. A middle ear effusion is present.  Cardiovascular:     Rate and Rhythm: Normal rate and regular rhythm.  Pulmonary:     Effort: Pulmonary effort is normal.     Breath sounds: Normal  breath sounds.  Musculoskeletal:        General: Normal range of motion.  Skin:    Comments: Bil hypopigmentation   Neurological:     General: No focal deficit present.     Mental Status: She is alert and oriented to person, place, and time. Mental status is at baseline.  Psychiatric:        Attention and Perception: Attention normal.        Mood and Affect: Mood normal.        Speech: Speech is rapid and pressured.        Behavior: Behavior normal. Behavior is cooperative.        Thought Content: Thought content normal. Thought content does not include homicidal or suicidal plan.        Cognition and Memory: Cognition and memory normal.        Judgment: Judgment normal.     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  Continue levothyroxine 75 mcg once daily.   Orders: -     TSH  GAD (generalized anxiety disorder) Assessment & Plan: Stable   Continue lamictal 25 mg once daily    Concentration deficit Assessment & Plan: Referral placed for psychiatry  Eval/treat  Mood disorder vs adhd vs anxiety    Vitamin D deficiency -     VITAMIN D 25 Hydroxy (Vit-D Deficiency, Fractures)  Hypokalemia -     Basic metabolic panel  Low serum vitamin B12 -     B12 and Folate Panel  Hyperglycemia -     Hemoglobin A1c  Screening for lipoid disorders -     Lipid panel  Seasonal allergic rhinitis due to pollen  IUD (intrauterine device) in place Assessment & Plan: Overdue for removal pt to f/u with gyn to remove.  Also due for pap pt states will make appt   Vitiligo -     Ambulatory referral to Dermatology  Collagenous colitis Assessment & Plan: Overdue for GI appointment pt will call and make appt.    Screening mammogram for breast cancer -     3D Screening Mammogram, Left and Right; Future  Screening for skin cancer -     Ambulatory referral to Dermatology    Follow-up: Return for f/u CPE.   Mort Sawyers, FNP

## 2023-04-03 NOTE — Assessment & Plan Note (Signed)
Referral placed for psychiatry  Eval/treat  Mood disorder vs adhd vs anxiety

## 2023-04-03 NOTE — Assessment & Plan Note (Signed)
Continue f/u with endocrinology  Dx with hashimotos in the past.  Continue levothyroxine 75 mcg once daily.

## 2023-04-03 NOTE — Assessment & Plan Note (Signed)
Overdue for GI appointment pt will call and make appt.

## 2023-04-03 NOTE — Assessment & Plan Note (Signed)
Overdue for removal pt to f/u with gyn to remove.  Also due for pap pt states will make appt

## 2023-04-03 NOTE — Assessment & Plan Note (Signed)
Stable   Continue lamictal 25 mg once daily

## 2023-04-04 ENCOUNTER — Other Ambulatory Visit: Payer: Self-pay | Admitting: Family

## 2023-04-04 ENCOUNTER — Encounter: Payer: Self-pay | Admitting: Family

## 2023-04-04 DIAGNOSIS — E876 Hypokalemia: Secondary | ICD-10-CM

## 2023-04-04 DIAGNOSIS — R110 Nausea: Secondary | ICD-10-CM

## 2023-04-04 MED ORDER — POTASSIUM CHLORIDE CRYS ER 10 MEQ PO TBCR
10.0000 meq | EXTENDED_RELEASE_TABLET | Freq: Every day | ORAL | 0 refills | Status: DC
Start: 2023-04-04 — End: 2023-08-23

## 2023-04-05 MED ORDER — ONDANSETRON 8 MG PO TBDP
8.0000 mg | ORAL_TABLET | Freq: Four times a day (QID) | ORAL | 1 refills | Status: DC | PRN
Start: 2023-04-05 — End: 2023-08-09

## 2023-04-11 ENCOUNTER — Other Ambulatory Visit: Payer: Self-pay | Admitting: Family

## 2023-04-11 DIAGNOSIS — E876 Hypokalemia: Secondary | ICD-10-CM

## 2023-05-10 DIAGNOSIS — G43019 Migraine without aura, intractable, without status migrainosus: Secondary | ICD-10-CM | POA: Diagnosis not present

## 2023-05-10 DIAGNOSIS — E559 Vitamin D deficiency, unspecified: Secondary | ICD-10-CM | POA: Diagnosis not present

## 2023-05-26 ENCOUNTER — Encounter: Payer: Self-pay | Admitting: Family

## 2023-05-26 DIAGNOSIS — J301 Allergic rhinitis due to pollen: Secondary | ICD-10-CM

## 2023-05-28 MED ORDER — AZELASTINE-FLUTICASONE 137-50 MCG/ACT NA SUSP
1.0000 | Freq: Two times a day (BID) | NASAL | 2 refills | Status: DC
Start: 2023-05-28 — End: 2023-09-17

## 2023-06-05 DIAGNOSIS — F5105 Insomnia due to other mental disorder: Secondary | ICD-10-CM | POA: Diagnosis not present

## 2023-06-05 DIAGNOSIS — F4323 Adjustment disorder with mixed anxiety and depressed mood: Secondary | ICD-10-CM | POA: Diagnosis not present

## 2023-06-13 ENCOUNTER — Encounter: Payer: Self-pay | Admitting: Family Medicine

## 2023-06-13 ENCOUNTER — Encounter (INDEPENDENT_AMBULATORY_CARE_PROVIDER_SITE_OTHER): Payer: Self-pay

## 2023-06-13 ENCOUNTER — Encounter: Payer: 59 | Admitting: Family Medicine

## 2023-06-15 ENCOUNTER — Ambulatory Visit: Payer: 59 | Admitting: Physician Assistant

## 2023-06-19 DIAGNOSIS — F4323 Adjustment disorder with mixed anxiety and depressed mood: Secondary | ICD-10-CM | POA: Diagnosis not present

## 2023-06-19 DIAGNOSIS — F5105 Insomnia due to other mental disorder: Secondary | ICD-10-CM | POA: Diagnosis not present

## 2023-07-04 ENCOUNTER — Ambulatory Visit: Payer: 59 | Admitting: Family

## 2023-07-09 DIAGNOSIS — F4323 Adjustment disorder with mixed anxiety and depressed mood: Secondary | ICD-10-CM | POA: Diagnosis not present

## 2023-07-09 DIAGNOSIS — F5105 Insomnia due to other mental disorder: Secondary | ICD-10-CM | POA: Diagnosis not present

## 2023-07-17 ENCOUNTER — Ambulatory Visit: Payer: 59 | Admitting: Family

## 2023-07-17 ENCOUNTER — Ambulatory Visit: Payer: 59 | Admitting: Dermatology

## 2023-07-18 ENCOUNTER — Encounter: Payer: Self-pay | Admitting: Family

## 2023-07-18 ENCOUNTER — Other Ambulatory Visit: Payer: Self-pay | Admitting: Family

## 2023-07-18 DIAGNOSIS — R110 Nausea: Secondary | ICD-10-CM

## 2023-07-23 ENCOUNTER — Ambulatory Visit: Payer: 59 | Admitting: Dermatology

## 2023-08-02 ENCOUNTER — Ambulatory Visit: Payer: 59 | Admitting: Family Medicine

## 2023-08-03 DIAGNOSIS — F4323 Adjustment disorder with mixed anxiety and depressed mood: Secondary | ICD-10-CM | POA: Diagnosis not present

## 2023-08-03 DIAGNOSIS — F5105 Insomnia due to other mental disorder: Secondary | ICD-10-CM | POA: Diagnosis not present

## 2023-08-06 ENCOUNTER — Ambulatory Visit: Payer: 59 | Admitting: Family

## 2023-08-07 ENCOUNTER — Other Ambulatory Visit: Payer: Self-pay | Admitting: Family

## 2023-08-07 DIAGNOSIS — R110 Nausea: Secondary | ICD-10-CM

## 2023-08-07 DIAGNOSIS — F411 Generalized anxiety disorder: Secondary | ICD-10-CM | POA: Diagnosis not present

## 2023-08-16 ENCOUNTER — Ambulatory Visit: Payer: 59 | Admitting: Family

## 2023-08-17 DIAGNOSIS — F411 Generalized anxiety disorder: Secondary | ICD-10-CM | POA: Diagnosis not present

## 2023-08-23 ENCOUNTER — Encounter: Payer: Self-pay | Admitting: Family

## 2023-08-23 ENCOUNTER — Ambulatory Visit (INDEPENDENT_AMBULATORY_CARE_PROVIDER_SITE_OTHER): Payer: 59 | Admitting: Family

## 2023-08-23 VITALS — BP 122/80 | HR 68 | Temp 98.0°F | Ht 61.0 in | Wt 170.8 lb

## 2023-08-23 DIAGNOSIS — K52831 Collagenous colitis: Secondary | ICD-10-CM

## 2023-08-23 DIAGNOSIS — E559 Vitamin D deficiency, unspecified: Secondary | ICD-10-CM | POA: Diagnosis not present

## 2023-08-23 DIAGNOSIS — E876 Hypokalemia: Secondary | ICD-10-CM | POA: Diagnosis not present

## 2023-08-23 DIAGNOSIS — J329 Chronic sinusitis, unspecified: Secondary | ICD-10-CM

## 2023-08-23 DIAGNOSIS — Z862 Personal history of diseases of the blood and blood-forming organs and certain disorders involving the immune mechanism: Secondary | ICD-10-CM

## 2023-08-23 DIAGNOSIS — F411 Generalized anxiety disorder: Secondary | ICD-10-CM | POA: Diagnosis not present

## 2023-08-23 DIAGNOSIS — E538 Deficiency of other specified B group vitamins: Secondary | ICD-10-CM | POA: Diagnosis not present

## 2023-08-23 DIAGNOSIS — L80 Vitiligo: Secondary | ICD-10-CM

## 2023-08-23 DIAGNOSIS — J3081 Allergic rhinitis due to animal (cat) (dog) hair and dander: Secondary | ICD-10-CM

## 2023-08-23 DIAGNOSIS — R519 Headache, unspecified: Secondary | ICD-10-CM

## 2023-08-23 DIAGNOSIS — E039 Hypothyroidism, unspecified: Secondary | ICD-10-CM | POA: Diagnosis not present

## 2023-08-23 DIAGNOSIS — J301 Allergic rhinitis due to pollen: Secondary | ICD-10-CM

## 2023-08-23 LAB — CBC
HCT: 38.1 % (ref 36.0–46.0)
Hemoglobin: 13 g/dL (ref 12.0–15.0)
MCHC: 34 g/dL (ref 30.0–36.0)
MCV: 89.1 fL (ref 78.0–100.0)
Platelets: 267 10*3/uL (ref 150.0–400.0)
RBC: 4.28 Mil/uL (ref 3.87–5.11)
RDW: 12.3 % (ref 11.5–15.5)
WBC: 6.9 10*3/uL (ref 4.0–10.5)

## 2023-08-23 LAB — IBC + FERRITIN
Ferritin: 42.3 ng/mL (ref 10.0–291.0)
Iron: 119 ug/dL (ref 42–145)
Saturation Ratios: 41.9 % (ref 20.0–50.0)
TIBC: 284.2 ug/dL (ref 250.0–450.0)
Transferrin: 203 mg/dL — ABNORMAL LOW (ref 212.0–360.0)

## 2023-08-23 LAB — BASIC METABOLIC PANEL
BUN: 9 mg/dL (ref 6–23)
CO2: 28 meq/L (ref 19–32)
Calcium: 9.3 mg/dL (ref 8.4–10.5)
Chloride: 102 meq/L (ref 96–112)
Creatinine, Ser: 0.58 mg/dL (ref 0.40–1.20)
GFR: 109.9 mL/min (ref >60.00)
Glucose, Bld: 91 mg/dL (ref 70–99)
Potassium: 3.1 meq/L — ABNORMAL LOW (ref 3.5–5.1)
Sodium: 138 meq/L (ref 135–145)

## 2023-08-23 LAB — VITAMIN B12: Vitamin B-12: 460 pg/mL (ref 211–911)

## 2023-08-23 LAB — VITAMIN D 25 HYDROXY (VIT D DEFICIENCY, FRACTURES): VITD: 27.56 ng/mL — ABNORMAL LOW (ref 30.00–100.00)

## 2023-08-23 LAB — TSH: TSH: 0.5 u[IU]/mL (ref 0.35–5.50)

## 2023-08-23 NOTE — Progress Notes (Signed)
Established Patient Office Visit  Subjective:      CC:  Chief Complaint  Patient presents with   Medical Management of Chronic Issues    HPI: Alison Clarke is a 42 y.o. female presenting on 08/23/2023 for Medical Management of Chronic Issues . GAD, mood disorder: was referred to psychiatry last visit in May 2024 for evaluation for ADD per her request, She is taking lamictal 25 mg once daily states has been taking this for about ten years. She has since established with a therapist   Hypothyroid: followed by endo however she states he had told her she wouldn't need to f/u if managed and stable via pcp. On levothyroxine 75 mcg once daily.   low serum b12: on b12 injections with psychiatry, tolerating well.   Still not overly digesting medications when she takes them, seeing Gi however she is trying to see a specialist in [place] for collagenous colitis, but is awaiting records transfer. She has tried to take   Allergic rhinitis, h/o vertigo, had seen ENT in the past but it was 50$ copay with her insurance last and so she couldn't keep going. Had allergy testing in the past and with an allergy to [place] pigs and dust. She is taking zyrtec and also dymista with only slight improvement.   Social history:  Relevant past medical, surgical, family and social history reviewed and updated as indicated. Interim medical history since our last visit reviewed.  Allergies and medications reviewed and updated.  DATA REVIEWED: CHART IN EPIC     ROS: Negative unless specifically indicated above in HPI.    Current Outpatient Medications:    Azelastine-Fluticasone 137-50 MCG/ACT SUSP, Place 1 spray into the nose every 12 (twelve) hours., Disp: 23 g, Rfl: 2   cetirizine (ZYRTEC) 10 MG tablet, Take 10 mg by mouth daily., Disp: , Rfl:    lamoTRIgine (LAMICTAL) 25 MG tablet, Take 1 tablet (25 mg total) by mouth every evening., Disp: 90 tablet, Rfl: 3   levonorgestrel (MIRENA, 52 MG,) 20  MCG/24HR IUD, 1 each by Intrauterine route once., Disp: , Rfl:    levothyroxine (SYNTHROID, LEVOTHROID) 75 MCG tablet, Take 75 mcg by mouth daily before breakfast. , Disp: , Rfl: 0   ondansetron (ZOFRAN-ODT) 8 MG disintegrating tablet, DISSOLVE ONE TABLET BY MOUTH EVERY 6 HOURS AS NEEDED FOR NAUSEA AND/OR VOMITING, Disp: 18 tablet, Rfl: 0   promethazine (PHENERGAN) 25 MG tablet, TAKE 1 TABLET BY MOUTH DAILY, Disp: 30 tablet, Rfl: 1   venlafaxine XR (EFFEXOR-XR) 150 MG 24 hr capsule, Take 150 mg by mouth daily., Disp: , Rfl:       Objective:    BP 122/80 (BP Location: Left Arm, Patient Position: Sitting, Cuff Size: Normal)   Pulse 68   Temp 98 F (36.7 C) (Oral)   Ht 5\' 1"  (1.549 m)   Wt 170 lb 12.8 oz (77.5 kg)   SpO2 96%   BMI 32.27 kg/m   Wt Readings from Last 3 Encounters:  08/23/23 170 lb 12.8 oz (77.5 kg)  04/03/23 177 lb (80.3 kg)  04/02/20 177 lb (80.3 kg)    Physical Exam Constitutional:      General: She is not in acute distress.    Appearance: Normal appearance. She is normal weight. She is not ill-appearing, toxic-appearing or diaphoretic.  HENT:     Head: Normocephalic.  Cardiovascular:     Rate and Rhythm: Normal rate and regular rhythm.  Pulmonary:     Effort: Pulmonary effort is  normal.     Breath sounds: Normal breath sounds.  Musculoskeletal:        General: Normal range of motion.  Neurological:     General: No focal deficit present.     Mental Status: She is alert and oriented to person, place, and time. Mental status is at baseline.  Psychiatric:        Mood and Affect: Mood normal.        Behavior: Behavior normal.        Thought Content: Thought content normal.        Judgment: Judgment normal.           Assessment & Plan:  Vitamin D deficiency Assessment & Plan: Ordered vitamin d pending results.    Orders: -     VITAMIN D 25 Hydroxy (Vit-D Deficiency, Fractures)  Low serum vitamin B12 -     Vitamin B12 -     VITAMIN D 25 Hydroxy  (Vit-D Deficiency, Fractures)  Hypokalemia -     Basic metabolic panel  History of anemia -     CBC -     IBC + Ferritin  Acquired hypothyroidism Assessment & Plan: Will follow, tsh ordered today pending results. Continue levothyroxine 75 mcg once daily.   Orders: -     TSH  Vitiligo -     ANA w/Reflex  Collagenous colitis Assessment & Plan: Patient states that she is currently looking for a specialist who deals specifically with her colitis.  She is going to let me know the name and location so I can refer her appropriately.  She is followed currently by a gastroenterologist but she states that her symptoms are not controlled.  Orders: -     ANA w/Reflex  Allergic rhinitis due to animal hair and dander -     Ambulatory referral to ENT  Recurrent sinusitis -     Ambulatory referral to ENT  Sinus headache -     Ambulatory referral to ENT  GAD (generalized anxiety disorder) Assessment & Plan: Continue follow-up with psychiatry as scheduled.  Continue Lamictal as prescribed to 25 mg once daily.  Continue Effexor at increased dosage per psychiatry recommendation.   Seasonal allergic rhinitis due to pollen Assessment & Plan: Typically resulting in recurrent sinusitis.  Referral placed for ENT as patient with new insurance and cannot afford prior on her current plan.  Uncontrolled with Zyrtec and Dymista on a daily basis.  Has tried multiple therapies without success      Return in about 6 months (around 02/21/2024) for f/u thyroid.  Mort Sawyers, MSN, APRN, FNP-C [facility]

## 2023-08-23 NOTE — Assessment & Plan Note (Signed)
Ordered vitamin d pending results.   

## 2023-08-23 NOTE — Assessment & Plan Note (Signed)
Continue follow-up with psychiatry as scheduled.  Continue Lamictal as prescribed to 25 mg once daily.  Continue Effexor at increased dosage per psychiatry recommendation.

## 2023-08-23 NOTE — Assessment & Plan Note (Signed)
Will follow, tsh ordered today pending results. Continue levothyroxine 75 mcg once daily.

## 2023-08-23 NOTE — Assessment & Plan Note (Signed)
Typically resulting in recurrent sinusitis.  Referral placed for ENT as patient with new insurance and cannot afford prior on her current plan.  Uncontrolled with Zyrtec and Dymista on a daily basis.  Has tried multiple therapies without success

## 2023-08-23 NOTE — Assessment & Plan Note (Signed)
Patient states that she is currently looking for a specialist who deals specifically with her colitis.  She is going to let me know the name and location so I can refer her appropriately.  She is followed currently by a gastroenterologist but she states that her symptoms are not controlled.

## 2023-08-23 NOTE — Patient Instructions (Addendum)
  Psychiatry services:   [facility] Phone # [phone] They have locations in [place], [place], [place], and [place]. They do take [employer] I know for sure and the [employer].    Regards,   Mort Sawyers FNP-C

## 2023-08-24 ENCOUNTER — Encounter: Payer: Self-pay | Admitting: *Deleted

## 2023-08-24 ENCOUNTER — Other Ambulatory Visit: Payer: Self-pay | Admitting: Family

## 2023-08-24 DIAGNOSIS — E876 Hypokalemia: Secondary | ICD-10-CM

## 2023-08-24 LAB — ANA W/REFLEX: Anti Nuclear Antibody (ANA): NEGATIVE

## 2023-08-24 MED ORDER — POTASSIUM CHLORIDE ER 10 MEQ PO TBCR
10.0000 meq | EXTENDED_RELEASE_TABLET | Freq: Every day | ORAL | 0 refills | Status: DC
Start: 2023-08-24 — End: 2023-09-28

## 2023-08-30 ENCOUNTER — Other Ambulatory Visit: Payer: Self-pay | Admitting: Family

## 2023-08-30 DIAGNOSIS — R110 Nausea: Secondary | ICD-10-CM

## 2023-08-31 DIAGNOSIS — F411 Generalized anxiety disorder: Secondary | ICD-10-CM | POA: Diagnosis not present

## 2023-09-03 ENCOUNTER — Encounter (INDEPENDENT_AMBULATORY_CARE_PROVIDER_SITE_OTHER): Payer: Self-pay | Admitting: Otolaryngology

## 2023-09-05 DIAGNOSIS — F5105 Insomnia due to other mental disorder: Secondary | ICD-10-CM | POA: Diagnosis not present

## 2023-09-05 DIAGNOSIS — F4323 Adjustment disorder with mixed anxiety and depressed mood: Secondary | ICD-10-CM | POA: Diagnosis not present

## 2023-09-10 DIAGNOSIS — F411 Generalized anxiety disorder: Secondary | ICD-10-CM | POA: Diagnosis not present

## 2023-09-11 ENCOUNTER — Encounter (INDEPENDENT_AMBULATORY_CARE_PROVIDER_SITE_OTHER): Payer: Self-pay | Admitting: Otolaryngology

## 2023-09-14 ENCOUNTER — Ambulatory Visit (HOSPITAL_BASED_OUTPATIENT_CLINIC_OR_DEPARTMENT_OTHER): Payer: 59 | Admitting: Orthopaedic Surgery

## 2023-09-17 ENCOUNTER — Other Ambulatory Visit: Payer: Self-pay | Admitting: Family

## 2023-09-17 DIAGNOSIS — R110 Nausea: Secondary | ICD-10-CM

## 2023-09-17 DIAGNOSIS — J301 Allergic rhinitis due to pollen: Secondary | ICD-10-CM

## 2023-09-27 DIAGNOSIS — F411 Generalized anxiety disorder: Secondary | ICD-10-CM | POA: Diagnosis not present

## 2023-09-28 ENCOUNTER — Other Ambulatory Visit: Payer: Self-pay | Admitting: Family

## 2023-09-28 DIAGNOSIS — E876 Hypokalemia: Secondary | ICD-10-CM

## 2023-10-03 ENCOUNTER — Other Ambulatory Visit: Payer: Self-pay | Admitting: Family

## 2023-10-03 DIAGNOSIS — R110 Nausea: Secondary | ICD-10-CM

## 2023-10-04 DIAGNOSIS — F411 Generalized anxiety disorder: Secondary | ICD-10-CM | POA: Diagnosis not present

## 2023-10-08 ENCOUNTER — Encounter: Payer: 59 | Admitting: Family Medicine

## 2023-10-08 ENCOUNTER — Encounter: Payer: Self-pay | Admitting: Family

## 2023-10-08 DIAGNOSIS — E039 Hypothyroidism, unspecified: Secondary | ICD-10-CM

## 2023-10-08 MED ORDER — LEVOTHYROXINE SODIUM 75 MCG PO TABS: 75.0000 ug | ORAL_TABLET | Freq: Every day | ORAL | 0 refills | Status: DC

## 2023-10-09 DIAGNOSIS — F411 Generalized anxiety disorder: Secondary | ICD-10-CM | POA: Diagnosis not present

## 2023-10-18 ENCOUNTER — Encounter: Payer: 59 | Admitting: Obstetrics and Gynecology

## 2023-10-25 DIAGNOSIS — F411 Generalized anxiety disorder: Secondary | ICD-10-CM | POA: Diagnosis not present

## 2023-11-22 DIAGNOSIS — F411 Generalized anxiety disorder: Secondary | ICD-10-CM | POA: Diagnosis not present

## 2023-11-26 ENCOUNTER — Other Ambulatory Visit: Payer: Self-pay | Admitting: Family

## 2023-11-26 DIAGNOSIS — E039 Hypothyroidism, unspecified: Secondary | ICD-10-CM

## 2023-11-26 MED ORDER — LEVOTHYROXINE SODIUM 75 MCG PO TABS: 75.0000 ug | ORAL_TABLET | Freq: Every day | ORAL | 1 refills | Status: DC

## 2023-11-30 ENCOUNTER — Ambulatory Visit (HOSPITAL_BASED_OUTPATIENT_CLINIC_OR_DEPARTMENT_OTHER): Payer: 59 | Admitting: Orthopaedic Surgery

## 2023-12-03 IMAGING — CR DG CHEST 2V
2 series · 2 of 2 positions shown · non-contrast
Comparison: 09/07/2005.

CLINICAL DATA: Chest pain.

EXAM:
CHEST - 2 VIEW

[chest pa]
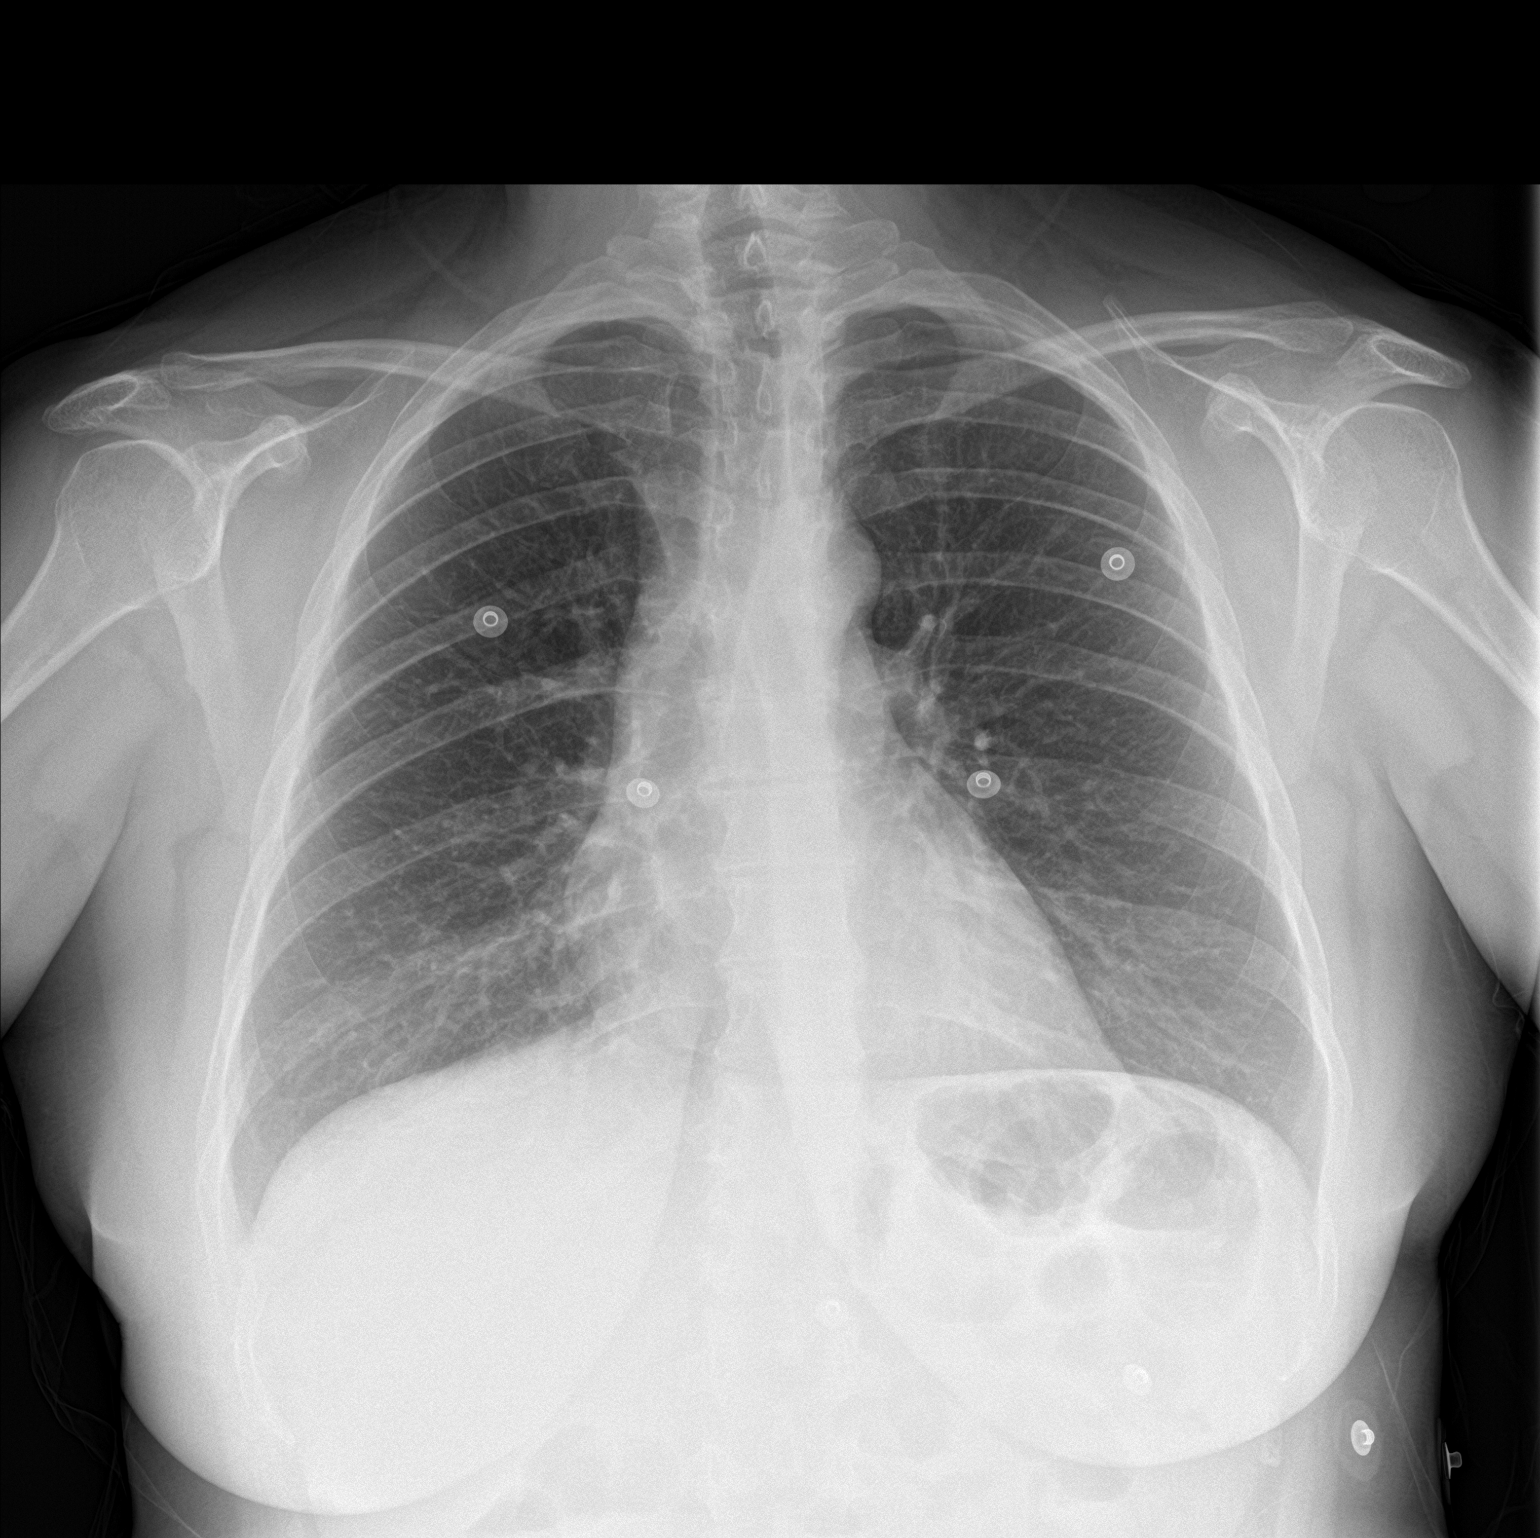

[chest lat]
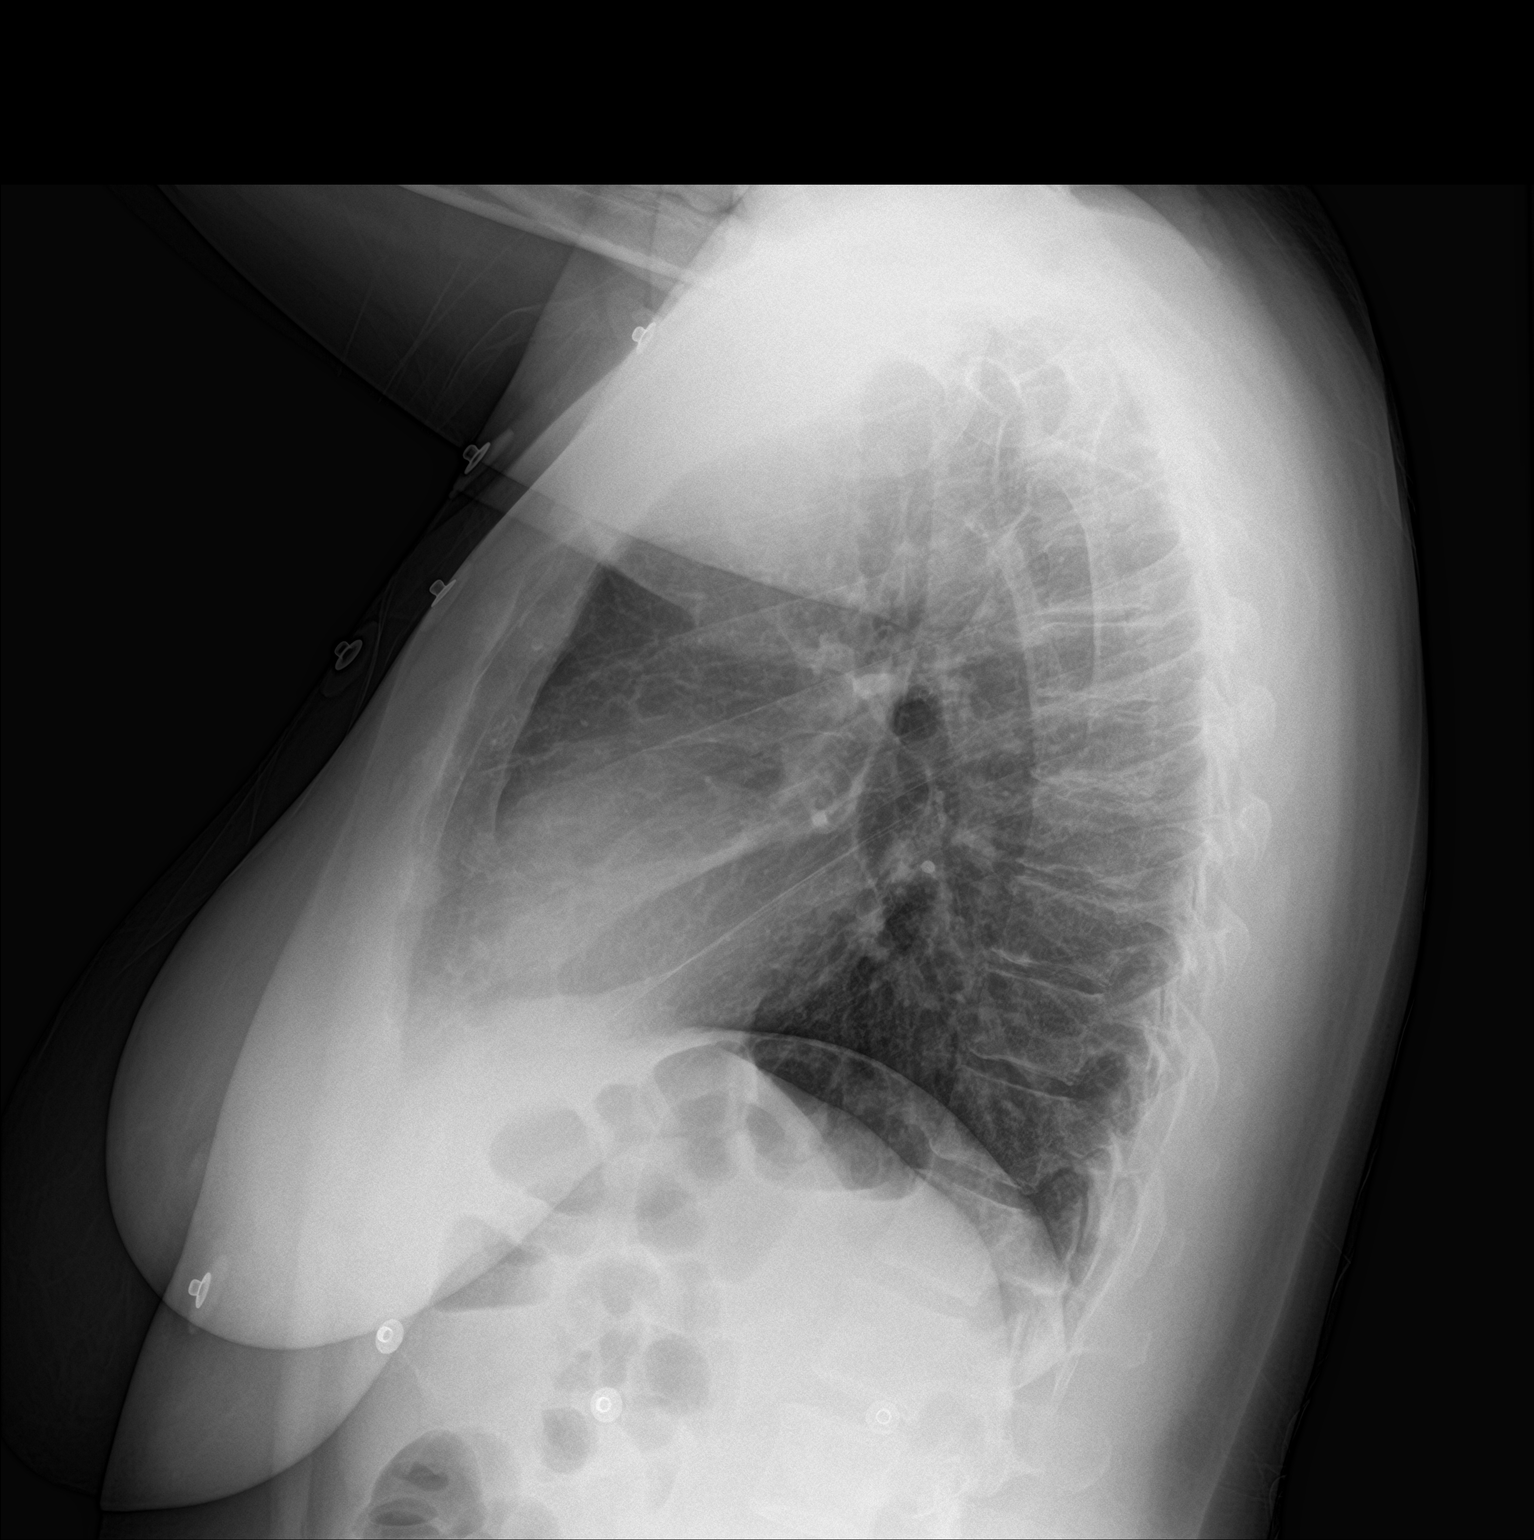

[2 of 2 positions shown; findings below may reference images not displayed]

FINDINGS: The heart size and mediastinal contours are within normal limits.
Both lungs are clear. Mild degenerative changes are noted in the
thoracic spine.
IMPRESSION: No active cardiopulmonary disease.

## 2023-12-18 ENCOUNTER — Ambulatory Visit: Payer: 59 | Admitting: Family

## 2023-12-19 ENCOUNTER — Ambulatory Visit: Payer: 59 | Admitting: Family

## 2023-12-31 ENCOUNTER — Ambulatory Visit: Payer: 59 | Admitting: Family

## 2023-12-31 ENCOUNTER — Encounter: Payer: Self-pay | Admitting: Family

## 2023-12-31 DIAGNOSIS — E039 Hypothyroidism, unspecified: Secondary | ICD-10-CM

## 2023-12-31 DIAGNOSIS — G43709 Chronic migraine without aura, not intractable, without status migrainosus: Secondary | ICD-10-CM

## 2023-12-31 NOTE — Addendum Note (Signed)
Addended by: Mort Sawyers on: 12/31/2023 02:35 PM   Modules accepted: Orders

## 2024-01-02 ENCOUNTER — Ambulatory Visit: Payer: 59 | Admitting: Family

## 2024-01-03 ENCOUNTER — Ambulatory Visit: Payer: 59 | Admitting: Family

## 2024-01-04 ENCOUNTER — Encounter: Payer: 59 | Admitting: Family Medicine

## 2024-01-08 ENCOUNTER — Encounter: Payer: Self-pay | Admitting: Family

## 2024-01-08 ENCOUNTER — Telehealth: Payer: Self-pay | Admitting: Family

## 2024-01-08 ENCOUNTER — Ambulatory Visit (INDEPENDENT_AMBULATORY_CARE_PROVIDER_SITE_OTHER): Payer: 59 | Admitting: Family

## 2024-01-08 VITALS — BP 114/72 | HR 84 | Temp 98.7°F | Ht 61.0 in | Wt 179.0 lb

## 2024-01-08 DIAGNOSIS — G43709 Chronic migraine without aura, not intractable, without status migrainosus: Secondary | ICD-10-CM | POA: Diagnosis not present

## 2024-01-08 DIAGNOSIS — R739 Hyperglycemia, unspecified: Secondary | ICD-10-CM

## 2024-01-08 DIAGNOSIS — E7800 Pure hypercholesterolemia, unspecified: Secondary | ICD-10-CM

## 2024-01-08 DIAGNOSIS — E538 Deficiency of other specified B group vitamins: Secondary | ICD-10-CM | POA: Diagnosis not present

## 2024-01-08 DIAGNOSIS — E559 Vitamin D deficiency, unspecified: Secondary | ICD-10-CM | POA: Diagnosis not present

## 2024-01-08 DIAGNOSIS — E039 Hypothyroidism, unspecified: Secondary | ICD-10-CM

## 2024-01-08 DIAGNOSIS — E876 Hypokalemia: Secondary | ICD-10-CM

## 2024-01-08 DIAGNOSIS — E063 Autoimmune thyroiditis: Secondary | ICD-10-CM

## 2024-01-08 DIAGNOSIS — F411 Generalized anxiety disorder: Secondary | ICD-10-CM

## 2024-01-08 LAB — VITAMIN D 25 HYDROXY (VIT D DEFICIENCY, FRACTURES): VITD: 16.69 ng/mL — ABNORMAL LOW (ref 30.00–100.00)

## 2024-01-08 LAB — FERRITIN: Ferritin: 41 ng/mL (ref 10.0–291.0)

## 2024-01-08 LAB — LIPID PANEL
Cholesterol: 179 mg/dL (ref 0–200)
HDL: 55.4 mg/dL (ref >39.00)
LDL Cholesterol: 106 mg/dL — ABNORMAL HIGH (ref 0–99)
NonHDL: 123.4
Total CHOL/HDL Ratio: 3
Triglycerides: 88 mg/dL (ref 0.0–149.0)
VLDL: 17.6 mg/dL (ref 0.0–40.0)

## 2024-01-08 LAB — TSH: TSH: 3.39 u[IU]/mL (ref 0.35–5.50)

## 2024-01-08 LAB — MAGNESIUM: Magnesium: 1.6 mg/dL (ref 1.5–2.5)

## 2024-01-08 LAB — VITAMIN B12: Vitamin B-12: 303 pg/mL (ref 211–911)

## 2024-01-08 LAB — T4, FREE: Free T4: 0.8 ng/dL (ref 0.60–1.60)

## 2024-01-08 MED ORDER — VENLAFAXINE HCL ER 150 MG PO CP24: 150.0000 mg | ORAL_CAPSULE | Freq: Every day | ORAL | 3 refills | Status: AC

## 2024-01-08 NOTE — Assessment & Plan Note (Signed)
 Ordered b12 pending results

## 2024-01-08 NOTE — Assessment & Plan Note (Signed)
 Stable Advised pt to get back with therapist for ongoing management and review.  Continue effexor, refill sent into pharmacy continue lamictal 25 mg once daily Referral placed for psychiatry for new consult

## 2024-01-08 NOTE — Telephone Encounter (Signed)
 Please cancel endo referral, no longer needed.

## 2024-01-08 NOTE — Assessment & Plan Note (Signed)
 Needing new neurology referral to pursue workup for recent headache change in frequency  Referral has been placed pending consult Checking magnesium, tsh, free t4, b12 and vitamin d as well pending results

## 2024-01-08 NOTE — Assessment & Plan Note (Addendum)
 Repeat tsh  Pt with fatigue, symptomatic Continue levothyroxine 75 mcg once daily  Did advise pt we do not need to f/u with endo I can manage thyroid from a primary care perspective pt ok with this

## 2024-01-08 NOTE — Assessment & Plan Note (Signed)
 Ordered vitamin d pending results.

## 2024-01-08 NOTE — Progress Notes (Signed)
 Established Patient Office Visit  Subjective:   Patient ID: Alison Clarke, female    DOB: 1981/07/21  Age: 43 y.o. MRN: [id number]  CC:  Chief Complaint  Patient presents with   Medical Management of Chronic Issues    HPI: Alison Clarke is a 43 y.o. female presenting on 01/08/2024 for Medical Management of Chronic Issues  Intractable migraine without aura: seen by neurology at [facility] however insurance has recently advised her that [facility] is no longer covered per her insurance. They had discussed possible sleep study might be warranted but held off for now, and also neurology ordered MRI brain and MRA head/heck as there was new headache semiology with headache frequency changes.  On propranolol and ajovy.   Anxiety/depression: she has been taking effexor at the 150 mg dose, ran out because she is no longer seeing her psychiatrist but did have leftover 75 mg dosing so she has been taking two of those. She does well on this dosing she does have a lot of extra stress with her husband. She is on lamictal 25 mg once daily, for a prior mood disorder.  Did try to taper off but was helping her headaches.   Hypothyroid, thyroid nodule 2018. Diagnosed in her 65's. U/s in office 12/22/22 with endo Dr. Kathreen Cosier, with thyroid with diffuse heterogeneous consistent with hashimotos. No thyroid nodules were seen.          ROS: Negative unless specifically indicated above in HPI.   Relevant past medical history reviewed and updated as indicated.   Allergies and medications reviewed and updated.   Current Outpatient Medications:    Azelastine-Fluticasone 137-50 MCG/ACT SUSP, SPRAY 1 SPRAY IN EACH NOSTRIL EVERY 12 HOURS, Disp: 23 g, Rfl: 5   cetirizine (ZYRTEC) 10 MG tablet, Take 10 mg by mouth daily., Disp: , Rfl:    lamoTRIgine (LAMICTAL) 25 MG tablet, Take 1 tablet (25 mg total) by mouth every evening., Disp: 90 tablet, Rfl: 3   levonorgestrel (MIRENA, 52 MG,) 20 MCG/24HR IUD, 1  each by Intrauterine route once., Disp: , Rfl:    levothyroxine (SYNTHROID) 75 MCG tablet, Take 1 tablet (75 mcg total) by mouth daily before breakfast., Disp: 90 tablet, Rfl: 1   ondansetron (ZOFRAN-ODT) 8 MG disintegrating tablet, DISSOLVE ONE TABLET BY MOUTH EVERY 6 HOURS AS NEEDED FOR NAUSEA AND/OR VOMITING, Disp: 18 tablet, Rfl: 0   promethazine (PHENERGAN) 25 MG tablet, TAKE 1 TABLET BY MOUTH DAILY, Disp: 30 tablet, Rfl: 1   venlafaxine XR (EFFEXOR-XR) 150 MG 24 hr capsule, Take 1 capsule (150 mg total) by mouth daily., Disp: 90 capsule, Rfl: 3  Allergies  Allergen Reactions   Sulfur Anaphylaxis   Doxycycline Nausea And Vomiting   Elemental Sulfur Hives    Swell, rash, burns skin   Hydrocodone-Acetaminophen Other (See Comments)   Morphine And Codeine Nausea And Vomiting   Vicodin [Hydrocodone-Acetaminophen] Nausea And Vomiting   Compazine [Prochlorperazine] Anxiety    Pt gets restless    Objective:   BP 114/72 (BP Location: Right Arm, Patient Position: Sitting, Cuff Size: Normal)   Pulse 84   Temp 98.7 F (37.1 C) (Temporal)   Ht 5\' 1"  (1.549 m)   Wt 179 lb (81.2 kg)   SpO2 98%   BMI 33.82 kg/m    Physical Exam Constitutional:      General: She is not in acute distress.    Appearance: Normal appearance. She is normal weight. She is not ill-appearing, toxic-appearing or diaphoretic.  HENT:  Head: Normocephalic.  Neck:     Thyroid: No thyroid mass, thyromegaly or thyroid tenderness.  Cardiovascular:     Rate and Rhythm: Normal rate and regular rhythm.  Pulmonary:     Effort: Pulmonary effort is normal.     Breath sounds: Normal breath sounds.  Musculoskeletal:        General: Normal range of motion.  Neurological:     General: No focal deficit present.     Mental Status: She is alert and oriented to person, place, and time. Mental status is at baseline.  Psychiatric:        Mood and Affect: Mood normal.        Behavior: Behavior normal.        Thought  Content: Thought content normal.        Judgment: Judgment normal.     Assessment & Plan:  GAD (generalized anxiety disorder) Assessment & Plan: Stable Advised pt to get back with therapist for ongoing management and review.  Continue effexor, refill sent into pharmacy continue lamictal 25 mg once daily Referral placed for psychiatry for new consult  Orders: -     Ambulatory referral to Psychiatry -     Venlafaxine HCl ER; Take 1 capsule (150 mg total) by mouth daily.  Dispense: 90 capsule; Refill: 3  Acquired hypothyroidism Assessment & Plan: Repeat tsh  Pt with fatigue, symptomatic Continue levothyroxine 75 mcg once daily  Did advise pt we do not need to f/u with endo I can manage thyroid from a primary care perspective pt ok with this    Hashimoto's disease -     TSH -     T4, free  Vitamin D deficiency Assessment & Plan: Ordered vitamin d pending results.    Orders: -     VITAMIN D 25 Hydroxy (Vit-D Deficiency, Fractures)  Low serum vitamin B12 Assessment & Plan: Ordered b12 pending results   Orders: -     Vitamin B12 -     Ferritin  Hyperglycemia  Hypokalemia  Chronic migraine without aura without status migrainosus, not intractable Assessment & Plan: Needing new neurology referral to pursue workup for recent headache change in frequency  Referral has been placed pending consult Checking magnesium, tsh, free t4, b12 and vitamin d as well pending results  Orders: -     Magnesium  Elevated LDL cholesterol level -     Lipid panel     Follow up plan: Return in about 6 months (around 07/07/2024) for f/u CPE.  Mort Sawyers, FNP

## 2024-01-15 ENCOUNTER — Encounter (INDEPENDENT_AMBULATORY_CARE_PROVIDER_SITE_OTHER): Payer: Self-pay

## 2024-01-24 ENCOUNTER — Ambulatory Visit: Payer: 59 | Admitting: Dermatology

## 2024-02-04 ENCOUNTER — Encounter: Payer: Self-pay | Admitting: Family

## 2024-02-07 ENCOUNTER — Encounter: Payer: Self-pay | Admitting: Family

## 2024-02-07 ENCOUNTER — Ambulatory Visit (INDEPENDENT_AMBULATORY_CARE_PROVIDER_SITE_OTHER): Admitting: Family

## 2024-02-07 VITALS — BP 134/86 | HR 64 | Temp 98.8°F | Ht 61.0 in | Wt 177.2 lb

## 2024-02-07 DIAGNOSIS — E876 Hypokalemia: Secondary | ICD-10-CM | POA: Diagnosis not present

## 2024-02-07 DIAGNOSIS — E039 Hypothyroidism, unspecified: Secondary | ICD-10-CM

## 2024-02-07 DIAGNOSIS — R2242 Localized swelling, mass and lump, left lower limb: Secondary | ICD-10-CM | POA: Diagnosis not present

## 2024-02-07 DIAGNOSIS — E559 Vitamin D deficiency, unspecified: Secondary | ICD-10-CM

## 2024-02-07 DIAGNOSIS — G43709 Chronic migraine without aura, not intractable, without status migrainosus: Secondary | ICD-10-CM

## 2024-02-07 DIAGNOSIS — M79605 Pain in left leg: Secondary | ICD-10-CM | POA: Diagnosis not present

## 2024-02-07 NOTE — Patient Instructions (Signed)
  I have sent in your order electronically for the following: ultrasound venous lower extremity at this location below. Please call to schedule the appointment at your convenience  [facility] outpatient imaging center off [address], [place] [address] Phone [phone]-  8-5 pm

## 2024-02-07 NOTE — Progress Notes (Signed)
 Established Patient Office Visit  Subjective:   Patient ID: Alison Clarke, female    DOB: Sep 05, 1981  Age: 43 y.o. MRN: [id number]  CC:  Chief Complaint  Patient presents with   Medical Management of Chronic Issues    Has a "knot" in the back on her left ankle, noticed this about 2 weeks ago.    HPI: Alison Clarke is a 43 y.o. female presenting on 02/07/2024 for Medical Management of Chronic Issues (Has a "knot" in the back on her left ankle, noticed this about 2 weeks ago.)  Hypothyroid: currently on levothyroxine 75 mcg once daily  Does not take biotin. She does take this separate from other medications.   Hypokalemia: potassium 10 meq once daily however she states not absorbing in her guy and passing full pills in her stool.   Left posterior lower ext with a 'knot' that has started to hurt her more recently and she states at time bigger but then other times will decrease in size. Was not red or hot. No known injury and or trauma. Noticed this started about two weeks ago. Not worse with walking worse at night time when lying in the bed its a constant ache.   Chronic migraine, being seen by neurologist at [facility] however insurance no longer covered under them so having to consult with another neurologist but appointment not until July.   Emgality was ineffective, neurology ordered MRI brain and MRA head and neck due to new headache symptom change and change in frequency of headaches. Was given propanolol LA 60 mg daily and taking ajovy 225 mg Brandon once monthly. Is taking ubrelvy prn acutely, MRI brain wwo contrast 08/23/22 no acute hemorrhage or acute infarct . She does have h/o for migraine without age since age 21, h/o meningitis. Had been having headaches 5/30 days and ajovy helped however headaches have increased to 15/30 days, and have changed in symptoms as well as are now right sided then prior left.      ROS: Negative unless specifically indicated above in HPI.    Relevant past medical history reviewed and updated as indicated.   Allergies and medications reviewed and updated.   Current Outpatient Medications:    Azelastine-Fluticasone 137-50 MCG/ACT SUSP, SPRAY 1 SPRAY IN EACH NOSTRIL EVERY 12 HOURS, Disp: 23 g, Rfl: 5   cetirizine (ZYRTEC) 10 MG tablet, Take 10 mg by mouth daily., Disp: , Rfl:    lamoTRIgine (LAMICTAL) 25 MG tablet, Take 1 tablet (25 mg total) by mouth every evening., Disp: 90 tablet, Rfl: 3   levonorgestrel (MIRENA, 52 MG,) 20 MCG/24HR IUD, 1 each by Intrauterine route once., Disp: , Rfl:    levothyroxine (SYNTHROID) 88 MCG tablet, Take 1 tablet (88 mcg total) by mouth daily., Disp: 90 tablet, Rfl: 3   ondansetron (ZOFRAN-ODT) 8 MG disintegrating tablet, DISSOLVE ONE TABLET BY MOUTH EVERY 6 HOURS AS NEEDED FOR NAUSEA AND/OR VOMITING, Disp: 18 tablet, Rfl: 0   venlafaxine XR (EFFEXOR-XR) 150 MG 24 hr capsule, Take 1 capsule (150 mg total) by mouth daily., Disp: 90 capsule, Rfl: 3  Allergies  Allergen Reactions   Sulfur Anaphylaxis   Doxycycline Nausea And Vomiting   Elemental Sulfur Hives    Swell, rash, burns skin   Hydrocodone-Acetaminophen Other (See Comments)   Morphine And Codeine Nausea And Vomiting   Vicodin [Hydrocodone-Acetaminophen] Nausea And Vomiting   Compazine [Prochlorperazine] Anxiety    Pt gets restless    Objective:   BP 134/86 (Cuff Size: Normal)  Pulse 64   Temp 98.8 F (37.1 C) (Temporal)   Ht 5\' 1"  (1.549 m)   Wt 177 lb 3.2 oz (80.4 kg)   SpO2 97%   BMI 33.48 kg/m    Physical Exam Vitals reviewed.  Constitutional:      General: She is not in acute distress.    Appearance: Normal appearance. She is normal weight. She is not ill-appearing, toxic-appearing or diaphoretic.  HENT:     Head: Normocephalic.  Cardiovascular:     Rate and Rhythm: Normal rate.  Pulmonary:     Effort: Pulmonary effort is normal.  Musculoskeletal:        General: Normal range of motion.  Neurological:      General: No focal deficit present.     Mental Status: She is alert and oriented to person, place, and time. Mental status is at baseline.  Psychiatric:        Mood and Affect: Mood normal.        Behavior: Behavior normal.        Thought Content: Thought content normal.        Judgment: Judgment normal.     Assessment & Plan:  Acquired hypothyroidism Assessment & Plan: Tsh ordered today pending results.    Orders: -     TSH  Hypokalemia Assessment & Plan: Repeat bmp pending  Orders: -     Basic metabolic panel with GFR  Pain of left lower extremity Assessment & Plan: Low suspicion for DVT however newer onset lower ext pain   Orders: -     US Venous Img Lower Unilateral Left (DVT); Future  Subcutaneous nodule of left lower leg -     US Venous Img Lower Unilateral Left (DVT); Future  Chronic migraine without aura without status migrainosus, not intractable Assessment & Plan: As pt unable to see her neurologist anymore due to insurance and awaiting new consult appt with new neurologist in July, I read most recent neurology note from 2/25 and will try to order the MRI brain and MRA, this will rule out structural and vascular abn. She has childhood asthma history as well as HTN.   Orders: -     MR BRAIN W WO CONTRAST; Future -     MR ANGIO NECK W WO CONTRAST; Future -     MR ANGIO HEAD WO CONTRAST; Future  Vitamin D deficiency Assessment & Plan: Continue vitamin D otc  Orders: -     MR BRAIN W WO CONTRAST; Future -     MR ANGIO NECK W WO CONTRAST; Future -     MR ANGIO HEAD WO CONTRAST; Future     Follow up plan: Return in about 6 months (around 08/09/2024) for f/u CPE.  Mort Sawyers, FNP

## 2024-02-07 NOTE — Assessment & Plan Note (Signed)
 As pt unable to see her neurologist anymore due to insurance and awaiting new consult appt with new neurologist in July, I read most recent neurology note from 2/25 and will try to order the MRI brain and MRA, this will rule out structural and vascular abn. She has childhood asthma history as well as HTN.

## 2024-02-08 LAB — TSH: TSH: 3.65 u[IU]/mL (ref 0.35–5.50)

## 2024-02-08 LAB — BASIC METABOLIC PANEL WITH GFR
BUN: 11 mg/dL (ref 6–23)
CO2: 29 meq/L (ref 19–32)
Calcium: 9.2 mg/dL (ref 8.4–10.5)
Chloride: 102 meq/L (ref 96–112)
Creatinine, Ser: 0.67 mg/dL (ref 0.40–1.20)
GFR: 105.8 mL/min (ref >60.00)
Glucose, Bld: 78 mg/dL (ref 70–99)
Potassium: 3.8 meq/L (ref 3.5–5.1)
Sodium: 139 meq/L (ref 135–145)

## 2024-02-11 ENCOUNTER — Other Ambulatory Visit: Payer: Self-pay | Admitting: Family

## 2024-02-11 ENCOUNTER — Encounter: Payer: Self-pay | Admitting: Family

## 2024-02-11 DIAGNOSIS — E039 Hypothyroidism, unspecified: Secondary | ICD-10-CM

## 2024-02-11 MED ORDER — LEVOTHYROXINE SODIUM 88 MCG PO TABS: 88.0000 ug | ORAL_TABLET | Freq: Every day | ORAL | 3 refills | Status: DC

## 2024-02-12 ENCOUNTER — Ambulatory Visit

## 2024-02-12 DIAGNOSIS — M79605 Pain in left leg: Secondary | ICD-10-CM | POA: Insufficient documentation

## 2024-02-12 NOTE — Assessment & Plan Note (Signed)
Repeat bmp pending

## 2024-02-12 NOTE — Assessment & Plan Note (Signed)
Continue vitamin D otc

## 2024-02-12 NOTE — Assessment & Plan Note (Signed)
 Low suspicion for DVT however newer onset lower ext pain

## 2024-02-12 NOTE — Assessment & Plan Note (Signed)
 Tsh ordered today pending results.

## 2024-02-13 ENCOUNTER — Ambulatory Visit (INDEPENDENT_AMBULATORY_CARE_PROVIDER_SITE_OTHER)

## 2024-02-13 ENCOUNTER — Ambulatory Visit (HOSPITAL_BASED_OUTPATIENT_CLINIC_OR_DEPARTMENT_OTHER): Admitting: Orthopaedic Surgery

## 2024-02-13 DIAGNOSIS — M7732 Calcaneal spur, left foot: Secondary | ICD-10-CM | POA: Diagnosis not present

## 2024-02-13 DIAGNOSIS — M25561 Pain in right knee: Secondary | ICD-10-CM

## 2024-02-13 DIAGNOSIS — G8929 Other chronic pain: Secondary | ICD-10-CM

## 2024-02-13 DIAGNOSIS — M25572 Pain in left ankle and joints of left foot: Secondary | ICD-10-CM

## 2024-02-13 NOTE — Progress Notes (Signed)
 Chief Complaint: Right knee pain left ankle pain     History of Present Illness:    Alison Clarke is a 43 y.o. female right knee pain which has been ongoing since she has been 14.  She states since this time she has noticed clicking popping under the kneecap with sitting longer periods of time.  She has been currently experiencing swelling in the knee.  With regard to the right ankle she is experiencing tenderness about the posterior lateral aspect of the ankle with a forcible click and pop around this area.  She does have a history distantly of ankle sprain.  Since this time she has had persistent posterior lateral ankle pain.  She does have a cyst located in the skin near the Achilles tendon and is curious if this is related to this.  She works in Research officer, political party    PMH/PSH/Family History/Social History/Meds/Allergies:    Past Medical History:  Diagnosis Date   Allergic rhinitis    Anxiety    Asthma    Colitis    Collagenous colitis 02/25/2015   02/2015 colonoscopy   Depression    Heart murmur    past hx of but not now    Hypothyroidism    IBS (irritable bowel syndrome)    Kidney stones    Meningitis    age 15 -   Scarlet fever    Shingles    Past Surgical History:  Procedure Laterality Date   COLONOSCOPY     LITHOTRIPSY     TONSILLECTOMY     WISDOM TOOTH EXTRACTION     Social History   Socioeconomic History   Marital status: Significant Other    Spouse name: Not on file   Number of children: 1   Years of education: Not on file   Highest education level: Associate degree: occupational, Scientist, product/process development, or vocational program  Occupational History   Occupation: real estate agent  Tobacco Use   Smoking status: Never   Smokeless tobacco: Never  Vaping Use   Vaping status: Never Used  Substance and Sexual Activity   Alcohol use: Yes    Alcohol/week: 0.0 standard drinks of alcohol    Comment: rare   Drug use: No   Sexual activity: Yes    Partners: Male    Birth  control/protection: I.U.D.  Other Topics Concern   Not on file  Social History Narrative   Not on file   Social Drivers of Health   Financial Resource Strain: Low Risk  (12/31/2023)   Overall Financial Resource Strain (CARDIA)    Difficulty of Paying Living Expenses: Not hard at all  Food Insecurity: No Food Insecurity (12/31/2023)   Hunger Vital Sign    Worried About Running Out of Food in the Last Year: Never true    Ran Out of Food in the Last Year: Never true  Transportation Needs: No Transportation Needs (12/31/2023)   PRAPARE - Administrator, Civil Service (Medical): No    Lack of Transportation (Non-Medical): No  Physical Activity: Unknown (12/31/2023)   Exercise Vital Sign    Days of Exercise per Week: Patient declined    Minutes of Exercise per Session: Not on file  Stress: No Stress Concern Present (12/31/2023)   [employer] of Occupational Health - Occupational Stress Questionnaire    Feeling of Stress : Only a little  Social Connections: Unknown (12/31/2023)   Social Connection and Isolation Panel [NHANES]    Frequency of Communication with Friends and  Family: More than three times a week    Frequency of Social Gatherings with Friends and Family: Once a week    Attends Religious Services: Patient declined    Database administrator or Organizations: No    Attends Engineer, structural: Not on file    Marital Status: Divorced   Family History  Problem Relation Age of Onset   Hypertension Mother    Hypertension Father    Liver cancer Father    Alcohol abuse Father    Cirrhosis Father    Uterine cancer Sister    Breast cancer Paternal Aunt 71   Colon cancer Neg Hx    Esophageal cancer Neg Hx    Rectal cancer Neg Hx    Allergies  Allergen Reactions   Sulfur Anaphylaxis   Doxycycline Nausea And Vomiting   Elemental Sulfur Hives    Swell, rash, burns skin   Hydrocodone-Acetaminophen Other (See Comments)   Morphine And Codeine Nausea And  Vomiting   Vicodin [Hydrocodone-Acetaminophen] Nausea And Vomiting   Compazine [Prochlorperazine] Anxiety    Pt gets restless   Current Outpatient Medications  Medication Sig Dispense Refill   Azelastine-Fluticasone 137-50 MCG/ACT SUSP SPRAY 1 SPRAY IN EACH NOSTRIL EVERY 12 HOURS 23 g 5   cetirizine (ZYRTEC) 10 MG tablet Take 10 mg by mouth daily.     lamoTRIgine (LAMICTAL) 25 MG tablet Take 1 tablet (25 mg total) by mouth every evening. 90 tablet 3   levonorgestrel (MIRENA, 52 MG,) 20 MCG/24HR IUD 1 each by Intrauterine route once.     levothyroxine (SYNTHROID) 88 MCG tablet Take 1 tablet (88 mcg total) by mouth daily. 90 tablet 3   ondansetron (ZOFRAN-ODT) 8 MG disintegrating tablet DISSOLVE ONE TABLET BY MOUTH EVERY 6 HOURS AS NEEDED FOR NAUSEA AND/OR VOMITING 18 tablet 0   venlafaxine XR (EFFEXOR-XR) 150 MG 24 hr capsule Take 1 capsule (150 mg total) by mouth daily. 90 capsule 3   No current facility-administered medications for this visit.   No results found.  Review of Systems:   A ROS was performed including pertinent positives and negatives as documented in the HPI.  Physical Exam :   Constitutional: NAD and appears stated age Neurological: Alert and oriented Psych: Appropriate affect and cooperative There were no vitals taken for this visit.   Comprehensive Musculoskeletal Exam:    Left ankle with tenderness about the peroneal tendons with subluxation with forced dorsi flexion and eversion.  Positive alar tilt   Right knee with tenderness about the patellofemoral joint range of motion is from -3 to 130 degrees.  No joint line tenderness negative McMurray, negative Lachman negative posterior drawer intact distal neurosensory exam  Imaging:   Xray (4 views right knee, 3 views left ankle): Normal    I personally reviewed and interpreted the radiographs.   Assessment and Plan:   43 y.o. 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   Also discussed that her left ankle is more consistent with peroneal subluxations in the setting of a chronic ankle sprain.  At this time I would like to obtain an MRI of the right knee as well as the left ankle and I will see her back to discuss results  -Plan for MRI right knee and left ankle   I personally saw and evaluated the patient, and participated in the management and treatment plan.  Huel Cote, MD Attending Physician, Orthopedic Surgery  This document was dictated using Dragon voice recognition  software. A reasonable attempt at proof reading has been made to minimize errors.

## 2024-02-15 ENCOUNTER — Ambulatory Visit

## 2024-02-15 ENCOUNTER — Ambulatory Visit: Admission: RE | Admit: 2024-02-15 | Source: Ambulatory Visit

## 2024-02-27 ENCOUNTER — Encounter (HOSPITAL_BASED_OUTPATIENT_CLINIC_OR_DEPARTMENT_OTHER): Payer: Self-pay | Admitting: Orthopaedic Surgery

## 2024-02-27 DIAGNOSIS — R140 Abdominal distension (gaseous): Secondary | ICD-10-CM | POA: Diagnosis not present

## 2024-02-27 DIAGNOSIS — E876 Hypokalemia: Secondary | ICD-10-CM | POA: Diagnosis not present

## 2024-02-27 DIAGNOSIS — R1110 Vomiting, unspecified: Secondary | ICD-10-CM | POA: Diagnosis not present

## 2024-02-28 ENCOUNTER — Encounter: Payer: Self-pay | Admitting: Family

## 2024-02-29 ENCOUNTER — Institutional Professional Consult (permissible substitution) (INDEPENDENT_AMBULATORY_CARE_PROVIDER_SITE_OTHER)

## 2024-02-29 ENCOUNTER — Encounter (HOSPITAL_BASED_OUTPATIENT_CLINIC_OR_DEPARTMENT_OTHER): Payer: Self-pay

## 2024-02-29 ENCOUNTER — Other Ambulatory Visit: Payer: Self-pay

## 2024-02-29 ENCOUNTER — Other Ambulatory Visit

## 2024-02-29 ENCOUNTER — Emergency Department (HOSPITAL_BASED_OUTPATIENT_CLINIC_OR_DEPARTMENT_OTHER)
Admission: EM | Admit: 2024-02-29 | Discharge: 2024-02-29 | Disposition: A | Discharge status: Home / Self Care | Attending: Emergency Medicine | Admitting: Emergency Medicine

## 2024-02-29 ENCOUNTER — Inpatient Hospital Stay: Admission: RE | Admit: 2024-02-29 | Source: Ambulatory Visit

## 2024-02-29 ENCOUNTER — Emergency Department (HOSPITAL_BASED_OUTPATIENT_CLINIC_OR_DEPARTMENT_OTHER)

## 2024-02-29 DIAGNOSIS — J45909 Unspecified asthma, uncomplicated: Secondary | ICD-10-CM | POA: Insufficient documentation

## 2024-02-29 DIAGNOSIS — E039 Hypothyroidism, unspecified: Secondary | ICD-10-CM | POA: Diagnosis not present

## 2024-02-29 DIAGNOSIS — R799 Abnormal finding of blood chemistry, unspecified: Secondary | ICD-10-CM | POA: Diagnosis not present

## 2024-02-29 DIAGNOSIS — R9431 Abnormal electrocardiogram [ECG] [EKG]: Secondary | ICD-10-CM | POA: Diagnosis not present

## 2024-02-29 DIAGNOSIS — N200 Calculus of kidney: Secondary | ICD-10-CM | POA: Diagnosis not present

## 2024-02-29 DIAGNOSIS — K529 Noninfective gastroenteritis and colitis, unspecified: Secondary | ICD-10-CM | POA: Diagnosis not present

## 2024-02-29 DIAGNOSIS — B349 Viral infection, unspecified: Secondary | ICD-10-CM | POA: Diagnosis not present

## 2024-02-29 DIAGNOSIS — E876 Hypokalemia: Secondary | ICD-10-CM | POA: Insufficient documentation

## 2024-02-29 LAB — COMPREHENSIVE METABOLIC PANEL WITH GFR
ALT: 10 U/L (ref 0–44)
AST: 17 U/L (ref 15–41)
Albumin: 4.7 g/dL (ref 3.5–5.0)
Alkaline Phosphatase: 57 U/L (ref 38–126)
Anion gap: 11 (ref 5–15)
BUN: 10 mg/dL (ref 6–20)
CO2: 23 mmol/L (ref 22–32)
Calcium: 9.2 mg/dL (ref 8.9–10.3)
Chloride: 105 mmol/L (ref 98–111)
Creatinine, Ser: 0.69 mg/dL (ref 0.44–1.00)
GFR, Estimated: >60 mL/min (ref >60)
Glucose, Bld: 100 mg/dL — ABNORMAL HIGH (ref 70–99)
Potassium: 2.8 mmol/L — ABNORMAL LOW (ref 3.5–5.1)
Sodium: 139 mmol/L (ref 135–145)
Total Bilirubin: 0.9 mg/dL (ref 0.0–1.2)
Total Protein: 7.3 g/dL (ref 6.5–8.1)

## 2024-02-29 LAB — CBC
HCT: 38.6 % (ref 36.0–46.0)
Hemoglobin: 13.4 g/dL (ref 12.0–15.0)
MCH: 30.2 pg (ref 26.0–34.0)
MCHC: 34.7 g/dL (ref 30.0–36.0)
MCV: 86.9 fL (ref 80.0–100.0)
Platelets: 297 10*3/uL (ref 150–400)
RBC: 4.44 MIL/uL (ref 3.87–5.11)
RDW: 12 % (ref 11.5–15.5)
WBC: 7.3 10*3/uL (ref 4.0–10.5)
nRBC: 0 % (ref 0.0–0.2)

## 2024-02-29 LAB — RESP PANEL BY RT-PCR (RSV, FLU A&B, COVID)  RVPGX2
Influenza A by PCR: NEGATIVE
Influenza B by PCR: NEGATIVE
Resp Syncytial Virus by PCR: NEGATIVE
SARS Coronavirus 2 by RT PCR: NEGATIVE

## 2024-02-29 LAB — MAGNESIUM: Magnesium: 1.9 mg/dL (ref 1.7–2.4)

## 2024-02-29 LAB — HCG, QUANTITATIVE, PREGNANCY: hCG, Beta Chain, Quant, S: <1 m[IU]/mL (ref <5)

## 2024-02-29 LAB — LIPASE, BLOOD: Lipase: 26 U/L (ref 11–51)

## 2024-02-29 MED ORDER — POTASSIUM CHLORIDE 10 MEQ/100ML IV SOLN
10.0000 meq | INTRAVENOUS | Status: AC
  Administered 2024-02-29: 10 meq via INTRAVENOUS
  Filled 2024-02-29 (×2): qty 100

## 2024-02-29 MED ORDER — POTASSIUM CHLORIDE CRYS ER 20 MEQ PO TBCR
40.0000 meq | EXTENDED_RELEASE_TABLET | Freq: Once | ORAL | Status: AC
  Administered 2024-02-29: 40 meq via ORAL
  Filled 2024-02-29: qty 2

## 2024-02-29 MED ORDER — IOHEXOL 300 MG/ML  SOLN
100.0000 mL | Freq: Once | INTRAMUSCULAR | Status: AC | PRN
  Administered 2024-02-29: 100 mL via INTRAVENOUS

## 2024-02-29 MED ORDER — ONDANSETRON HCL 4 MG/2ML IJ SOLN
4.0000 mg | Freq: Once | INTRAMUSCULAR | Status: AC
  Administered 2024-02-29: 4 mg via INTRAVENOUS
  Filled 2024-02-29: qty 2

## 2024-02-29 MED ORDER — SODIUM CHLORIDE 0.9 % IV BOLUS
1000.0000 mL | Freq: Once | INTRAVENOUS | Status: AC
  Administered 2024-02-29: 1000 mL via INTRAVENOUS

## 2024-02-29 NOTE — ED Provider Notes (Signed)
 [facility] AT [facility] Provider Note   CSN: [id number] Arrival date & time: 02/29/24  1144     History  Chief Complaint  Patient presents with   Abnormal Lab   Emesis    Alison Clarke is a 43 y.o. female history of collagenous colitis, asthma, meningitis, IBS, hypothyroidism presented for abnormal lab.  Patient states that she was recently in [place]  and was having nausea vomiting and thought her colitis was flaring up went to the ED and had a potassium of 2.1.  At this time patient does not have any chest pain and normal EKG and was discharged.  Patient states for last 24 hours she has been able to eat and drink but that due to the low potassium and concern for dehydration she presented to the ED for fluids and to have her potassium rechecked.  Patient denies any bloody diarrhea, hematochezia, melena, emesis since being seen at the outside facility, fevers, chest pain, shortness of breath.  Patient denies any abdominal pain.  Patient denies any dysuria.  Patient does note however that they are unsure if she had a UTI at the previous visit but gave her antibiotics there is a one-time dose.    Home Medications Prior to Admission medications   Medication Sig Start Date End Date Taking? Authorizing Provider  Azelastine -Fluticasone  137-50 MCG/ACT SUSP SPRAY 1 SPRAY IN EACH NOSTRIL EVERY 12 HOURS 09/17/23   Dugal, Tabitha, FNP  cetirizine (ZYRTEC) 10 MG tablet Take 10 mg by mouth daily.    [provider]  lamoTRIgine  (LAMICTAL ) 25 MG tablet Take 1 tablet (25 mg total) by mouth every evening. 03/06/23 02/29/24  Felicita Horns, FNP  levonorgestrel  (MIRENA , 52 MG,) 20 MCG/24HR IUD 1 each by Intrauterine route once.    [provider]  levothyroxine  (SYNTHROID ) 88 MCG tablet Take 1 tablet (88 mcg total) by mouth daily. 02/11/24   Dugal, Tabitha, FNP  ondansetron  (ZOFRAN -ODT) 8 MG disintegrating tablet DISSOLVE ONE TABLET BY MOUTH EVERY 6 HOURS AS  NEEDED FOR NAUSEA AND/OR VOMITING 10/04/23   Felicita Horns, FNP  venlafaxine  XR (EFFEXOR -XR) 150 MG 24 hr capsule Take 1 capsule (150 mg total) by mouth daily. 01/08/24   Dugal, Tabitha, FNP      Allergies    Sulfur, Doxycycline, Elemental sulfur, Hydrocodone-acetaminophen , Morphine  and codeine, Vicodin [hydrocodone-acetaminophen ], and Compazine  [prochlorperazine ]    Review of Systems   Review of Systems  Gastrointestinal:  Positive for vomiting.    Physical Exam Updated Vital Signs BP 114/79 (BP Location: Right Arm)   Pulse 74   Temp 98.5 F (36.9 C) (Oral)   Resp 17   Ht 5\' 7"  (1.702 m)   Wt 80.4 kg   SpO2 100%   BMI 27.76 kg/m  Physical Exam Vitals reviewed.  Constitutional:      General: She is not in acute distress. HENT:     Head: Normocephalic and atraumatic.     Mouth/Throat:     Mouth: Mucous membranes are moist.  Eyes:     Extraocular Movements: Extraocular movements intact.     Conjunctiva/sclera: Conjunctivae normal.     Pupils: Pupils are equal, round, and reactive to light.  Cardiovascular:     Rate and Rhythm: Normal rate and regular rhythm.     Pulses: Normal pulses.     Heart sounds: Normal heart sounds.     Comments: 2+ bilateral radial/dorsalis pedis pulses with regular rate Pulmonary:     Effort: Pulmonary effort is normal. No respiratory  distress.     Breath sounds: Normal breath sounds.  Abdominal:     Palpations: Abdomen is soft.     Tenderness: There is no abdominal tenderness. There is no guarding or rebound.  Musculoskeletal:        General: Normal range of motion.     Cervical back: Normal range of motion and neck supple.     Comments: 5 out of 5 bilateral grip/leg extension strength  Skin:    General: Skin is warm and dry.     Capillary Refill: Capillary refill takes less than 2 seconds.  Neurological:     General: No focal deficit present.     Mental Status: She is alert and oriented to person, place, and time.     Comments:  Sensation intact in all 4 limbs  Psychiatric:        Mood and Affect: Mood normal.     ED Results / Procedures / Treatments   Labs (all labs ordered are listed, but only abnormal results are displayed) Labs Reviewed  COMPREHENSIVE METABOLIC PANEL WITH GFR - Abnormal; Notable for the following components:      Result Value   Potassium 2.8 (*)    Glucose, Bld 100 (*)    All other components within normal limits  RESP PANEL BY RT-PCR (RSV, FLU A&B, COVID)  RVPGX2  LIPASE, BLOOD  CBC  HCG, QUANTITATIVE, PREGNANCY  MAGNESIUM    EKG EKG Interpretation Date/Time:  Friday February 29 2024 11:52:45 EDT Ventricular Rate:  67 PR Interval:  131 QRS Duration:  97 QT Interval:  399 QTC Calculation: 422 R Axis:   36  Text Interpretation: Sinus rhythm Low voltage, precordial leads Abnormal R-wave progression, early transition Confirmed by Shyrl Doyne [phone]) on 02/29/2024 1:07:24 PM  Radiology CT ABDOMEN PELVIS W CONTRAST Result Date: 02/29/2024 CLINICAL DATA:  NV, history of colitis EXAM: CT ABDOMEN AND PELVIS WITH CONTRAST TECHNIQUE: Multidetector CT imaging of the abdomen and pelvis was performed using the standard protocol following bolus administration of intravenous contrast. RADIATION DOSE REDUCTION: This exam was performed according to the departmental dose-optimization program which includes automated exposure control, adjustment of the mA and/or kV according to patient size and/or use of iterative reconstruction technique. CONTRAST:  OMNIPAQUE  IOHEXOL  300 MG/ML  SOLN COMPARISON:  December 01, 2022 FINDINGS: Lower chest: No focal airspace consolidation or pleural effusion. Hepatobiliary: No mass.No radiopaque stones or wall thickening of the gallbladder.No intrahepatic or extrahepatic biliary ductal dilation.The portal veins are patent. Pancreas: No mass or main ductal dilation.No peripancreatic inflammation or fluid collection. Spleen: Normal size. No mass. Adrenals/Urinary Tract:  No adrenal masses. No renal mass. Small nonobstructive bilateral calyceal calculi. No hydronephrosis. Decompressed urinary bladder without visualized focal abnormality. Stomach/Bowel: The stomach is decompressed without focal abnormality. No small bowel wall thickening or inflammation. No small bowel obstruction. Normal appendix. Mild wall thickening scattered throughout the ascending and transverse colon. Vascular/Lymphatic: No aortic aneurysm. No intraabdominal or pelvic lymphadenopathy. Reproductive: The uterus and ovaries are within normal limits for patient's age. Intrauterine device within the upper endometrium.No free pelvic fluid. Other: No pneumoperitoneum, ascites, or mesenteric inflammation. Musculoskeletal: No acute fracture or destructive lesion.Multilevel thoracolumbar osteophytosis. IMPRESSION: 1. Mild wall thickening scattered throughout the ascending and transverse colon, which may be due to under distension versus an infectious or inflammatory colitis, in the correct clinical context. 2. Small nonobstructive bilateral renal calculi.  No hydronephrosis. Electronically Signed   By: Rance Burrows M.D.   On: 02/29/2024 17:26  Procedures Procedures    Medications Ordered in ED Medications  potassium chloride  10 mEq in 100 mL IVPB (0 mEq Intravenous Stopped 02/29/24 1731)  sodium chloride  0.9 % bolus 1,000 mL (0 mLs Intravenous Stopped 02/29/24 1511)  ondansetron  (ZOFRAN ) injection 4 mg (4 mg Intravenous Given 02/29/24 1245)  potassium chloride  SA (KLOR-CON  M) CR tablet 40 mEq (40 mEq Oral Given 02/29/24 1315)  ondansetron  (ZOFRAN ) injection 4 mg (4 mg Intravenous Given 02/29/24 1406)  iohexol  (OMNIPAQUE ) 300 MG/ML solution 100 mL (100 mLs Intravenous Contrast Given 02/29/24 1504)    ED Course/ Medical Decision Making/ A&P                                 Medical Decision Making Amount and/or Complexity of Data Reviewed Labs: ordered. Radiology: ordered.  Risk Prescription drug  management.   Alison Clarke 43 y.o. presented today for nausea and vomiting, abnormal lab. Working DDx that I considered at this time includes, but not limited to, viral illness, hepatobiliary pathology, pancreatitis, appendicitis, cholecystitis, colitis.  R/o DDx: hepatobiliary pathology, pancreatitis, appendicitis, cholecystitis: These are considered less likely due to history of present illness, physical exam, labs/imaging findings  Review of prior external notes: 02/27/2024 ED  Unique Tests and My Independent Interpretation:  CBC: Unremarkable CMP: Hypokalemia 2.8 Lipase: Unremarkable Respiratory panel: Negative hCG serum quantitative: Negative Magnesium: Unremarkable EKG: Sinus 67 bpm, no signs of ischemia  Social Determinants of Health: none  Discussion with Independent Historian: Significant other  Discussion of Management of Tests: None  Risk: Medium: prescription drug management  Risk Stratification Score: None  Plan: On exam patient was in no acute distress with stable vitals.  Patient's exam is unremarkable as her abdomen is soft and nontender.  Upon review of outside records from 02/27/2024 at the previous hospital patient potassium was 2.6 and she did have moderate bacteria in her urine however there were squamous cells present which did contaminate the sample.  Patient is not currently endorsing any urinary symptoms with me however we will repeat urine.  Will order abdominal labs as patient is she is nauseous but has Zofran  at home and that this has been helping and that she has been tolerating p.o. for the past 24 hours.  Patient has moist mucous membranes on exam but is requesting IV fluids we will do this as well.  Patient did not endorse any bloody stools or abdominal pain with me and so at this time do not believe patient is having colitis flare as this would be expected.  Patient's labs outside the potassium are ultimately reassuring.  Patient has no white count,  no abdominal tenderness or peritoneal signs, not endorsing bloody stools or abdominal pain and after discussion with the patient we had shared decision making and agreed to forego CT scan at this time which I think is reasonable.  Will give oral and IV potassium.  Patient is able to tolerate the oral potassium as well as thus passed p.o. challenge.  I do recommend that she follows up close with her primary care provider and continue taking her Zofran  at home.  While receiving the IV potassium patient immediately began having vomiting.  Patient did not have any imaging at the previous visit and although her labs are reassuring and she has no abdominal tenderness patient I had shared decision making and patient would like a CT scan so we will order this for her continuing emesis.  Zofran  was given.  IV potassium has been held.  Patient was requesting Gatorade and was able to tolerate Gatorade and is thus passed p.o. challenge.  CT scan shows nonspecific colitis.  Patient does not have white count nor abnormal vitals nor abdominal/peritoneal signs after discussion with the patient we agreed that this is most likely viral and does not require antibiotics.  Patient is passed p.o. challenge and is safe to be discharged.  Patient does have potassium pills at home and so I recommend she takes these along with the rest of her Zofran  prescribed and follow-up closely with her primary care provider.  Work note given.  Patient was given return precautions. Patient stable for discharge at this time.  Patient verbalized understanding of plan.  This chart was dictated using voice recognition software.  Despite best efforts to proofread,  errors can occur which can change the documentation meaning.         Final Clinical Impression(s) / ED Diagnoses Final diagnoses:  Viral illness  Hypokalemia due to excessive gastrointestinal loss of potassium    Rx / DC Orders ED Discharge Orders     None          Elex Grimmer 02/29/24 1748    Ninetta Basket, MD 03/03/24 [phone]

## 2024-02-29 NOTE — ED Notes (Signed)
 Pt requested to be disconnected from IV because she was feeling nauseated and wanted to get up to use the restroom. Pt had 1 episode vomiting. Pt stated concern for potassium infusion being cause of vomiting; requested to not continue IV potassium. ED PA made aware

## 2024-02-29 NOTE — ED Triage Notes (Signed)
 Patient arrives POV with complaints of low potassium and feeling dehydrated. Patient had vomiting and she was diagnosed with low potassium earlier this week. Patient is concerned she may be dehydrated.    Hx of colitis

## 2024-02-29 NOTE — Discharge Instructions (Signed)
 Please follow-up with your primary care provider regards recent ER visit. Today your labs and imaging are reassuring most likely have a viral illness. Please use Tylenol  or ibuprofen  every 6 hours as needed for pain. Please take the Zofran  prescribed to you along with the potassium pills from the previous provider and remain hydrated and eat food as tolerated.  Today your CT scan shows that you have colitis which is most likely viral in the setting as you have normal labs and your vitals are reassuring. Please remain hydrated and eat food as tolerated. If symptoms change or worsen please return to the ER.

## 2024-03-03 NOTE — Telephone Encounter (Signed)
 If pt feels this bad and not even able to get out of bed needs to go to ER if husband can bring her and or call 911. She could try out [facility]?  Hard to say whether kidney stone, worsening colitis (with sometimes worry for perforation, kidney infection, etc) especially since I also haven't seen her.   Also I would like her to just even get a general GI referral just so that she has a GI we can refer to when she has these colitis flares at least until she finds the specialist she wants. We have had this occur a few times and I'd love to have a GI treat if necessary since it is a bit outside of my scope of practice.

## 2024-03-03 NOTE — Telephone Encounter (Signed)
 Spoke with pt. She is aware of Tabitha's response. States that she will get her boyfriend to take her to [facility] ER.

## 2024-03-03 NOTE — Telephone Encounter (Signed)
 Please see note below to triage

## 2024-03-04 ENCOUNTER — Ambulatory Visit: Admitting: Dermatology

## 2024-03-04 ENCOUNTER — Encounter: Payer: Self-pay | Admitting: Physician Assistant

## 2024-03-05 ENCOUNTER — Ambulatory Visit (INDEPENDENT_AMBULATORY_CARE_PROVIDER_SITE_OTHER): Admitting: Family

## 2024-03-05 ENCOUNTER — Ambulatory Visit
Admission: RE | Admit: 2024-03-05 | Discharge: 2024-03-05 | Disposition: A | Discharge status: Home / Self Care | Source: Ambulatory Visit | Attending: Orthopaedic Surgery | Admitting: Orthopaedic Surgery

## 2024-03-05 ENCOUNTER — Encounter: Payer: Self-pay | Admitting: Family

## 2024-03-05 VITALS — BP 116/66 | HR 67 | Temp 98.7°F | Ht 61.0 in | Wt 175.0 lb

## 2024-03-05 DIAGNOSIS — R1084 Generalized abdominal pain: Secondary | ICD-10-CM | POA: Diagnosis not present

## 2024-03-05 DIAGNOSIS — R319 Hematuria, unspecified: Secondary | ICD-10-CM | POA: Diagnosis not present

## 2024-03-05 DIAGNOSIS — E876 Hypokalemia: Secondary | ICD-10-CM | POA: Diagnosis not present

## 2024-03-05 DIAGNOSIS — G8929 Other chronic pain: Secondary | ICD-10-CM

## 2024-03-05 DIAGNOSIS — R110 Nausea: Secondary | ICD-10-CM

## 2024-03-05 DIAGNOSIS — R1114 Bilious vomiting: Secondary | ICD-10-CM | POA: Insufficient documentation

## 2024-03-05 DIAGNOSIS — R197 Diarrhea, unspecified: Secondary | ICD-10-CM | POA: Insufficient documentation

## 2024-03-05 DIAGNOSIS — K52831 Collagenous colitis: Secondary | ICD-10-CM | POA: Diagnosis not present

## 2024-03-05 DIAGNOSIS — R142 Eructation: Secondary | ICD-10-CM | POA: Diagnosis not present

## 2024-03-05 DIAGNOSIS — M25572 Pain in left ankle and joints of left foot: Secondary | ICD-10-CM

## 2024-03-05 DIAGNOSIS — M25372 Other instability, left ankle: Secondary | ICD-10-CM | POA: Diagnosis not present

## 2024-03-05 DIAGNOSIS — S8991XA Unspecified injury of right lower leg, initial encounter: Secondary | ICD-10-CM | POA: Diagnosis not present

## 2024-03-05 LAB — CBC WITH DIFFERENTIAL/PLATELET
Basophils Absolute: 0 10*3/uL (ref 0.0–0.1)
Basophils Relative: 0.8 % (ref 0.0–3.0)
Eosinophils Absolute: 0.2 10*3/uL (ref 0.0–0.7)
Eosinophils Relative: 3 % (ref 0.0–5.0)
HCT: 36.4 % (ref 36.0–46.0)
Hemoglobin: 12.3 g/dL (ref 12.0–15.0)
Lymphocytes Relative: 20.4 % (ref 12.0–46.0)
Lymphs Abs: 1.2 10*3/uL (ref 0.7–4.0)
MCHC: 33.8 g/dL (ref 30.0–36.0)
MCV: 89.9 fl (ref 78.0–100.0)
Monocytes Absolute: 0.4 10*3/uL (ref 0.1–1.0)
Monocytes Relative: 6.1 % (ref 3.0–12.0)
Neutro Abs: 4.1 10*3/uL (ref 1.4–7.7)
Neutrophils Relative %: 69.7 % (ref 43.0–77.0)
Platelets: 268 10*3/uL (ref 150.0–400.0)
RBC: 4.05 Mil/uL (ref 3.87–5.11)
RDW: 12.7 % (ref 11.5–15.5)
WBC: 5.9 10*3/uL (ref 4.0–10.5)

## 2024-03-05 LAB — BASIC METABOLIC PANEL WITH GFR
BUN: 7 mg/dL (ref 6–23)
CO2: 30 meq/L (ref 19–32)
Calcium: 8.7 mg/dL (ref 8.4–10.5)
Chloride: 105 meq/L (ref 96–112)
Creatinine, Ser: 0.78 mg/dL (ref 0.40–1.20)
GFR: 91.89 mL/min (ref >60.00)
Glucose, Bld: 88 mg/dL (ref 70–99)
Potassium: 3.1 meq/L — ABNORMAL LOW (ref 3.5–5.1)
Sodium: 140 meq/L (ref 135–145)

## 2024-03-05 LAB — POC URINALSYSI DIPSTICK (AUTOMATED)
Bilirubin, UA: NEGATIVE
Glucose, UA: NEGATIVE
Ketones, UA: NEGATIVE
Leukocytes, UA: NEGATIVE
Nitrite, UA: NEGATIVE
Protein, UA: POSITIVE — AB
Spec Grav, UA: 1.015 (ref 1.010–1.025)
Urobilinogen, UA: 0.2 U/dL
pH, UA: 6 (ref 5.0–8.0)

## 2024-03-05 MED ORDER — ONDANSETRON 8 MG PO TBDP: 8.0000 mg | ORAL_TABLET | Freq: Three times a day (TID) | ORAL | 0 refills | Status: DC | PRN

## 2024-03-05 MED ORDER — OMEPRAZOLE 20 MG PO CPDR: 20.0000 mg | DELAYED_RELEASE_CAPSULE | Freq: Every day | ORAL | 3 refills | Status: DC

## 2024-03-05 NOTE — Progress Notes (Unsigned)
 Established Patient Office Visit  Subjective:      CC:  Chief Complaint  Patient presents with   Follow-up    ED follow up    HPI: Alison Clarke is a 43 y.o. female presenting on 03/05/2024 for Follow-up (ED follow up)  Went to [facility] ER 4/18 for c/o N/V and stabbing sharp pain.  She was evaluated and was given IVF and advised to take bananas for potassium replacement.  She had low potassium of 2.1.  EKG was normal sinus  CT abd pelvis small nonobstructive bil calculi , mild wall thickening ascending and transverse colon interpreted by radiology as infectious vs inflammatory, no hydronephrosis  She does have collagenous colitis and she thinks it is currently in a flare.  She states she was dx in 2014 with this per GI.   She states she is constantly nauseous having to take her nausea medication every four hours or her nausea is too overwhelming. She has had diarrhea at least four times since 2 am but she states this is normal for her. Denies stomach pain at current, had started with bloating last week but this has improved. Denies blood or mucous stools. She did throw up this am, more bile like, she did eat some toast. She has been trying to eat a bland diet.   Colonoscopy 2021 healed scars and vasculature      Social history:  Relevant past medical, surgical, family and social history reviewed and updated as indicated. Interim medical history since our last visit reviewed.  Allergies and medications reviewed and updated.  DATA REVIEWED: CHART IN EPIC     ROS: Negative unless specifically indicated above in HPI.    Current Outpatient Medications:    omeprazole  (PRILOSEC) 20 MG capsule, Take 1 capsule (20 mg total) by mouth daily., Disp: 30 capsule, Rfl: 3   Azelastine -Fluticasone  137-50 MCG/ACT SUSP, SPRAY 1 SPRAY IN EACH NOSTRIL EVERY 12 HOURS, Disp: 23 g, Rfl: 5   cetirizine (ZYRTEC) 10 MG tablet, Take 10 mg by mouth daily., Disp: , Rfl:    lamoTRIgine   (LAMICTAL ) 25 MG tablet, Take 1 tablet (25 mg total) by mouth every evening., Disp: 90 tablet, Rfl: 3   levonorgestrel  (MIRENA , 52 MG,) 20 MCG/24HR IUD, 1 each by Intrauterine route once., Disp: , Rfl:    levothyroxine  (SYNTHROID ) 88 MCG tablet, Take 1 tablet (88 mcg total) by mouth daily., Disp: 90 tablet, Rfl: 3   ondansetron  (ZOFRAN -ODT) 8 MG disintegrating tablet, Take 1 tablet (8 mg total) by mouth every 8 (eight) hours as needed for nausea or vomiting., Disp: 18 tablet, Rfl: 0   venlafaxine  XR (EFFEXOR -XR) 150 MG 24 hr capsule, Take 1 capsule (150 mg total) by mouth daily., Disp: 90 capsule, Rfl: 3      Objective:    BP 116/66 (BP Location: Left Arm, Patient Position: Sitting, Cuff Size: Normal)   Pulse 67   Temp 98.7 F (37.1 C) (Temporal)   Ht 5\' 1"  (1.549 m)   Wt 175 lb (79.4 kg)   SpO2 98%   BMI 33.07 kg/m   Wt Readings from Last 3 Encounters:  03/05/24 175 lb (79.4 kg)  02/29/24 177 lb 4 oz (80.4 kg)  02/07/24 177 lb 3.2 oz (80.4 kg)    Physical Exam Vitals reviewed.  Constitutional:      General: She is not in acute distress.    Appearance: Normal appearance. She is normal weight. She is not ill-appearing, toxic-appearing or diaphoretic.  HENT:  Head: Normocephalic.  Cardiovascular:     Rate and Rhythm: Normal rate and regular rhythm.  Pulmonary:     Effort: Pulmonary effort is normal.  Abdominal:     General: Bowel sounds are decreased.     Tenderness: There is abdominal tenderness in the epigastric area and left lower quadrant.     Hernia: No hernia is present.  Musculoskeletal:        General: Normal range of motion.     Comments: Right sided flank pain   Neurological:     General: No focal deficit present.     Mental Status: She is alert and oriented to person, place, and time. Mental status is at baseline.  Psychiatric:        Mood and Affect: Mood normal.        Behavior: Behavior normal.        Thought Content: Thought content normal.         Judgment: Judgment normal.           Assessment & Plan:  Diarrhea of presumed infectious origin -     Giardia antigen -     C. difficile GDH and Toxin A/B -     Gastrointestinal Pathogen Pnl RT, PCR -     POCT Urinalysis Dipstick (Automated) -     CALPROTECTIN -     Ambulatory referral to Gastroenterology  Generalized abdominal pain Assessment & Plan: Improving slightly but still present Reviewed CT abd pelvis  Possible colitis, will rule out bacterial or viral with stool cultures Calproetectin ordered pending results.  Referral to GI as ongoing and recurrent diarrhea, as well as colitis dx untreated.  Alpha gal and h pylori ordered Trial omeprazole  20 mg x 2 weeks to thirty days suspect silent reflux.  Bland diet as tolerated.  Zofran  as needed refill sent.  Discussed ER precautions  Orders: -     CALPROTECTIN -     Alpha-Gal Panel -     Omeprazole ; Take 1 capsule (20 mg total) by mouth daily.  Dispense: 30 capsule; Refill: 3 -     CBC with Differential/Platelet -     Ambulatory referral to Gastroenterology  Collagenous colitis -     CALPROTECTIN -     Ambulatory referral to Gastroenterology  Burping -     Alpha-Gal Panel -     Omeprazole ; Take 1 capsule (20 mg total) by mouth daily.  Dispense: 30 capsule; Refill: 3  Bilious vomiting with nausea  Hypokalemia Assessment & Plan: Repeat bmp today pending results  Orders: -     Basic metabolic panel with GFR  Nausea -     Ondansetron ; Take 1 tablet (8 mg total) by mouth every 8 (eight) hours as needed for nausea or vomiting.  Dispense: 18 tablet; Refill: 0     Return if symptoms worsen or fail to improve.  Felicita Horns, MSN, APRN, FNP-C  [facility]

## 2024-03-05 NOTE — Assessment & Plan Note (Signed)
 Improving slightly but still present Reviewed CT abd pelvis  Possible colitis, will rule out bacterial or viral with stool cultures Calproetectin ordered pending results.  Referral to GI as ongoing and recurrent diarrhea, as well as colitis dx untreated.  Alpha gal and h pylori ordered Trial omeprazole  20 mg x 2 weeks to thirty days suspect silent reflux.  Bland diet as tolerated.  Zofran  as needed refill sent.  Discussed ER precautions

## 2024-03-05 NOTE — Assessment & Plan Note (Signed)
Repeat bmp today pending results

## 2024-03-06 ENCOUNTER — Other Ambulatory Visit: Payer: Self-pay | Admitting: Family

## 2024-03-06 ENCOUNTER — Encounter: Payer: Self-pay | Admitting: Family

## 2024-03-06 DIAGNOSIS — E876 Hypokalemia: Secondary | ICD-10-CM

## 2024-03-06 LAB — URINALYSIS W MICROSCOPIC + REFLEX CULTURE
Bacteria, UA: NONE SEEN /HPF
Bilirubin Urine: NEGATIVE
Glucose, UA: NEGATIVE
Hgb urine dipstick: NEGATIVE
Hyaline Cast: NONE SEEN /LPF
Ketones, ur: NEGATIVE
Leukocyte Esterase: NEGATIVE
Nitrites, Initial: NEGATIVE
Protein, ur: NEGATIVE
RBC / HPF: NONE SEEN /HPF (ref 0–2)
Specific Gravity, Urine: 1.017 (ref 1.001–1.035)
pH: 5.5 (ref 5.0–8.0)

## 2024-03-06 LAB — NO CULTURE INDICATED

## 2024-03-06 MED ORDER — POTASSIUM CHLORIDE CRYS ER 10 MEQ PO TBCR: 10.0000 meq | EXTENDED_RELEASE_TABLET | Freq: Two times a day (BID) | ORAL | 0 refills | Status: DC

## 2024-03-07 ENCOUNTER — Ambulatory Visit: Admitting: Gastroenterology

## 2024-03-07 ENCOUNTER — Encounter: Payer: Self-pay | Admitting: Family

## 2024-03-09 LAB — ALPHA-GAL PANEL
Allergen, Mutton, f88: <0.1 kU/L
Allergen, Pork, f26: <0.1 kU/L
Beef: <0.1 kU/L
CLASS: 0
CLASS: 0
Class: 0
GALACTOSE-ALPHA-1,3-GALACTOSE IGE*: <0.1 kU/L (ref <0.10)

## 2024-03-09 LAB — INTERPRETATION:

## 2024-03-11 ENCOUNTER — Encounter: Payer: Self-pay | Admitting: Family

## 2024-03-12 LAB — GIARDIA ANTIGEN
MICRO NUMBER:: 16365244
RESULT:: NOT DETECTED
SPECIMEN QUALITY:: ADEQUATE

## 2024-03-12 LAB — CALPROTECTIN: Calprotectin: 68 ug/g

## 2024-03-12 LAB — C. DIFFICILE GDH AND TOXIN A/B
GDH ANTIGEN: NOT DETECTED
MICRO NUMBER:: 16365235
SPECIMEN QUALITY:: ADEQUATE
TOXIN A AND B: NOT DETECTED

## 2024-03-12 LAB — GASTROINTESTINAL PATHOGEN PNL
CampyloBacter Group: NOT DETECTED
Norovirus GI/GII: NOT DETECTED
Rotavirus A: NOT DETECTED
Salmonella species: NOT DETECTED
Shiga Toxin 1: NOT DETECTED
Shiga Toxin 2: NOT DETECTED
Shigella Species: NOT DETECTED
Vibrio Group: NOT DETECTED
Yersinia enterocolitica: NOT DETECTED

## 2024-03-12 LAB — HELICOBACTER PYLORI  SPECIAL ANTIGEN
MICRO NUMBER:: 16365236
SPECIMEN QUALITY: ADEQUATE

## 2024-03-13 ENCOUNTER — Other Ambulatory Visit: Payer: Self-pay | Admitting: Family

## 2024-03-13 DIAGNOSIS — E876 Hypokalemia: Secondary | ICD-10-CM

## 2024-03-14 ENCOUNTER — Ambulatory Visit (INDEPENDENT_AMBULATORY_CARE_PROVIDER_SITE_OTHER): Admitting: Orthopaedic Surgery

## 2024-03-14 DIAGNOSIS — M25561 Pain in right knee: Secondary | ICD-10-CM | POA: Diagnosis not present

## 2024-03-14 DIAGNOSIS — M6751 Plica syndrome, right knee: Secondary | ICD-10-CM

## 2024-03-14 MED ORDER — LIDOCAINE HCL 1 % IJ SOLN
4.0000 mL | INTRAMUSCULAR | Status: AC | PRN
  Administered 2024-03-14: 4 mL

## 2024-03-14 MED ORDER — TRIAMCINOLONE ACETONIDE 40 MG/ML IJ SUSP
80.0000 mg | INTRAMUSCULAR | Status: AC | PRN
  Administered 2024-03-14: 80 mg via INTRA_ARTICULAR

## 2024-03-14 NOTE — Progress Notes (Signed)
 Chief Complaint: Right knee pain left ankle pain     History of Present Illness:   03/14/2024: Presents today for follow-up of her right knee MRI as well as left ankle MRI  Alison Clarke is a 43 y.o. female right knee pain which has been ongoing since she has been 14.  She states since this time she has noticed clicking popping under the kneecap with sitting longer periods of time.  She has been currently experiencing swelling in the knee.  With regard to the right ankle she is experiencing tenderness about the posterior lateral aspect of the ankle with a forcible click and pop around this area.  She does have a history distantly of ankle sprain.  Since this time she has had persistent posterior lateral ankle pain.  She does have a cyst located in the skin near the Achilles tendon and is curious if this is related to this.  She works in Research officer, political party    PMH/PSH/Family History/Social History/Meds/Allergies:    Past Medical History:  Diagnosis Date  . Allergic rhinitis   . Anxiety   . Asthma   . Colitis   . Collagenous colitis 02/25/2015   02/2015 colonoscopy  . Depression   . Heart murmur    past hx of but not now   . Hypothyroidism   . IBS (irritable bowel syndrome)   . Kidney stones   . Meningitis    age 28 -  . Scarlet fever   . Shingles    Past Surgical History:  Procedure Laterality Date  . COLONOSCOPY    . LITHOTRIPSY    . TONSILLECTOMY    . WISDOM TOOTH EXTRACTION     Social History   Socioeconomic History  . Marital status: Significant Other    Spouse name: Not on file  . Number of children: 1  . Years of education: Not on file  . Highest education level: Associate degree: occupational, Scientist, product/process development, or vocational program  Occupational History  . Occupation: real Insurance account manager  Tobacco Use  . Smoking status: Never  . Smokeless tobacco: Never  Vaping Use  . Vaping status: Never Used  Substance and Sexual Activity  . Alcohol use: Yes    Alcohol/week: 0.0  standard drinks of alcohol    Comment: rare  . Drug use: No  . Sexual activity: Yes    Partners: Male    Birth control/protection: I.U.D.  Other Topics Concern  . Not on file  Social History Narrative  . Not on file   Social Drivers of Health   Financial Resource Strain: Low Risk  (12/31/2023)   Overall Financial Resource Strain (CARDIA)   . Difficulty of Paying Living Expenses: Not hard at all  Food Insecurity: No Food Insecurity (12/31/2023)   Hunger Vital Sign   . Worried About Programme researcher, broadcasting/film/video in the Last Year: Never true   . Ran Out of Food in the Last Year: Never true  Transportation Needs: No Transportation Needs (12/31/2023)   PRAPARE - Transportation   . Lack of Transportation (Medical): No   . Lack of Transportation (Non-Medical): No  Physical Activity: Unknown (12/31/2023)   Exercise Vital Sign   . Days of Exercise per Week: Patient declined   . Minutes of Exercise per Session: Not on file  Stress: No Stress Concern Present (12/31/2023)   [employer] of Occupational Health - Occupational Stress Questionnaire   . Feeling of Stress : Only a little  Social Connections: Unknown (12/31/2023)  Social Connection and Isolation Panel [NHANES]   . Frequency of Communication with Friends and Family: More than three times a week   . Frequency of Social Gatherings with Friends and Family: Once a week   . Attends Religious Services: Patient declined   . Active Member of Clubs or Organizations: No   . Attends Banker Meetings: Not on file   . Marital Status: Divorced   Family History  Problem Relation Age of Onset  . Hypertension Mother   . Hypertension Father   . Liver cancer Father   . Alcohol abuse Father   . Cirrhosis Father   . Uterine cancer Sister   . Breast cancer Paternal Aunt 40  . Colon cancer Neg Hx   . Esophageal cancer Neg Hx   . Rectal cancer Neg Hx    Allergies  Allergen Reactions  . Sulfur Anaphylaxis  . Doxycycline Nausea And  Vomiting  . Elemental Sulfur Hives    Swell, rash, burns skin  . Hydrocodone-Acetaminophen  Other (See Comments)  . Morphine  And Codeine Nausea And Vomiting  . Vicodin [Hydrocodone-Acetaminophen ] Nausea And Vomiting  . Compazine  [Prochlorperazine ] Anxiety    Pt gets restless   Current Outpatient Medications  Medication Sig Dispense Refill  . Azelastine -Fluticasone  137-50 MCG/ACT SUSP SPRAY 1 SPRAY IN EACH NOSTRIL EVERY 12 HOURS 23 g 5  . cetirizine (ZYRTEC) 10 MG tablet Take 10 mg by mouth daily.    . lamoTRIgine  (LAMICTAL ) 25 MG tablet Take 1 tablet (25 mg total) by mouth every evening. 90 tablet 3  . levonorgestrel  (MIRENA , 52 MG,) 20 MCG/24HR IUD 1 each by Intrauterine route once.    . levothyroxine  (SYNTHROID ) 88 MCG tablet Take 1 tablet (88 mcg total) by mouth daily. 90 tablet 3  . omeprazole  (PRILOSEC) 20 MG capsule Take 1 capsule (20 mg total) by mouth daily. 30 capsule 3  . ondansetron  (ZOFRAN -ODT) 8 MG disintegrating tablet Take 1 tablet (8 mg total) by mouth every 8 (eight) hours as needed for nausea or vomiting. 18 tablet 0  . potassium chloride  (KLOR-CON  M) 10 MEQ tablet Take 1 tablet (10 mEq total) by mouth 2 (two) times daily for 10 days. 20 tablet 0  . venlafaxine  XR (EFFEXOR -XR) 150 MG 24 hr capsule Take 1 capsule (150 mg total) by mouth daily. 90 capsule 3   No current facility-administered medications for this visit.   No results found.  Review of Systems:   A ROS was performed including pertinent positives and negatives as documented in the HPI.  Physical Exam :   Constitutional: NAD and appears stated age Neurological: Alert and oriented Psych: Appropriate affect and cooperative There were no vitals taken for this visit.   Comprehensive Musculoskeletal Exam:    Left ankle with tenderness about the peroneal tendons with subluxation with forced dorsi flexion and eversion.  Positive alar tilt   Right knee with tenderness about the patellofemoral joint range of  motion is from -3 to 130 degrees.  No joint line tenderness negative McMurray, negative Lachman negative posterior drawer intact distal neurosensory exam  Imaging:   Xray (4 views right knee, 3 views left ankle): Normal  MRI right knee, left ankle: Right knee with medial plica and some femoral condyle hyperintensity but no significant chondral defect.  Left ankle shows chronically torn and thin ATFL tissue  I personally reviewed and interpreted the radiographs.   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  I did describe that  overall in order to differentiate this from a patellofemoral source of pain I would like to perform a right knee ultrasound-guided injection.  Will plan to proceed with this today.  With regard to the left ankle she does have evidence of chronic instability with wearing and thinning of her ATFL.  At this time she has trialed strengthening in the past.  She has not previously tolerated brace usage.  Given this we did briefly discuss the possibility of Brostrm repair.  Will plan to defer this for now.  She would like to address the right knee first.  - Right knee ultrasound-guided injection performed with verbal consent obtained    Procedure Note  Patient: Alison Clarke             Date of Birth: 07/24/1981           MRN: [id number]             Visit Date: 03/14/2024  Procedures: Visit Diagnoses: No diagnosis found.  Large Joint Inj: R knee on 03/14/2024 2:24 PM Indications: pain Details: 22 G 1.5 in needle, ultrasound-guided anterior approach  Arthrogram: No  Medications: 4 mL lidocaine  1 %; 80 mg triamcinolone acetonide 40 MG/ML Outcome: tolerated well, no immediate complications Procedure, treatment alternatives, risks and benefits explained, specific risks discussed. Consent was given by the patient. Immediately prior to procedure a time out was called to verify the correct patient, procedure, equipment, support staff and  site/side marked as required. Patient was prepped and draped in the usual sterile fashion.        I personally saw and evaluated the patient, and participated in the management and treatment plan.  Wilhelmenia Harada, MD Attending Physician, Orthopedic Surgery  This document was dictated using Dragon voice recognition software. A reasonable attempt at proof reading has been made to minimize errors.

## 2024-03-21 ENCOUNTER — Encounter (HOSPITAL_BASED_OUTPATIENT_CLINIC_OR_DEPARTMENT_OTHER): Payer: Self-pay | Admitting: Orthopaedic Surgery

## 2024-03-21 ENCOUNTER — Ambulatory Visit (HOSPITAL_BASED_OUTPATIENT_CLINIC_OR_DEPARTMENT_OTHER): Admitting: Orthopaedic Surgery

## 2024-03-27 ENCOUNTER — Ambulatory Visit (HOSPITAL_BASED_OUTPATIENT_CLINIC_OR_DEPARTMENT_OTHER): Admitting: Orthopaedic Surgery

## 2024-03-28 ENCOUNTER — Other Ambulatory Visit: Payer: Self-pay | Admitting: Family

## 2024-03-28 DIAGNOSIS — F422 Mixed obsessional thoughts and acts: Secondary | ICD-10-CM

## 2024-03-28 DIAGNOSIS — F39 Unspecified mood [affective] disorder: Secondary | ICD-10-CM

## 2024-04-17 DIAGNOSIS — K52831 Collagenous colitis: Secondary | ICD-10-CM | POA: Diagnosis not present

## 2024-04-17 DIAGNOSIS — K580 Irritable bowel syndrome with diarrhea: Secondary | ICD-10-CM | POA: Diagnosis not present

## 2024-04-24 ENCOUNTER — Encounter: Payer: Self-pay | Admitting: Family

## 2024-04-24 ENCOUNTER — Emergency Department (HOSPITAL_BASED_OUTPATIENT_CLINIC_OR_DEPARTMENT_OTHER)

## 2024-04-24 ENCOUNTER — Other Ambulatory Visit: Payer: Self-pay

## 2024-04-24 ENCOUNTER — Encounter (HOSPITAL_BASED_OUTPATIENT_CLINIC_OR_DEPARTMENT_OTHER): Payer: Self-pay | Admitting: Emergency Medicine

## 2024-04-24 ENCOUNTER — Emergency Department (HOSPITAL_BASED_OUTPATIENT_CLINIC_OR_DEPARTMENT_OTHER)
Admission: EM | Admit: 2024-04-24 | Discharge: 2024-04-24 | Disposition: A | Discharge status: Home / Self Care | Attending: Emergency Medicine | Admitting: Emergency Medicine

## 2024-04-24 DIAGNOSIS — R1084 Generalized abdominal pain: Secondary | ICD-10-CM | POA: Diagnosis not present

## 2024-04-24 DIAGNOSIS — R112 Nausea with vomiting, unspecified: Secondary | ICD-10-CM | POA: Diagnosis not present

## 2024-04-24 DIAGNOSIS — D649 Anemia, unspecified: Secondary | ICD-10-CM | POA: Diagnosis not present

## 2024-04-24 DIAGNOSIS — R1030 Lower abdominal pain, unspecified: Secondary | ICD-10-CM | POA: Diagnosis not present

## 2024-04-24 DIAGNOSIS — J45909 Unspecified asthma, uncomplicated: Secondary | ICD-10-CM | POA: Insufficient documentation

## 2024-04-24 DIAGNOSIS — E039 Hypothyroidism, unspecified: Secondary | ICD-10-CM | POA: Diagnosis not present

## 2024-04-24 DIAGNOSIS — R109 Unspecified abdominal pain: Secondary | ICD-10-CM | POA: Diagnosis not present

## 2024-04-24 DIAGNOSIS — E876 Hypokalemia: Secondary | ICD-10-CM | POA: Diagnosis not present

## 2024-04-24 DIAGNOSIS — N200 Calculus of kidney: Secondary | ICD-10-CM | POA: Diagnosis not present

## 2024-04-24 DIAGNOSIS — R197 Diarrhea, unspecified: Secondary | ICD-10-CM | POA: Diagnosis not present

## 2024-04-24 LAB — CBC WITH DIFFERENTIAL/PLATELET
Abs Immature Granulocytes: 0.01 10*3/uL (ref 0.00–0.07)
Basophils Absolute: 0.1 10*3/uL (ref 0.0–0.1)
Basophils Relative: 1 %
Eosinophils Absolute: 0.2 10*3/uL (ref 0.0–0.5)
Eosinophils Relative: 3 %
HCT: 34.5 % — ABNORMAL LOW (ref 36.0–46.0)
Hemoglobin: 11.6 g/dL — ABNORMAL LOW (ref 12.0–15.0)
Immature Granulocytes: 0 %
Lymphocytes Relative: 21 %
Lymphs Abs: 1.6 10*3/uL (ref 0.7–4.0)
MCH: 30.1 pg (ref 26.0–34.0)
MCHC: 33.6 g/dL (ref 30.0–36.0)
MCV: 89.6 fL (ref 80.0–100.0)
Monocytes Absolute: 0.5 10*3/uL (ref 0.1–1.0)
Monocytes Relative: 7 %
Neutro Abs: 5.2 10*3/uL (ref 1.7–7.7)
Neutrophils Relative %: 68 %
Platelets: 259 10*3/uL (ref 150–400)
RBC: 3.85 MIL/uL — ABNORMAL LOW (ref 3.87–5.11)
RDW: 12.5 % (ref 11.5–15.5)
WBC: 7.6 10*3/uL (ref 4.0–10.5)
nRBC: 0 % (ref 0.0–0.2)

## 2024-04-24 LAB — URINALYSIS, ROUTINE W REFLEX MICROSCOPIC
Bilirubin Urine: NEGATIVE
Glucose, UA: NEGATIVE mg/dL
Ketones, ur: NEGATIVE mg/dL
Leukocytes,Ua: NEGATIVE
Nitrite: NEGATIVE
RBC / HPF: >50 RBC/hpf (ref 0–5)
Specific Gravity, Urine: 1.03 (ref 1.005–1.030)
pH: 5.5 (ref 5.0–8.0)

## 2024-04-24 LAB — COMPREHENSIVE METABOLIC PANEL WITH GFR
ALT: 12 U/L (ref 0–44)
AST: 20 U/L (ref 15–41)
Albumin: 4.1 g/dL (ref 3.5–5.0)
Alkaline Phosphatase: 59 U/L (ref 38–126)
Anion gap: 12 (ref 5–15)
BUN: 10 mg/dL (ref 6–20)
CO2: 23 mmol/L (ref 22–32)
Calcium: 8.7 mg/dL — ABNORMAL LOW (ref 8.9–10.3)
Chloride: 104 mmol/L (ref 98–111)
Creatinine, Ser: 0.67 mg/dL (ref 0.44–1.00)
GFR, Estimated: >60 mL/min (ref >60)
Glucose, Bld: 81 mg/dL (ref 70–99)
Potassium: 3.1 mmol/L — ABNORMAL LOW (ref 3.5–5.1)
Sodium: 140 mmol/L (ref 135–145)
Total Bilirubin: 0.7 mg/dL (ref 0.0–1.2)
Total Protein: 6.4 g/dL — ABNORMAL LOW (ref 6.5–8.1)

## 2024-04-24 LAB — LIPASE, BLOOD: Lipase: 18 U/L (ref 11–51)

## 2024-04-24 LAB — PREGNANCY, URINE: Preg Test, Ur: NEGATIVE

## 2024-04-24 MED ORDER — SODIUM CHLORIDE 0.9 % IV BOLUS
1000.0000 mL | Freq: Once | INTRAVENOUS | Status: AC
  Administered 2024-04-24: 1000 mL via INTRAVENOUS

## 2024-04-24 MED ORDER — IOHEXOL 300 MG/ML  SOLN
100.0000 mL | Freq: Once | INTRAMUSCULAR | Status: AC | PRN
  Administered 2024-04-24: 100 mL via INTRAVENOUS

## 2024-04-24 MED ORDER — ONDANSETRON HCL 4 MG/2ML IJ SOLN
4.0000 mg | Freq: Once | INTRAMUSCULAR | Status: AC
  Administered 2024-04-24: 4 mg via INTRAVENOUS
  Filled 2024-04-24: qty 2

## 2024-04-24 MED ORDER — POTASSIUM CHLORIDE CRYS ER 20 MEQ PO TBCR
40.0000 meq | EXTENDED_RELEASE_TABLET | Freq: Once | ORAL | Status: AC
  Administered 2024-04-24: 40 meq via ORAL
  Filled 2024-04-24: qty 2

## 2024-04-24 MED ORDER — ONDANSETRON HCL 4 MG PO TABS: 4.0000 mg | ORAL_TABLET | Freq: Four times a day (QID) | ORAL | 0 refills | Status: DC

## 2024-04-24 NOTE — ED Provider Notes (Signed)
 [place] EMERGENCY DEPARTMENT AT [facility] Provider Note   CSN: [id number] Arrival date & time: 04/24/24  1500     Patient presents with: Abdominal Pain  HPI Alison Clarke is a 43 y.o. female with IBS, hypothyroid, asthma, colitis and kidney stone presenting for abdominal pain. Started yesterday. The pain is all about the lower abdomen. She also endorses nausea vomiting and diarrhea. She states that she always has diarrhea but it has been much worse in the last few days.  Denies fever.  States she last vomited early this morning.  Reports she has close follow-up with [facility].     Abdominal Pain      Prior to Admission medications   Medication Sig Start Date End Date Taking? Authorizing Provider  dicyclomine  (BENTYL ) 10 MG capsule Take 10 mg by mouth 2 (two) times daily as needed. 04/17/24  Yes [provider]  ondansetron  (ZOFRAN ) 4 MG tablet Take 1 tablet (4 mg total) by mouth every 6 (six) hours. 04/24/24  Yes Burk Hoctor K, PA-C  potassium chloride  (KLOR-CON ) 10 MEQ tablet Take 10 mEq by mouth daily. 03/28/24  Yes [provider]  propranolol ER (INDERAL LA) 60 MG 24 hr capsule Take 60 mg by mouth daily. 03/30/24  Yes [provider]  traZODone (DESYREL) 50 MG tablet Take 100 mg by mouth at bedtime as needed. 03/30/24  Yes [provider]  UBRELVY 100 MG TABS Take by mouth. 03/31/24  Yes [provider]  XIFAXAN  550 MG TABS tablet Take 550 mg by mouth 3 (three) times daily. 04/18/24  Yes [provider]  Azelastine -Fluticasone  137-50 MCG/ACT SUSP SPRAY 1 SPRAY IN EACH NOSTRIL EVERY 12 HOURS 09/17/23   Felicita Horns, FNP  cetirizine (ZYRTEC) 10 MG tablet Take 10 mg by mouth daily.    [provider]  lamoTRIgine  (LAMICTAL ) 25 MG tablet TAKE ONE TABLET BY MOUTH EVERY EVENING 04/01/24   Felicita Horns, FNP  levonorgestrel  (MIRENA , 52 MG,) 20 MCG/24HR IUD 1 each by Intrauterine route once.    [provider]  levothyroxine  (SYNTHROID ) 88 MCG tablet Take 1 tablet (88 mcg total) by mouth daily. 02/11/24   Dugal, Tabitha, FNP  omeprazole  (PRILOSEC) 20 MG capsule Take 1 capsule (20 mg total) by mouth daily. 03/05/24   Dugal, Tabitha, FNP  ondansetron  (ZOFRAN -ODT) 8 MG disintegrating tablet Take 1 tablet (8 mg total) by mouth every 8 (eight) hours as needed for nausea or vomiting. 03/05/24   Felicita Horns, FNP  potassium chloride  (KLOR-CON  M) 10 MEQ tablet Take 1 tablet (10 mEq total) by mouth 2 (two) times daily for 10 days. 03/06/24 03/16/24  Dugal, Tabitha, FNP  venlafaxine  XR (EFFEXOR -XR) 150 MG 24 hr capsule Take 1 capsule (150 mg total) by mouth daily. 01/08/24   Dugal, Tabitha, FNP    Allergies: Sulfur, Doxycycline, Elemental sulfur, Hydrocodone-acetaminophen , Morphine  and codeine, Vicodin [hydrocodone-acetaminophen ], and Compazine  [prochlorperazine ]    Review of Systems  Gastrointestinal:  Positive for abdominal pain.    Updated Vital Signs BP 129/75   Pulse 60   Temp 98.5 F (36.9 C) (Oral)   Resp 18   SpO2 100%   Physical Exam Vitals and nursing note reviewed.  HENT:     Head: Normocephalic and atraumatic.     Mouth/Throat:     Mouth: Mucous membranes are moist.   Eyes:     General:        Right eye: No discharge.        Left eye:  No discharge.     Conjunctiva/sclera: Conjunctivae normal.    Cardiovascular:     Rate and Rhythm: Normal rate and regular rhythm.     Pulses: Normal pulses.     Heart sounds: Normal heart sounds.  Pulmonary:     Effort: Pulmonary effort is normal.     Breath sounds: Normal breath sounds.  Abdominal:     General: Abdomen is flat. There is no distension.     Palpations: Abdomen is soft.     Tenderness: There is generalized abdominal tenderness.   Skin:    General: Skin is warm and dry.   Neurological:     General: No focal deficit present.   Psychiatric:        Mood and Affect: Mood normal.     (all labs ordered are  listed, but only abnormal results are displayed) Labs Reviewed  CBC WITH DIFFERENTIAL/PLATELET - Abnormal; Notable for the following components:      Result Value   RBC 3.85 (*)    Hemoglobin 11.6 (*)    HCT 34.5 (*)    All other components within normal limits  COMPREHENSIVE METABOLIC PANEL WITH GFR - Abnormal; Notable for the following components:   Potassium 3.1 (*)    Calcium 8.7 (*)    Total Protein 6.4 (*)    All other components within normal limits  URINALYSIS, ROUTINE W REFLEX MICROSCOPIC - Abnormal; Notable for the following components:   Hgb urine dipstick MODERATE (*)    Protein, ur TRACE (*)    Bacteria, UA RARE (*)    All other components within normal limits  LIPASE, BLOOD  PREGNANCY, URINE    EKG: None  Radiology: CT ABDOMEN PELVIS W CONTRAST Result Date: 04/24/2024 CLINICAL DATA:  Acute nonlocalized abdominal pain EXAM: CT ABDOMEN AND PELVIS WITH CONTRAST TECHNIQUE: Multidetector CT imaging of the abdomen and pelvis was performed using the standard protocol following bolus administration of intravenous contrast. RADIATION DOSE REDUCTION: This exam was performed according to the departmental dose-optimization program which includes automated exposure control, adjustment of the mA and/or kV according to patient size and/or use of iterative reconstruction technique. CONTRAST:  OMNIPAQUE  IOHEXOL  300 MG/ML  SOLN COMPARISON:  02/29/2024 FINDINGS: Lower chest: No acute abnormality. Hepatobiliary: Unremarkable liver. Normal gallbladder. No biliary dilation. Pancreas: Unremarkable. Spleen: Unremarkable. Adrenals/Urinary Tract: Normal adrenal glands. Nonobstructing left nephrolithiasis. No hydronephrosis. Bladder is unremarkable. Stomach/Bowel: Respiratory motion obscures the ascending and transverse colon. Normal caliber large and small bowel. The appendix is normal.Stomach is within normal limits. Question wall thickening about the rectum versus underdistention.  Vascular/Lymphatic: No significant vascular findings are present. No enlarged abdominal or pelvic lymph nodes. Reproductive: IUD in the uterus.  No adnexal mass. Other: No free intraperitoneal fluid or air. Musculoskeletal: No acute fracture. IMPRESSION: 1. Question wall thickening about the rectum versus underdistention. Correlate for proctitis. 2. Nonobstructing left nephrolithiasis. Electronically Signed   By: Rozell Cornet M.D.   On: 04/24/2024 19:32     Procedures   Medications Ordered in the ED  sodium chloride  0.9 % bolus 1,000 mL (0 mLs Intravenous Stopped 04/24/24 1729)  ondansetron  (ZOFRAN ) injection 4 mg (4 mg Intravenous Given 04/24/24 1611)  iohexol  (OMNIPAQUE ) 300 MG/ML solution 100 mL (100 mLs Intravenous Contrast Given 04/24/24 1903)  ondansetron  (ZOFRAN ) injection 4 mg (4 mg Intravenous Given 04/24/24 1914)  potassium chloride  SA (KLOR-CON  M) CR tablet 40 mEq (40 mEq Oral Given 04/24/24 1914)  Medical Decision Making Amount and/or Complexity of Data Reviewed Labs: ordered. Radiology: ordered.  Risk Prescription drug management.   Initial Impression and Ddx 43 year old well-appearing female presenting for abdominal pain.  Exam notable for generalized abdominal tenderness.  DDx includes diverticulitis, ischemic versus infectious colitis, appendicitis, kidney stone, acute pancreatitis, other. Patient PMH that increases complexity of ED encounter:  IBS, hypothyroid, asthma, colitis and kidney stone   Interpretation of Diagnostics - I independent reviewed and interpreted the labs as followed: hypokalemia (3.1), anemia (11.6), hematuria  - I independently visualized the following imaging with scope of interpretation limited to determining acute life threatening conditions related to emergency care: CT ab/pelvis, which revealed Question wall thickening about the rectum versus underdistention and left-sided nonobstructing  nephrolithiasis  Patient Reassessment and Ultimate Disposition/Management On reassessment, symptoms had improved.  No witnessed vomiting during this encounter. Treated with oral potassium.  Able to fluid challenge. CT scan showed findings concerning for proctitis.  Vitals are normal, no white count and patient remains well-appearing during this encounter.  She does have close follow-up with her GI medical provider.  She also mentioned that she has a scheduled appointment 3 days from now.  I sent Zofran  to her pharmacy.  Advised her to go to the appointment on Monday of next week. Discussed return precautions.  Discharged good condition.  Patient management required discussion with the following services or consulting groups:  None  Complexity of Problems Addressed Acute complicated illness or Injury  Additional Data Reviewed and Analyzed Further history obtained from: Further history from spouse/family member, Past medical history and medications listed in the EMR, and Prior ED visit notes  Patient Encounter Risk Assessment Prescriptions      Final diagnoses:  Abdominal pain, unspecified abdominal location    ED Discharge Orders          Ordered    ondansetron  (ZOFRAN ) 4 MG tablet  Every 6 hours        04/24/24 2017               Janalee Mcmurray, PA-C 04/24/24 2018    Sueellen Emery, MD 04/25/24 0730

## 2024-04-24 NOTE — ED Triage Notes (Signed)
 Vomiting, diarrhea(worse than her normal) with lower abdo pain started yesterday No help with zofran  at home Not keeping anything down Had similar episode in April.

## 2024-04-24 NOTE — Discharge Instructions (Addendum)
 Evaluation today was overall reassuring.  Please follow-up with your GI medical provider.  I sent Zofran  to your pharmacy.  If her symptoms worsen please return to the ED for further evaluation.  Your potassium was somewhat low but effectively treated with oral potassium supplement here.  Please advance your diet as tolerated and continue assertive hydration at home.

## 2024-04-28 DIAGNOSIS — R159 Full incontinence of feces: Secondary | ICD-10-CM | POA: Diagnosis not present

## 2024-04-28 DIAGNOSIS — K52831 Collagenous colitis: Secondary | ICD-10-CM | POA: Diagnosis not present

## 2024-04-28 DIAGNOSIS — R112 Nausea with vomiting, unspecified: Secondary | ICD-10-CM | POA: Diagnosis not present

## 2024-04-29 ENCOUNTER — Ambulatory Visit: Admitting: Physician Assistant

## 2024-04-29 NOTE — Telephone Encounter (Signed)
 Per referral notes the referral was closed as patient no showed and did not call back to reschedule

## 2024-05-13 ENCOUNTER — Institutional Professional Consult (permissible substitution) (INDEPENDENT_AMBULATORY_CARE_PROVIDER_SITE_OTHER): Admitting: Otolaryngology

## 2024-05-13 NOTE — Progress Notes (Deleted)
 Dear Dr. Corwin, Here is my assessment for our mutual patient, Alison Clarke. Thank you for allowing me the opportunity to care for your patient. Please do not hesitate to contact me should you have any other questions. Sincerely, Dr. Eldora Blanch  Otolaryngology Clinic Note  HISTORY: Alison Clarke is a 43 y.o. female with history of *** kindly referred by Dr. Dugal for evaluation of ***.   She reports ***.  Current symptoms include ***.  She denies ***.  Symptoms began *** ago.  Symptom severity is ***.  Improvement occurred with ***.  Additional evaluation has included ***.  Allergy testing *** been done.  *** No previous sinonasal surgery.  Has upcoming MRI  She is currently using ***.  GLP-1: *** AP/AC: ***  Tobacco: *** Lives in ***  PMHx: Migraine (after meningitis), Hypothyroidism, MDD/GAD, B12 deficiency  RADIOGRAPHIC EVALUATION AND INDEPENDENT REVIEW OF OTHER RECORDS:: Dr. Darren (12/27/2023): noted chronic migraines, h/o meningitis; worsening in freq and severity - maybe weather triggers, now on right side; ubrevl hels; no autonomic sx; ordered MRI and MRA; on ajovy and propranolol Tabitha Dugal (08/23/2023): allergic rhinitis, had allergy testing in past; on zyrtec and dymista  with only slight improvementDx: Recurrent sinusitis and allergic rhinitis; Rx: ref to ENT CBC and CMP 04/24/2024: WBC 7.6,  Eos 200; BUN/Cr 10/0.67 MRI Brain 08/23/2022:   Past Medical History:  Diagnosis Date   Allergic rhinitis    Anxiety    Asthma    Colitis    Collagenous colitis 02/25/2015   02/2015 colonoscopy   Depression    Heart murmur    past hx of but not now    Hypothyroidism    IBS (irritable bowel syndrome)    Kidney stones    Meningitis    age 89 -   Scarlet fever    Shingles    Past Surgical History:  Procedure Laterality Date   COLONOSCOPY     LITHOTRIPSY     TONSILLECTOMY     WISDOM TOOTH EXTRACTION     Family History  Problem Relation Age of Onset   Hypertension  Mother    Hypertension Father    Liver cancer Father    Alcohol abuse Father    Cirrhosis Father    Uterine cancer Sister    Breast cancer Paternal Aunt 49   Colon cancer Neg Hx    Esophageal cancer Neg Hx    Rectal cancer Neg Hx    Social History   Tobacco Use   Smoking status: Never   Smokeless tobacco: Never  Substance Use Topics   Alcohol use: Yes    Alcohol/week: 0.0 standard drinks of alcohol    Comment: rare   Allergies  Allergen Reactions   Sulfur Anaphylaxis   Doxycycline Nausea And Vomiting   Elemental Sulfur Hives    Swell, rash, burns skin   Hydrocodone-Acetaminophen  Other (See Comments)   Morphine  And Codeine Nausea And Vomiting   Vicodin [Hydrocodone-Acetaminophen ] Nausea And Vomiting   Compazine  [Prochlorperazine ] Anxiety    Pt gets restless   Current Outpatient Medications  Medication Sig Dispense Refill   Azelastine -Fluticasone  137-50 MCG/ACT SUSP SPRAY 1 SPRAY IN EACH NOSTRIL EVERY 12 HOURS 23 g 5   cetirizine (ZYRTEC) 10 MG tablet Take 10 mg by mouth daily.     dicyclomine  (BENTYL ) 10 MG capsule Take 10 mg by mouth 2 (two) times daily as needed.     lamoTRIgine  (LAMICTAL ) 25 MG tablet TAKE ONE TABLET BY MOUTH EVERY EVENING 30 tablet 2  levonorgestrel  (MIRENA , 52 MG,) 20 MCG/24HR IUD 1 each by Intrauterine route once.     levothyroxine  (SYNTHROID ) 88 MCG tablet Take 1 tablet (88 mcg total) by mouth daily. 90 tablet 3   omeprazole  (PRILOSEC) 20 MG capsule Take 1 capsule (20 mg total) by mouth daily. 30 capsule 3   ondansetron  (ZOFRAN ) 4 MG tablet Take 1 tablet (4 mg total) by mouth every 6 (six) hours. 12 tablet 0   ondansetron  (ZOFRAN -ODT) 8 MG disintegrating tablet Take 1 tablet (8 mg total) by mouth every 8 (eight) hours as needed for nausea or vomiting. 18 tablet 0   potassium chloride  (KLOR-CON  M) 10 MEQ tablet Take 1 tablet (10 mEq total) by mouth 2 (two) times daily for 10 days. 20 tablet 0   potassium chloride  (KLOR-CON ) 10 MEQ tablet Take 10 mEq  by mouth daily.     propranolol ER (INDERAL LA) 60 MG 24 hr capsule Take 60 mg by mouth daily.     traZODone (DESYREL) 50 MG tablet Take 100 mg by mouth at bedtime as needed.     UBRELVY 100 MG TABS Take by mouth.     venlafaxine  XR (EFFEXOR -XR) 150 MG 24 hr capsule Take 1 capsule (150 mg total) by mouth daily. 90 capsule 3   XIFAXAN  550 MG TABS tablet Take 550 mg by mouth 3 (three) times daily.     No current facility-administered medications for this visit.   There were no vitals taken for this visit.  PHYSICAL EXAM:  There were no vitals taken for this visit.   Salient findings:  CN II-XII intact *** Bilateral EAC clear and TM intact with well pneumatized middle ear spaces Weber 512: Rinne 512: AC > BC b/l *** Rine 1024: AC > BC b/l *** Nose: Anterior rhinoscopy reveals ***.  Nasal endoscopy was indicated to better evaluate the nose and paranasal sinuses, given the patient's history and exam findings, and is detailed below. No lesions of oral cavity/oropharynx; dentition *** No obviously palpable neck masses/lymphadenopathy/thyromegaly No respiratory distress or stridor***   PROCEDURE:  Prior to initiating any procedures, risks/benefits/alternatives were explained to the patient and verbal consent obtained. Diagnostic Nasal Endoscopy Pre-procedure diagnosis: Concern for *** Post-procedure diagnosis: same Indication: See pre-procedure diagnosis and physical exam above Complications: None apparent EBL: *** mL Anesthesia: Lidocaine  4% and topical decongestant was topically sprayed in each nasal cavity  Description of Procedure:  Patient was identified. A rigid 30 degree endoscope was utilized to evaluate the sinonasal cavities, mucosa, sinus ostia and turbinates and septum.  Overall, signs of mucosal inflammation are*** noted.  Also noted are ***.  No mucopurulence, polyps, or masses noted.   Right Middle meatus: *** Right SE Recess: *** Left MM: *** Left SE Recess:  *** Photodocumentation was obtained.  CPT CODE -- 31231 - Mod 25   ASSESSMENT:  ***  No diagnosis found.   PLAN: We've discussed issues and options today.  We reviewed the nasal endoscopy images together.  The risks, benefits and alternatives were discussed and questions answered.  She has elected to proceed with:  1) 2) Follow-up in *** -- sooner as necessary.  See below regarding exact medications prescribed this encounter including dosages and route: No orders of the defined types were placed in this encounter.    Thank you for allowing me the opportunity to care for your patient. Please do not hesitate to contact me should you have any other questions.  Sincerely, Eldora Blanch, MD Otolaryngologist (ENT), [facility]  Phone: [phone] Fax: [phone]  MDM:  Level ***: 99*** Complexity/Problems addressed: *** Data complexity: *** independent interpretation of *** - Morbidity: ***  - Prescription Drug prescribed or managed: ***  05/13/2024, 11:59 AM

## 2024-05-14 ENCOUNTER — Encounter: Payer: Self-pay | Admitting: Dermatology

## 2024-05-14 ENCOUNTER — Ambulatory Visit (INDEPENDENT_AMBULATORY_CARE_PROVIDER_SITE_OTHER): Admitting: Dermatology

## 2024-05-14 ENCOUNTER — Encounter: Payer: Self-pay | Admitting: Family

## 2024-05-14 VITALS — BP 122/79 | HR 84

## 2024-05-14 DIAGNOSIS — L578 Other skin changes due to chronic exposure to nonionizing radiation: Secondary | ICD-10-CM

## 2024-05-14 DIAGNOSIS — D229 Melanocytic nevi, unspecified: Secondary | ICD-10-CM

## 2024-05-14 DIAGNOSIS — D239 Other benign neoplasm of skin, unspecified: Secondary | ICD-10-CM

## 2024-05-14 DIAGNOSIS — W908XXA Exposure to other nonionizing radiation, initial encounter: Secondary | ICD-10-CM

## 2024-05-14 DIAGNOSIS — L700 Acne vulgaris: Secondary | ICD-10-CM

## 2024-05-14 DIAGNOSIS — L814 Other melanin hyperpigmentation: Secondary | ICD-10-CM | POA: Diagnosis not present

## 2024-05-14 DIAGNOSIS — D485 Neoplasm of uncertain behavior of skin: Secondary | ICD-10-CM

## 2024-05-14 DIAGNOSIS — D2372 Other benign neoplasm of skin of left lower limb, including hip: Secondary | ICD-10-CM | POA: Diagnosis not present

## 2024-05-14 DIAGNOSIS — Z1283 Encounter for screening for malignant neoplasm of skin: Secondary | ICD-10-CM | POA: Diagnosis not present

## 2024-05-14 DIAGNOSIS — L821 Other seborrheic keratosis: Secondary | ICD-10-CM

## 2024-05-14 DIAGNOSIS — D492 Neoplasm of unspecified behavior of bone, soft tissue, and skin: Secondary | ICD-10-CM | POA: Diagnosis not present

## 2024-05-14 DIAGNOSIS — D1801 Hemangioma of skin and subcutaneous tissue: Secondary | ICD-10-CM | POA: Diagnosis not present

## 2024-05-14 DIAGNOSIS — R5383 Other fatigue: Secondary | ICD-10-CM

## 2024-05-14 DIAGNOSIS — L80 Vitiligo: Secondary | ICD-10-CM

## 2024-05-14 MED ORDER — MINOCYCLINE HCL 50 MG PO CAPS: 50.0000 mg | ORAL_CAPSULE | Freq: Every day | ORAL | 0 refills | Status: DC

## 2024-05-14 NOTE — Progress Notes (Signed)
   New Patient Visit   Subjective  Alison Clarke is a 43 y.o. female who presents for the following: Skin Cancer Screening and Full Body Skin Exam. Hx of excision, unaware of what they removed.  The patient presents for Total-Body Skin Exam (TBSE) for skin cancer screening and mole check. The patient has spots, moles and lesions to be evaluated, some may be new or changing.  The following portions of the chart were reviewed this encounter and updated as appropriate: medications, allergies, medical history  Review of Systems:  No other skin or systemic complaints except as noted in HPI or Assessment and Plan.  Objective  Well appearing patient in no apparent distress; mood and affect are within normal limits.  A full examination was performed including scalp, head, eyes, ears, nose, lips, neck, chest, axillae, abdomen, back, buttocks, bilateral upper extremities, bilateral lower extremities, hands, feet, fingers, toes, fingernails, and toenails. All findings within normal limits unless otherwise noted below.   Relevant physical exam findings are noted in the Assessment and Plan.  Left Lower Leg - Anterior Violaceous nodule with dimple sign  Assessment & Plan   SKIN CANCER SCREENING PERFORMED TODAY.  ACTINIC DAMAGE - Chronic condition, secondary to cumulative UV/sun exposure - diffuse scaly erythematous macules with underlying dyspigmentation - Recommend daily broad spectrum sunscreen SPF 30+ to sun-exposed areas, reapply every 2 hours as needed.  - Staying in the shade or wearing long sleeves, sun glasses (UVA+UVB protection) and wide brim hats (4-inch brim around the entire circumference of the hat) are also recommended for sun protection.  - Call for new or changing lesions.  LENTIGINES, SEBORRHEIC KERATOSES, HEMANGIOMAS - Benign normal skin lesions - Benign-appearing - Call for any changes  MELANOCYTIC NEVI - Tan-brown and/or pink-flesh-colored symmetric macules and  papules - Benign appearing on exam today - Observation - Call clinic for new or changing moles - Recommend daily use of broad spectrum spf 30+ sunscreen to sun-exposed areas.   VITILIGO Exam: depigmented patches on hands, mouth and groin.  Vitiligo is a chronic autoimmune condition which causes loss of skin pigment and is commonly seen on the face and may also involve areas of trauma like hands, elbows, knees, and ankles. There is no cure and it is difficult to treat.  Treatments include topical steroids and other topical anti-inflammatory ointments/creams and topical and oral Jak inhibitors.  Sometimes narrow band UV light therapy or Xtrac laser is helpful, both of which require twice weekly treatments for at least 3-6 months.  Antioxidant vitamins, such as Vitamins A,C,E,D, Folic Acid  and B12 may be added to enhance treatment. Heliocare may also enhance treatment results.  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  I, Berwyn Lesches, Surg Tech III, am acting as scribe for RUFUS CHRISTELLA HOLY, MD.   Documentation: I have reviewed the above documentation for accuracy and completeness, and I agree with the above.  RUFUS CHRISTELLA HOLY, MD

## 2024-05-14 NOTE — Patient Instructions (Signed)

## 2024-05-19 ENCOUNTER — Encounter: Payer: Self-pay | Admitting: Family

## 2024-05-21 ENCOUNTER — Ambulatory Visit: Admission: RE | Admit: 2024-05-21 | Source: Ambulatory Visit

## 2024-05-22 ENCOUNTER — Other Ambulatory Visit: Payer: Self-pay

## 2024-05-22 MED ORDER — CLINDAMYCIN PHOSPHATE 1 % EX LOTN: TOPICAL_LOTION | Freq: Every day | CUTANEOUS | 5 refills | Status: AC

## 2024-05-22 MED ORDER — TRETINOIN 0.025 % EX CREA: TOPICAL_CREAM | Freq: Every day | CUTANEOUS | 4 refills | Status: AC

## 2024-05-22 NOTE — Progress Notes (Signed)
 Per dr  paci ok to send as pt was on previously

## 2024-05-28 ENCOUNTER — Other Ambulatory Visit: Payer: Self-pay

## 2024-05-28 ENCOUNTER — Encounter (HOSPITAL_BASED_OUTPATIENT_CLINIC_OR_DEPARTMENT_OTHER): Payer: Self-pay | Admitting: Emergency Medicine

## 2024-05-28 ENCOUNTER — Emergency Department (HOSPITAL_BASED_OUTPATIENT_CLINIC_OR_DEPARTMENT_OTHER)
Admission: EM | Admit: 2024-05-28 | Discharge: 2024-05-28 | Disposition: A | Discharge status: Home / Self Care | Source: Ambulatory Visit | Attending: Emergency Medicine | Admitting: Emergency Medicine

## 2024-05-28 DIAGNOSIS — E876 Hypokalemia: Secondary | ICD-10-CM | POA: Diagnosis not present

## 2024-05-28 DIAGNOSIS — R197 Diarrhea, unspecified: Secondary | ICD-10-CM | POA: Insufficient documentation

## 2024-05-28 DIAGNOSIS — R5383 Other fatigue: Secondary | ICD-10-CM | POA: Diagnosis not present

## 2024-05-28 DIAGNOSIS — R110 Nausea: Secondary | ICD-10-CM

## 2024-05-28 DIAGNOSIS — R112 Nausea with vomiting, unspecified: Secondary | ICD-10-CM | POA: Insufficient documentation

## 2024-05-28 LAB — COMPREHENSIVE METABOLIC PANEL WITH GFR
ALT: 11 U/L (ref 0–44)
AST: 18 U/L (ref 15–41)
Albumin: 4.6 g/dL (ref 3.5–5.0)
Alkaline Phosphatase: 73 U/L (ref 38–126)
Anion gap: 16 — ABNORMAL HIGH (ref 5–15)
BUN: 12 mg/dL (ref 6–20)
CO2: 22 mmol/L (ref 22–32)
Calcium: 9.7 mg/dL (ref 8.9–10.3)
Chloride: 102 mmol/L (ref 98–111)
Creatinine, Ser: 0.91 mg/dL (ref 0.44–1.00)
GFR, Estimated: >60 mL/min (ref >60)
Glucose, Bld: 94 mg/dL (ref 70–99)
Potassium: 3.1 mmol/L — ABNORMAL LOW (ref 3.5–5.1)
Sodium: 140 mmol/L (ref 135–145)
Total Bilirubin: 0.9 mg/dL (ref 0.0–1.2)
Total Protein: 7.5 g/dL (ref 6.5–8.1)

## 2024-05-28 LAB — CBC
HCT: 37.2 % (ref 36.0–46.0)
Hemoglobin: 13.2 g/dL (ref 12.0–15.0)
MCH: 30.6 pg (ref 26.0–34.0)
MCHC: 35.5 g/dL (ref 30.0–36.0)
MCV: 86.3 fL (ref 80.0–100.0)
Platelets: 287 K/uL (ref 150–400)
RBC: 4.31 MIL/uL (ref 3.87–5.11)
RDW: 11.9 % (ref 11.5–15.5)
WBC: 7 K/uL (ref 4.0–10.5)
nRBC: 0 % (ref 0.0–0.2)

## 2024-05-28 LAB — MAGNESIUM: Magnesium: 1.8 mg/dL (ref 1.7–2.4)

## 2024-05-28 MED ORDER — POTASSIUM CHLORIDE 10 MEQ/100ML IV SOLN
10.0000 meq | INTRAVENOUS | Status: AC
  Administered 2024-05-28 (×2): 10 meq via INTRAVENOUS
  Filled 2024-05-28 (×2): qty 100

## 2024-05-28 MED ORDER — LACTATED RINGERS IV BOLUS
1000.0000 mL | Freq: Once | INTRAVENOUS | Status: AC
  Administered 2024-05-28: 1000 mL via INTRAVENOUS

## 2024-05-28 MED ORDER — METOCLOPRAMIDE HCL 5 MG/ML IJ SOLN
5.0000 mg | Freq: Once | INTRAMUSCULAR | Status: AC
  Administered 2024-05-28: 5 mg via INTRAVENOUS
  Filled 2024-05-28: qty 2

## 2024-05-28 MED ORDER — ONDANSETRON 8 MG PO TBDP: 8.0000 mg | ORAL_TABLET | Freq: Three times a day (TID) | ORAL | 0 refills | Status: DC | PRN

## 2024-05-28 NOTE — Discharge Instructions (Addendum)
 Follow-up with your gastroenterologist as planned.  Continue your potassium.

## 2024-05-28 NOTE — ED Notes (Signed)
 DC paperwork given and verbally understood.

## 2024-05-28 NOTE — ED Provider Notes (Signed)
 [place] [facility]PART[facility] AT [facility] Provider Note   CSN: [id number] Arrival date & time: 05/28/24  1004     Patient presents with: Emesis   Alison Clarke is a 43 y.o. female.    Emesis Patient presents with nausea vomiting diarrhea.  Fatigue.  Crampiness.  Has history of collagenous colitis.  States she always has diarrhea and does get low potassium.  States she has been worse recently.  Not sleeping well.  States she gets episodes where her hands will tighten up.     Prior to Admission medications   Medication Sig Start Date End Date Taking? Authorizing Provider  Azelastine -Fluticasone  137-50 MCG/ACT SUSP SPRAY 1 SPRAY IN EACH NOSTRIL EVERY 12 HOURS 09/17/23   Dugal, Tabitha, FNP  cetirizine (ZYRTEC) 10 MG tablet Take 10 mg by mouth daily.    [provider]  clindamycin  (CLEOCIN -T) 1 % lotion Apply topically daily. 05/22/24 05/22/25  Paci, Karina M, MD  dicyclomine  (BENTYL ) 10 MG capsule Take 10 mg by mouth 2 (two) times daily as needed. 04/17/24   [provider]  lamoTRIgine  (LAMICTAL ) 25 MG tablet TAKE ONE TABLET BY MOUTH EVERY EVENING 04/01/24   Corwin Antu, FNP  levonorgestrel  (MIRENA , 52 MG,) 20 MCG/24HR IUD 1 each by Intrauterine route once.    [provider]  levothyroxine  (SYNTHROID ) 88 MCG tablet Take 1 tablet (88 mcg total) by mouth daily. 02/11/24   Dugal, Tabitha, FNP  minocycline  (MINOCIN ) 50 MG capsule Take 1 capsule (50 mg total) by mouth daily. 05/14/24   Paci, Karina M, MD  omeprazole  (PRILOSEC) 20 MG capsule Take 1 capsule (20 mg total) by mouth daily. 03/05/24   Dugal, Tabitha, FNP  ondansetron  (ZOFRAN ) 4 MG tablet Take 1 tablet (4 mg total) by mouth every 6 (six) hours. 04/24/24   Robinson, John K, PA-C  ondansetron  (ZOFRAN -ODT) 8 MG disintegrating tablet Take 1 tablet (8 mg total) by mouth every 8 (eight) hours as needed for nausea or vomiting. 05/28/24   Patsey Lot, MD  potassium chloride  (KLOR-CON  M) 10 MEQ  tablet Take 1 tablet (10 mEq total) by mouth 2 (two) times daily for 10 days. 03/06/24 03/16/24  Dugal, Tabitha, FNP  potassium chloride  (KLOR-CON ) 10 MEQ tablet Take 10 mEq by mouth daily. 03/28/24   [provider]  propranolol ER (INDERAL LA) 60 MG 24 hr capsule Take 60 mg by mouth daily. 03/30/24   [provider]  traZODone (DESYREL) 50 MG tablet Take 100 mg by mouth at bedtime as needed. 03/30/24   [provider]  tretinoin  (RETIN-A ) 0.025 % cream Apply topically at bedtime. 05/22/24 05/22/25  Paci, Karina M, MD  UBRELVY 100 MG TABS Take by mouth. 03/31/24   [provider]  venlafaxine  XR (EFFEXOR -XR) 150 MG 24 hr capsule Take 1 capsule (150 mg total) by mouth daily. 01/08/24   Corwin Antu, FNP  XIFAXAN  550 MG TABS tablet Take 550 mg by mouth 3 (three) times daily. 04/18/24   [provider]    Allergies: Sulfur, Doxycycline, Elemental sulfur, Hydrocodone-acetaminophen , Morphine  and codeine, Vicodin [hydrocodone-acetaminophen ], and Compazine  [prochlorperazine ]    Review of Systems  Gastrointestinal:  Positive for vomiting.    Updated Vital Signs BP 110/71   Pulse 65   Temp 97.8 F (36.6 C) (Oral)   Resp 20   SpO2 100%   Physical Exam Vitals and nursing note reviewed.  HENT:     Head: Normocephalic.  Eyes:     Pupils: Pupils are equal, round, and  reactive to light.  Abdominal:     Comments: Minimal abdominal tenderness without rebound or guarding.  No hernia palpated.  Neurological:     Mental Status: She is alert.     (all labs ordered are listed, but only abnormal results are displayed) Labs Reviewed  COMPREHENSIVE METABOLIC PANEL WITH GFR - Abnormal; Notable for the following components:      Result Value   Potassium 3.1 (*)    Anion gap 16 (*)    All other components within normal limits  CBC  MAGNESIUM    EKG: EKG Interpretation Date/Time:  Wednesday May 28 2024 11:08:36 EDT Ventricular Rate:  81 PR  Interval:  131 QRS Duration:  100 QT Interval:  401 QTC Calculation: 466 R Axis:   41  Text Interpretation: Sinus rhythm Low voltage, precordial leads Abnormal R-wave progression, early transition Baseline wander in lead(s) V1 V2 Confirmed by Patsey Lot [phone]) on 05/28/2024 11:38:10 AM  Radiology: No results found.   Procedures   Medications Ordered in the ED  lactated ringers  bolus 1,000 mL (0 mLs Intravenous Stopped 05/28/24 1240)  metoCLOPramide  (REGLAN ) injection 5 mg (5 mg Intravenous Given 05/28/24 1056)  potassium chloride  10 mEq in 100 mL IVPB (0 mEq Intravenous Stopped 05/28/24 1404)  lactated ringers  bolus 1,000 mL (0 mLs Intravenous Stopped 05/28/24 1423)                                    Medical Decision Making Amount and/or Complexity of Data Reviewed Labs: ordered.  Risk Prescription drug management.   Patient history of some chronic GI issues.  Feeling worse now.  Thinks she could be dehydrated.  Has had vomiting on top of her normal diarrhea.  Differential diagnosis includes obstruction although felt less likely.  Also electrolyte abnormalities.  States she will feel her heart tighten up at times also.  EKG reassuring.  Blood work overall reassuring.  Does have a hypokalemia.  Supplemented IV.  Does have oral supplementation at home.  Feeling better.  Discussed with patient and feels that Zofran  helps with her oral intake.  Will give prescription for that.  However appears stable for discharge home and has GI follow-up.     Final diagnoses:  Nausea vomiting and diarrhea  Hypokalemia    ED Discharge Orders          Ordered    ondansetron  (ZOFRAN -ODT) 8 MG disintegrating tablet  Every 8 hours PRN        05/28/24 1422               Patsey Lot, MD 05/28/24 1451

## 2024-05-28 NOTE — ED Triage Notes (Signed)
 C/o nausea, fatigue, headache x 4 days. Hx of GI problems. States was referred by GI.

## 2024-06-02 ENCOUNTER — Encounter: Payer: Self-pay | Admitting: Neurology

## 2024-06-02 ENCOUNTER — Encounter: Payer: Self-pay | Admitting: Family

## 2024-06-02 ENCOUNTER — Ambulatory Visit (INDEPENDENT_AMBULATORY_CARE_PROVIDER_SITE_OTHER): Admitting: Neurology

## 2024-06-02 VITALS — BP 115/79 | HR 69 | Ht 61.0 in | Wt 179.2 lb

## 2024-06-02 DIAGNOSIS — R200 Anesthesia of skin: Secondary | ICD-10-CM

## 2024-06-02 DIAGNOSIS — R202 Paresthesia of skin: Secondary | ICD-10-CM

## 2024-06-02 DIAGNOSIS — G43709 Chronic migraine without aura, not intractable, without status migrainosus: Secondary | ICD-10-CM | POA: Diagnosis not present

## 2024-06-02 DIAGNOSIS — R5383 Other fatigue: Secondary | ICD-10-CM | POA: Diagnosis not present

## 2024-06-02 DIAGNOSIS — R519 Headache, unspecified: Secondary | ICD-10-CM

## 2024-06-02 DIAGNOSIS — E559 Vitamin D deficiency, unspecified: Secondary | ICD-10-CM | POA: Diagnosis not present

## 2024-06-02 DIAGNOSIS — G4719 Other hypersomnia: Secondary | ICD-10-CM

## 2024-06-02 DIAGNOSIS — R0681 Apnea, not elsewhere classified: Secondary | ICD-10-CM

## 2024-06-02 DIAGNOSIS — R419 Unspecified symptoms and signs involving cognitive functions and awareness: Secondary | ICD-10-CM | POA: Diagnosis not present

## 2024-06-02 DIAGNOSIS — E538 Deficiency of other specified B group vitamins: Secondary | ICD-10-CM | POA: Diagnosis not present

## 2024-06-02 DIAGNOSIS — R42 Dizziness and giddiness: Secondary | ICD-10-CM | POA: Diagnosis not present

## 2024-06-02 MED ORDER — ZAVZPRET 10 MG/ACT NA SOLN: 1.0000 | Freq: Every day | NASAL | 0 refills | Status: AC | PRN

## 2024-06-02 MED ORDER — QULIPTA 60 MG PO TABS: 60.0000 mg | ORAL_TABLET | Freq: Every day | ORAL | 11 refills | Status: AC

## 2024-06-02 NOTE — Patient Instructions (Addendum)
 Options:   Restart Ajovy or try Aimovig which is the third self injetor(you tried Emgality and not helpful). Vyepti is an IV infusion every 3 months Qulipta  is a daily pill Botox for migraine  Plan: Sleep referral to sleep doctor to discuss  (fatigue, morning headaches) Blood work today  Migraine Prevention: Plan: Qulipta  is a daily pill for migraine prevention See if we can get botox for migraines approved  Migraine Acute/as needed: Continue Ubrelvy as needed Zavzpret  samples (due to nausea and vomiting)   Zavegepant Nasal Spray What is this medication? ZAVEGEPANT (za VE je pant) treats migraines. It works by blocking a substance in the body that causes migraines. It is not used to prevent migraines. This medicine may be used for other purposes; ask your health care provider or pharmacist if you have questions. COMMON BRAND NAME(S): ZAVZPRET  What should I tell my care team before I take this medication? They need to know if you have any of these conditions: Kidney disease Liver disease An unusual or allergic reaction to zavegepant, other medications, foods, dyes, or preservatives Pregnant or trying to get pregnant Breast-feeding How should I use this medication? This medication is for use in the nose. Take it as directed on the prescription label. Do not use it more often than directed. Make sure that you are using your nasal spray correctly. Ask your care team if you have any questions. Talk to your care team about the use of this medication in children. Special care may be needed. Overdosage: If you think you have taken too much of this medicine contact a poison control center or emergency room at once. NOTE: This medicine is only for you. Do not share this medicine with others. What if I miss a dose? This does not apply. This medication is not for regular use. It should only be used as needed. What may interact with this medication? Decongestant nasal sprays This  medication may affect how other medications work, and other medications may affect the way this medication works. Talk with your care team about all of the medications you take. They may suggest changes to your treatment plan to lower the risk of side effects and to make sure your medications work as intended. This list may not describe all possible interactions. Give your health care provider a list of all the medicines, herbs, non-prescription drugs, or dietary supplements you use. Also tell them if you smoke, drink alcohol, or use illegal drugs. Some items may interact with your medicine. What should I watch for while using this medication? Visit your care team for regular checks on your progress. Tell your care team if your symptoms do not start to get better or if they get worse. What side effects may I notice from receiving this medication? Side effects that you should report to your care team as soon as possible: Allergic reactions--skin rash, itching, hives, swelling of the face, lips, tongue, or throat Side effects that usually do not require medical attention (report to your care team if they continue or are bothersome): Change in taste Dryness or irritation inside the nose Nausea Vomiting This list may not describe all possible side effects. Call your doctor for medical advice about side effects. You may report side effects to FDA at 1-800-FDA-1088. Where should I keep my medication? Keep out of the reach of children and pets. Store at room temperature between 20 and 25 degrees C (68 and 77 degrees F). Do not freeze. Get rid of any unused medication  after the expiration date. To get rid of medications that are no longer needed or have expired: Take the medication to a medication take-back program. Check with your pharmacy or law enforcement to find a location. If you cannot return the medication, ask your pharmacist or care team how to get rid of this medication safely. NOTE: This sheet  is a summary. It may not cover all possible information. If you have questions about this medicine, talk to your doctor, pharmacist, or health care provider.  2024 Elsevier/Gold Standard (2022-01-26 00:00:00)OnabotulinumtoxinA Injection (Medical Use) What is this medication? ONABOTULINUMTOXINA (o na BOTT you lye num tox in eh) treats severe muscle spasms. It may also be used to prevent migraine headaches. It can treat excessive sweating when other medications do not work well enough. This medicine may be used for other purposes; ask your health care provider or pharmacist if you have questions. COMMON BRAND NAME(S): Botox What should I tell my care team before I take this medication? They need to know if you have any of these conditions: Breathing problems Cerebral palsy spasms Difficulty urinating Heart problems History of surgery where this medication is going to be used Infection at the site where this medication is going to be used Myasthenia gravis or other neurologic disease Nerve or muscle disease Surgery plans Take medications that treat or prevent blood clots Thyroid  problems An unusual or allergic reaction to botulinum toxin, albumin, other medications, foods, dyes, or preservatives Pregnant or trying to get pregnant Breast-feeding How should I use this medication? This medication is for injected into a muscle. It is given by your care team in a hospital or clinic setting. A special MedGuide will be given to you before each treatment. Be sure to read this information carefully each time. Talk to your care team about the use of this medication in children. While this medication may be prescribed for children as young as 2 years for selected conditions, precautions do apply. Overdosage: If you think you have taken too much of this medicine contact a poison control center or emergency room at once. NOTE: This medicine is only for you. Do not share this medicine with others. What  if I miss a dose? This does not apply. What may interact with this medication? Aminoglycoside antibiotics, such as gentamicin, neomycin, tobramycin Muscle relaxants Other botulinum toxin injections This list may not describe all possible interactions. Give your health care provider a list of all the medicines, herbs, non-prescription drugs, or dietary supplements you use. Also tell them if you smoke, drink alcohol, or use illegal drugs. Some items may interact with your medicine. What should I watch for while using this medication? Visit your care team for regular check ups. This medication will cause weakness in the muscle where it is injected. Tell your care team if you feel unusually weak in other muscles. Get medical help right away if you have problems with breathing, swallowing, or talking. This medication might make your eyelids droop or make you see blurry or double. If you have weak muscles or trouble seeing do not drive a car, use machinery, or do other dangerous activities. This medication contains albumin from human blood. It may be possible to pass an infection in this medication, but no cases have been reported. Talk to your care team about the risks and benefits of this medication. If your activities have been limited by your condition, go back to your regular routine slowly after treatment with this medication. What side effects may I  notice from receiving this medication? Side effects that you should report to your care team as soon as possible: Allergic reactions--skin rash, itching, hives, swelling of the face, lips, tongue, or throat Dryness or irritation of the eyes, eye pain, change in vision, sensitivity to light Infection--fever, chills, cough, sore throat, wounds that don't heal, pain or trouble when passing urine, general feeling of discomfort or being unwell Spread of botulinum toxin effects--unusual weakness or fatigue, blurry or double vision, trouble swallowing,  hoarseness or trouble speaking, trouble breathing, loss of bladder control Trouble passing urine Side effects that usually do not require medical attention (report these to your care team if they continue or are bothersome): Dry mouth Eyelid drooping Fatigue Headache Pain, redness, or irritation at injection site This list may not describe all possible side effects. Call your doctor for medical advice about side effects. You may report side effects to FDA at 1-800-FDA-1088. Where should I keep my medication? This medication is given in a hospital or clinic and will not be stored at home. NOTE: This sheet is a summary. It may not cover all possible information. If you have questions about this medicine, talk to your doctor, pharmacist, or health care provider.  2024 Elsevier/Gold Standard (2021-10-27 00:00:00) Atogepant  Tablets What is this medication? ATOGEPANT  (a TOE je pant) prevents migraines. It works by blocking a substance in the body that causes migraines. This medicine may be used for other purposes; ask your health care provider or pharmacist if you have questions. COMMON BRAND NAME(S): QULIPTA  What should I tell my care team before I take this medication? They need to know if you have any of these conditions: Kidney disease Liver disease An unusual or allergic reaction to atogepant , other medications, foods, dyes, or preservatives Pregnant or trying to get pregnant Breast-feeding How should I use this medication? Take this medication by mouth with water. Take it as directed on the prescription label at the same time every day. You can take it with or without food. If it upsets your stomach, take it with food. Keep taking it unless your care team tells you to stop. Talk to your care team about the use of this medication in children. Special care may be needed. Overdosage: If you think you have taken too much of this medicine contact a poison control center or emergency room at  once. NOTE: This medicine is only for you. Do not share this medicine with others. What if I miss a dose? If you miss a dose, take it as soon as you can. If it is almost time for your next dose, take only that dose. Do not take double or extra doses. What may interact with this medication? Carbamazepine Certain medications for fungal infections, such as itraconazole, ketoconazole Clarithromycin Cyclosporine Efavirenz Etravirine Phenytoin Rifampin St. John's wort This list may not describe all possible interactions. Give your health care provider a list of all the medicines, herbs, non-prescription drugs, or dietary supplements you use. Also tell them if you smoke, drink alcohol, or use illegal drugs. Some items may interact with your medicine. What should I watch for while using this medication? Visit your care team for regular checks on your progress. Tell your care team if your symptoms do not start to get better or if they get worse. What side effects may I notice from receiving this medication? Side effects that you should report to your care team as soon as possible: Allergic reactions--skin rash, itching, hives, swelling of the face, lips, tongue,  or throat Side effects that usually do not require medical attention (report to your care team if they continue or are bothersome): Constipation Fatigue Loss of appetite with weight loss Nausea This list may not describe all possible side effects. Call your doctor for medical advice about side effects. You may report side effects to FDA at 1-800-FDA-1088. Where should I keep my medication? Keep out of the reach of children and pets. Store at room temperature between 20 and 25 degrees C (68 and 77 degrees F). Get rid of any unused medication after the expiration date. To get rid of medications that are no longer needed or have expired: Take the medication to a medication take-back program. Check with your pharmacy or law enforcement to  find a location. If you cannot return the medication, check the label or package insert to see if the medication should be thrown out in the garbage or flushed down the toilet. If you are not sure, ask your care team. If it is safe to put it in the trash, take the medication out of the container. Mix the medication with cat litter, dirt, coffee grounds, or other unwanted substance. Seal the mixture in a bag or container. Put it in the trash. NOTE: This sheet is a summary. It may not cover all possible information. If you have questions about this medicine, talk to your doctor, pharmacist, or health care provider.  2024 Elsevier/Gold Standard (2021-12-19 00:00:00)

## 2024-06-02 NOTE — Progress Notes (Unsigned)
 [facility]    Provider:  Dr Ines Requesting Provider: Corwin Antu, FNP Primary Care Provider:  Corwin Antu, FNP  CC:  Migraines  HPI:  Alison Clarke is a 43 y.o. female here as requested by Corwin Antu, FNP for migraine. has Collagenous colitis; Bilateral nephrolithiasis; IUD (intrauterine device) in place; GAD (generalized anxiety disorder); Chronic migraine without aura without status migrainosus, not intractable; Acquired hypothyroidism; Concentration deficit; Hypokalemia; Vitamin D  deficiency; Hyperglycemia; Low serum vitamin B12; Seasonal allergic rhinitis due to pollen; Vitiligo; Burping; Generalized abdominal pain; Diarrhea of presumed infectious origin; and Bilious vomiting with nausea on their problem list.  I reviewed notes, she saw Francis Skelton on February 07, 2024 when she was referred to neurology for chronic migraines, it appears she has chronic hypokalemia on potassium due to generalized abdominal pain diarrhea colitis, she was being [facility] for [facility] and her insurance was no longer covered, Emgality was ineffective, neurology ordered MRI of the brain and MRI of the head due to new headache symptoms change and change in frequency of headache, she was tried on propranolol 60 mg and taking Ajovy once monthly, Ubrelvy, MRI of the brain October 2023 showed no acute etiologies, she does have a history of migraine without aura since age of 77 and a history of meningitis, have been having headaches 5 out of 30 days a month and Ajovy helping however headaches increased to 15 out of 30 days and changed in symptoms as well as now right sided in the prior were left.  Reviewed last neurology notes from [facility] Dr. Wendelyn Gums neurology, she asked for vitamin D , B12, ferritin, TSH and T4 levels for patient in March 2025 I do not see where those have been completed will order today.  An MRI of the brain and MRA of the head neck were  ordered December 27, 2023 I do not see that those have been completed.  For you semiology and changes in headache frequency.  She has been focusing on her GI issues, she has a lot of vomiting and low K+ she may have colitis, she is seeing a new GI doctor. She was going to [facility] for her migraines and last was in February when they ordered the MRIs, her pcp reordered and they are approved and pending, she is going to call the imaging to schedule (had to cancel twice due to illness) but going to be scheduled and has been approved. The Ajovy helped a lot, she tried emgality and was not as effective as the Ajovy. She feels the Ajovy had some improvement however near the end of the Ajovy her migraines worsened. She has chronic GI prolems. The vomiting started n April, she has an upper GI scheduled with a colonoscopy on the 6th at [facility]. Her migraines unilateral, can switch sides, worsening recently in severity and frequency, nausea, vomiting, pulsating/pounding/throbbing, no aura, no medication overuse, hurts to move, has right eye lacrimation and rhinorrhea, migraines are moderate to severe, she wakes up with morning headaches, wakes up choking every so often and can;t breatj and excessive daytime fatigue, about 3-4 she wants a nap.    Reviewed notes, labs and imaging from outside physicians, which showed:  From a thorough review of records and patient report, Medications tried that can be used in migraine/headache management greater than 3 months include: Lifestyle modification, headache diaries, better sleep hygiene, exercise, management of migraine triggers, OTC and prescribed analgesics/nsaids such as ibuprofen , excedrin, alleve and others,  Flexeril , Benadryl , Cymbalta, gabapentin, Lamictal , Phenergan , propranolol IR and extended release, Topamax, trazodone, Effexor , avoid Topamax due to kidney stones, avoid amitriptyline due to interaction with Lamictal  so Topamax and Lamictal  are contraindicated.   Emgality was ineffective.  Propranolol with side effects including reduced exercise tolerance reduced resting heart rate worsening of asthma.  Ajovy currently.  Labs 05/28/2024: Mag nml, cbc nml, cmp with low K+ 3.1 and anion gap 16 otherwise nml,  05/19/2024 b12, tsh ordered for future  MRI brain 08/23/2022: Reviewed report was completed at [facility] and I do not have access to those images, normal  EXAM:  MRI HEAD WITHOUT AND WITH CONTRAST   TECHNIQUE:  Multiplanar, multiecho pulse sequences of the brain and surrounding  structures were obtained without and with intravenous contrast.   CONTRAST:  7.8 cc Gadavist   COMPARISON:  CT head 09/07/2005   FINDINGS:  Brain: There is no acute intracranial hemorrhage, extra-axial fluid  collection, or acute infarct.   Parenchymal volume is normal. The ventricles are normal in size.  Gray-white differentiation is preserved. Parenchymal signal is  normal.   The pituitary and other midline structures are normal.   There is no abnormal enhancement. There is no mass lesion. There is  no mass effect or midline shift.   Vascular: Normal flow voids.   Skull and upper cervical spine: Normal marrow signal.   Sinuses/Orbits: There is mild mucosal thickening in the maxillary  sinuses with a small mucous retention cyst on the left. The globes  and orbits are unremarkable. Optic nerves are grossly unremarkable.   Other: None.   IMPRESSION:  Normal appearance of the brain.   Review of Systems: Patient complains of symptoms per HPI as well as the following symptoms none. Pertinent negatives and positives per HPI. All others negative.   Social History   Socioeconomic History   Marital status: Significant Other    Spouse name: Not on file   Number of children: 1   Years of education: Not on file   Highest education level: Associate degree: occupational, Scientist, product/process development, or vocational program  Occupational History   Occupation: real estate agent   Tobacco Use   Smoking status: Never   Smokeless tobacco: Never  Vaping Use   Vaping status: Never Used  Substance and Sexual Activity   Alcohol use: Yes    Alcohol/week: 0.0 standard drinks of alcohol    Comment: rare   Drug use: No   Sexual activity: Yes    Partners: Male    Birth control/protection: I.U.D.  Other Topics Concern   Not on file  Social History Narrative   Not on file   Social Drivers of Health   Financial Resource Strain: Low Risk  (12/31/2023)   Overall Financial Resource Strain (CARDIA)    Difficulty of Paying Living Expenses: Not hard at all  Food Insecurity: No Food Insecurity (12/31/2023)   Hunger Vital Sign    Worried About Running Out of Food in the Last Year: Never true    Ran Out of Food in the Last Year: Never true  Transportation Needs: No Transportation Needs (12/31/2023)   PRAPARE - Administrator, Civil Service (Medical): No    Lack of Transportation (Non-Medical): No  Physical Activity: Unknown (12/31/2023)   Exercise Vital Sign    Days of Exercise per Week: Patient declined    Minutes of Exercise per Session: Not on file  Stress: No Stress Concern Present (12/31/2023)   [employer] of Occupational Health - Occupational  Stress Questionnaire    Feeling of Stress : Only a little  Social Connections: Unknown (12/31/2023)   Social Connection and Isolation Panel    Frequency of Communication with Friends and Family: More than three times a week    Frequency of Social Gatherings with Friends and Family: Once a week    Attends Religious Services: Patient declined    Active Member of Clubs or Organizations: No    Attends Engineer, structural: Not on file    Marital Status: Divorced  Intimate Partner Violence: Not At Risk (02/27/2024)   Received from [facility]   Domestic Partner Violence    Do you feel safe in your home?: Unrecognized value    Do you feel safe in your current relationship?: Unrecognized value     Family History  Problem Relation Age of Onset   Hypertension Mother    Hypertension Father    Liver cancer Father    Alcohol abuse Father    Cirrhosis Father    Migraines Father    Uterine cancer Sister    Brain cancer Maternal Uncle    Breast cancer Paternal Aunt    Colon cancer Neg Hx    Esophageal cancer Neg Hx    Rectal cancer Neg Hx     Past Medical History:  Diagnosis Date   Allergic rhinitis    Anxiety    Asthma    Colitis    Collagenous colitis 02/25/2015   02/2015 colonoscopy   Depression    Heart murmur    past hx of but not now    Hypothyroidism    IBS (irritable bowel syndrome)    Kidney stones    Meningitis    age 67 -   Scarlet fever    Shingles     Patient Active Problem List   Diagnosis Date Noted   Burping 03/05/2024   Generalized abdominal pain 03/05/2024   Diarrhea of presumed infectious origin 03/05/2024   Bilious vomiting with nausea 03/05/2024   Acquired hypothyroidism 04/03/2023   Concentration deficit 04/03/2023   Hypokalemia 04/03/2023   Vitamin D  deficiency 04/03/2023   Hyperglycemia 04/03/2023   Low serum vitamin B12 04/03/2023   Seasonal allergic rhinitis due to pollen 04/03/2023   Vitiligo 04/03/2023   GAD (generalized anxiety disorder) 03/06/2023   Chronic migraine without aura without status migrainosus, not intractable 08/10/2022   Bilateral nephrolithiasis 10/23/2019   IUD (intrauterine device) in place 10/23/2019   Collagenous colitis 02/25/2015    Past Surgical History:  Procedure Laterality Date   COLONOSCOPY     LITHOTRIPSY     TONSILLECTOMY     WISDOM TOOTH EXTRACTION      Current Outpatient Medications  Medication Sig Dispense Refill   Atogepant  (QULIPTA ) 60 MG TABS Take 1 tablet (60 mg total) by mouth daily. 30 tablet 11   Azelastine -Fluticasone  137-50 MCG/ACT SUSP SPRAY 1 SPRAY IN EACH NOSTRIL EVERY 12 HOURS 23 g 5   cetirizine (ZYRTEC) 10 MG tablet Take 10 mg by mouth daily.     clindamycin  (CLEOCIN -T)  1 % lotion Apply topically daily. 60 mL 5   lamoTRIgine  (LAMICTAL ) 25 MG tablet TAKE ONE TABLET BY MOUTH EVERY EVENING 30 tablet 2   levonorgestrel  (MIRENA , 52 MG,) 20 MCG/24HR IUD 1 each by Intrauterine route once.     levothyroxine  (SYNTHROID ) 88 MCG tablet Take 1 tablet (88 mcg total) by mouth daily. 90 tablet 3   ondansetron  (ZOFRAN ) 4 MG tablet Take 1 tablet (4 mg total) by  mouth every 6 (six) hours. 12 tablet 0   ondansetron  (ZOFRAN -ODT) 8 MG disintegrating tablet Take 1 tablet (8 mg total) by mouth every 8 (eight) hours as needed for nausea or vomiting. 18 tablet 0   potassium chloride  (KLOR-CON ) 10 MEQ tablet Take 10 mEq by mouth daily.     tretinoin  (RETIN-A ) 0.025 % cream Apply topically at bedtime. 45 g 4   UBRELVY 100 MG TABS Take by mouth.     venlafaxine  XR (EFFEXOR -XR) 150 MG 24 hr capsule Take 1 capsule (150 mg total) by mouth daily. 90 capsule 3   XIFAXAN  550 MG TABS tablet Take 550 mg by mouth 3 (three) times daily.     Zavegepant HCl (ZAVZPRET ) 10 MG/ACT SOLN Place 1 spray into the nose daily as needed. 8 each 0   No current facility-administered medications for this visit.    Allergies as of 06/02/2024 - Review Complete 05/28/2024  Allergen Reaction Noted   Sulfur Anaphylaxis 04/03/2023   Doxycycline Nausea And Vomiting 07/19/2018   Elemental sulfur Hives 12/07/2014   Hydrocodone-acetaminophen  Other (See Comments) 02/22/2015   Morphine  and codeine Nausea And Vomiting 02/22/2015   Vicodin [hydrocodone-acetaminophen ] Nausea And Vomiting 02/22/2015   Compazine  [prochlorperazine ] Anxiety 12/04/2019    Vitals: BP 115/79   Pulse 69   Ht 5' 1 (1.549 m)   Wt 179 lb 3.2 oz (81.3 kg)   BMI 33.86 kg/m  Last Weight:  Wt Readings from Last 1 Encounters:  06/02/24 179 lb 3.2 oz (81.3 kg)   Last Height:   Ht Readings from Last 1 Encounters:  06/02/24 5' 1 (1.549 m)     Physical exam: Exam: Gen: NAD, conversant, well nourised, obese, well groomed                      CV: RRR, no MRG. No Carotid Bruits. No peripheral edema, warm, nontender Eyes: Conjunctivae clear without exudates or hemorrhage  Neuro: Detailed Neurologic Exam  Speech:    Speech is normal; fluent and spontaneous with normal comprehension.  Cognition:    The patient is oriented to person, place, and time;     recent and remote memory intact;     language fluent;     normal attention, concentration,     fund of knowledge Cranial Nerves:    The pupils are equal, round, and reactive to light. The fundi are normal and spontaneous venous pulsations are present. Visual fields are full to finger confrontation. Extraocular movements are intact. Trigeminal sensation is intact and the muscles of mastication are normal. The face is symmetric. The palate elevates in the midline. Hearing intact. Voice is normal. Shoulder shrug is normal. The tongue has normal motion without fasciculations.   Coordination: Normal   Gait: Normal   Motor Observation:    No asymmetry, no atrophy, and no involuntary movements noted. Tone:    Normal muscle tone.    Posture:    Posture is normal. normal erect    Strength:    Strength is V/V in the upper and lower limbs.      Sensation: intact to LT     Reflex Exam:  DTR's:    Deep tendon reflexes in the upper and lower extremities are normal bilaterally.   Toes:    The toes are downgoing bilaterally.   Clonus:    Clonus is absent.    Assessment/Plan:  Patient with chronic migraines; Discussed Options to Restart Ajovy or try Aimovig which is the third self injetor(you  tried [employer] and not helpful), Vyepti is an IV infusion every 3 months, Qulipta  is a daily pill, Botox for migraine  Reviewed last neurology notes from [facility] Dr. Wendelyn Gums neurology, she asked for vitamin D , B12, TSH and T4 levels for patient in March 2025 I do not see where those have been completed will order today.   Pending MRI of the brain and MRA of the head neck were  ordered for semiology changes in headache frequency, she is going to schedule   Plan: Sleep referral to sleep doctor to discuss  (fatigue, morning headaches, wakes up choking every so often and can't breath, excessive daytime fatigue, about 3-4pm she wants a nap, ESS10. ) Blood work today Migraine Prevention: Qulipta  is a daily pill for migraine prevention; See if we can get botox for migraines approved Migraine Acute/as needed: Continue Ubrelvy as needed, Zavzpret  samples (due to nausea and vomiting)   Orders Placed This Encounter  Procedures   Vitamin D , 25-hydroxy   TSH Rfx on Abnormal to Free T4   B12 and Folate Panel   Methylmalonic acid, serum   Basic Metabolic Panel   U5Q   Ambulatory referral to Sleep Studies   Meds ordered this encounter  Medications   Atogepant  (QULIPTA ) 60 MG TABS    Sig: Take 1 tablet (60 mg total) by mouth daily.    Dispense:  30 tablet    Refill:  11   Zavegepant HCl (ZAVZPRET ) 10 MG/ACT SOLN    Sig: Place 1 spray into the nose daily as needed.    Dispense:  8 each    Refill:  0    Cc: Corwin Antu, FNP,  Dugal, Tabitha, FNP  Onetha Epp, MD  [facility] [address] [place], [place] [address]  Phone [phone] Fax [phone]

## 2024-06-03 ENCOUNTER — Encounter: Payer: Self-pay | Admitting: Neurology

## 2024-06-03 LAB — TSH RFX ON ABNORMAL TO FREE T4: TSH: 5 u[IU]/mL — ABNORMAL HIGH (ref 0.450–4.500)

## 2024-06-03 LAB — T4F: T4,Free (Direct): 1.09 ng/dL (ref 0.82–1.77)

## 2024-06-03 NOTE — Telephone Encounter (Signed)
 I sent you a message yesterday to cancel the MRI but if you have not already please leave alone. Pt has been sick on and off and hasn't been able to make it, she just got back to me today.

## 2024-06-04 ENCOUNTER — Telehealth: Payer: Self-pay | Admitting: *Deleted

## 2024-06-04 ENCOUNTER — Ambulatory Visit: Payer: Self-pay | Admitting: Neurology

## 2024-06-04 ENCOUNTER — Telehealth: Payer: Self-pay | Admitting: Neurology

## 2024-06-04 DIAGNOSIS — G43709 Chronic migraine without aura, not intractable, without status migrainosus: Secondary | ICD-10-CM

## 2024-06-04 NOTE — Telephone Encounter (Signed)
 Duplicate encounter

## 2024-06-04 NOTE — Telephone Encounter (Signed)
-----   Message from Onetha KATHEE Epp sent at 06/03/2024  9:38 PM EDT ----- Regarding: g43.709 Please start botox protocol, g43.709, you can schedule her with NP first available thank you

## 2024-06-04 NOTE — Telephone Encounter (Signed)
Chronic Migraine CPT 64615    Botox J0585 Units:155 units   G43.709 Chronic Migraine without aura, not intractable, without status migrainous

## 2024-06-04 NOTE — Telephone Encounter (Signed)
 Submitted auth request via CMM, status is pending. Key: [id number]

## 2024-06-04 NOTE — Telephone Encounter (Signed)
 New start

## 2024-06-05 LAB — VITAMIN D 25 HYDROXY (VIT D DEFICIENCY, FRACTURES): Vit D, 25-Hydroxy: 24.3 ng/mL — ABNORMAL LOW (ref 30.0–100.0)

## 2024-06-05 LAB — BASIC METABOLIC PANEL WITH GFR
BUN/Creatinine Ratio: 10 (ref 9–23)
BUN: 9 mg/dL (ref 6–24)
CO2: 22 mmol/L (ref 20–29)
Calcium: 9.3 mg/dL (ref 8.7–10.2)
Chloride: 99 mmol/L (ref 96–106)
Creatinine, Ser: 0.93 mg/dL (ref 0.57–1.00)
Glucose: 77 mg/dL (ref 70–99)
Potassium: 4 mmol/L (ref 3.5–5.2)
Sodium: 137 mmol/L (ref 134–144)
eGFR: 77 mL/min/1.73 (ref >59)

## 2024-06-05 LAB — METHYLMALONIC ACID, SERUM: Methylmalonic Acid: 110 nmol/L (ref 0–378)

## 2024-06-05 LAB — B12 AND FOLATE PANEL
Folate: 14.5 ng/mL (ref >3.0)
Vitamin B-12: 524 pg/mL (ref 232–1245)

## 2024-06-06 ENCOUNTER — Encounter: Payer: Self-pay | Admitting: Family

## 2024-06-06 ENCOUNTER — Encounter: Payer: Self-pay | Admitting: Neurology

## 2024-06-06 DIAGNOSIS — R110 Nausea: Secondary | ICD-10-CM

## 2024-06-06 NOTE — Telephone Encounter (Signed)
 I would not feel comfortable for her to use that many zofran   ondansetron  (ZOFRAN -ODT) 8 MG disintegrating tablet 8 mg, Every 8 hours PRN 0 ordered       Summary: Take 1 tablet (8 mg total) by mouth every 8 (eight) hours as needed for nausea or vomiting., Starting Wed 05/28/2024, Normal Dose, Route, Frequency: 8 mg, Oral, Every 8 hours PRNStart: 07/16/2025Ordered On: 07/16/2025[id number] [place] [place] [address] Dx Associated: Taking: Long-term: Med Note:    Directions: Take 1 tablet (8 mg total) by mouth every 8 (eight) hours as needed for nausea or vomiting. Ordering Department: [facility] Authorized By: Patsey Lot, MD Dispense: 18 tablet    ondansetron  (ZOFRAN ) 4 MG tablet 4 mg, Every 6 hours 0 ordered       Summary: Take 1 tablet (4 mg total) by mouth every 6 (six) hours., Starting Thu 04/24/2024, Normal Dose, Route, Frequency: 4 mg, Oral, Every 6 hoursStart: 06/12/2025Ordered On: 06/12/2025[facility] [id number] [place], [place] - [address]: Dx Associated: Taking: Long-term: Med Note:    Directions: Take 1 tablet (4 mg total) by mouth every 6 (six) hours. Ordering Department: [facility] Authorized By: Lang Norleen POUR, PA-C Dispense: 12 tablet    Hx of colitis  Suggest her GI physician to handle above complains. MRI brain pending

## 2024-06-10 ENCOUNTER — Other Ambulatory Visit: Payer: Self-pay | Admitting: Neurology

## 2024-06-12 DIAGNOSIS — K648 Other hemorrhoids: Secondary | ICD-10-CM | POA: Diagnosis not present

## 2024-06-12 DIAGNOSIS — K52831 Collagenous colitis: Secondary | ICD-10-CM | POA: Diagnosis not present

## 2024-06-12 DIAGNOSIS — K449 Diaphragmatic hernia without obstruction or gangrene: Secondary | ICD-10-CM | POA: Diagnosis not present

## 2024-06-12 DIAGNOSIS — Z8719 Personal history of other diseases of the digestive system: Secondary | ICD-10-CM | POA: Diagnosis not present

## 2024-06-12 DIAGNOSIS — K208 Other esophagitis without bleeding: Secondary | ICD-10-CM | POA: Diagnosis not present

## 2024-06-12 DIAGNOSIS — K2960 Other gastritis without bleeding: Secondary | ICD-10-CM | POA: Diagnosis not present

## 2024-06-12 DIAGNOSIS — R112 Nausea with vomiting, unspecified: Secondary | ICD-10-CM | POA: Diagnosis not present

## 2024-06-12 DIAGNOSIS — Z09 Encounter for follow-up examination after completed treatment for conditions other than malignant neoplasm: Secondary | ICD-10-CM | POA: Diagnosis not present

## 2024-06-12 DIAGNOSIS — R12 Heartburn: Secondary | ICD-10-CM | POA: Diagnosis not present

## 2024-06-12 MED ORDER — ONABOTULINUMTOXINA 200 UNITS IJ SOLR: INTRAMUSCULAR | 3 refills | Status: AC

## 2024-06-12 NOTE — Telephone Encounter (Signed)
Botox 200 unit Rx sent to [facility]

## 2024-06-12 NOTE — Addendum Note (Signed)
 Addended by: HILLIARD HEATHER CROME on: 06/12/2024 11:33 AM   Modules accepted: Orders

## 2024-06-12 NOTE — Telephone Encounter (Signed)
 Received approval from [facility], please send rx to [facility].  Auth#: [id number] (06/05/24-12/05/24)

## 2024-06-12 NOTE — Telephone Encounter (Signed)
 Scheduled for 10/6 @ 3:45 pm with Lauraine.

## 2024-06-16 ENCOUNTER — Encounter: Payer: Self-pay | Admitting: Dermatology

## 2024-06-24 ENCOUNTER — Other Ambulatory Visit (HOSPITAL_BASED_OUTPATIENT_CLINIC_OR_DEPARTMENT_OTHER): Payer: Self-pay | Admitting: Family

## 2024-06-24 DIAGNOSIS — Z1231 Encounter for screening mammogram for malignant neoplasm of breast: Secondary | ICD-10-CM

## 2024-06-25 ENCOUNTER — Encounter: Payer: Self-pay | Admitting: Dermatology

## 2024-06-25 ENCOUNTER — Ambulatory Visit (INDEPENDENT_AMBULATORY_CARE_PROVIDER_SITE_OTHER): Admitting: Dermatology

## 2024-06-25 VITALS — BP 111/80 | HR 71

## 2024-06-25 DIAGNOSIS — D485 Neoplasm of uncertain behavior of skin: Secondary | ICD-10-CM

## 2024-06-25 DIAGNOSIS — D492 Neoplasm of unspecified behavior of bone, soft tissue, and skin: Secondary | ICD-10-CM

## 2024-06-25 DIAGNOSIS — L080 Pyoderma: Secondary | ICD-10-CM

## 2024-06-25 DIAGNOSIS — L720 Epidermal cyst: Secondary | ICD-10-CM | POA: Diagnosis not present

## 2024-06-25 NOTE — Progress Notes (Signed)
   Follow-Up Visit   Subjective  Alison Clarke is a 43 y.o. female who presents for the following: recheck spot on face. Lesion on right cheek still present, Itchy at times.   Cyst on back is currently inflamed and tender.   The following portions of the chart were reviewed this encounter and updated as appropriate: medications, allergies, medical history  Review of Systems:  No other skin or systemic complaints except as noted in HPI or Assessment and Plan.  Objective  Well appearing patient in no apparent distress; mood and affect are within normal limits.   A focused examination was performed of the following areas: face  Relevant exam findings are noted in the Assessment and Plan.  Right Malar Cheek 6mm pink scaly papule   Mid Back Probable cyst, return for excision to remove fully.   Assessment & Plan   EPIDERMAL INCLUSION CYST Exam: Subcutaneous nodule at back  Benign-appearing. Exam most consistent with an epidermal inclusion cyst. Discussed that a cyst is a benign growth that can grow over time and sometimes get irritated or inflamed. Recommend observation if it is not bothersome. Discussed option of surgical excision to remove it if it is growing, symptomatic, or other changes noted. Please call for new or changing lesions so they can be evaluated.  NEOPLASM OF UNCERTAIN BEHAVIOR OF SKIN (2) Right Malar Cheek Skin / nail biopsy Type of biopsy: tangential   Informed consent: discussed and consent obtained   Timeout: patient name, date of birth, surgical site, and procedure verified   Procedure prep:  Patient was prepped and draped in usual sterile fashion Prep type:  Isopropyl alcohol Anesthesia: the lesion was anesthetized in a standard fashion   Anesthetic:  1% lidocaine  w/ epinephrine 1-100,000 buffered w/ 8.4% NaHCO3 Instrument used: DermaBlade   Hemostasis achieved with: aluminum chloride   Outcome: patient tolerated procedure well   Post-procedure  details: sterile dressing applied and wound care instructions given   Dressing type: petrolatum gauze and bandage    Specimen 1 - Surgical pathology Differential Diagnosis: r/o NMSC vs other  Check Margins: No Mid Back  Return for return for excision of cyst on back.  I, Berwyn Lesches, Surg Tech III, am acting as scribe for RUFUS CHRISTELLA HOLY, MD.   Documentation: I have reviewed the above documentation for accuracy and completeness, and I agree with the above.  RUFUS CHRISTELLA HOLY, MD

## 2024-06-25 NOTE — Patient Instructions (Signed)
 Important Information  Due to recent changes in healthcare laws, you may see results of your pathology and/or laboratory studies on MyChart before the doctors have had a chance to review them. We understand that in some cases there may be results that are confusing or concerning to you. Please understand that not all results are received at the same time and often the doctors may need to interpret multiple results in order to provide you with the best plan of care or course of treatment. Therefore, we ask that you please give us  2 business days to thoroughly review all your results before contacting the office for clarification. Should we see a critical lab result, you will be contacted sooner.   If You Need Anything After Your Visit  If you have any questions or concerns for your doctor, please call our main line at [phone] If no one answers, please leave a voicemail as directed and we will return your call as soon as possible. Messages left after 4 pm will be answered the following business day.   You may also send us  a message via MyChart. We typically respond to MyChart messages within 1-2 business days.  For prescription refills, please ask your pharmacy to contact our office. Our fax number is [phone].  If you have an urgent issue when the clinic is closed that cannot wait until the next business day, you can page your doctor at the number below.    Please note that while we do our best to be available for urgent issues outside of office hours, we are not available 24/7.   If you have an urgent issue and are unable to reach us , you may choose to seek medical care at your doctor's office, retail clinic, urgent care center, or emergency room.  If you have a medical emergency, please immediately call 911 or go to the emergency department. In the event of inclement weather, please call our main line at [phone] for an update on the status of any delays or  closures.  Dermatology Medication Tips: Please keep the boxes that topical medications come in in order to help keep track of the instructions about where and how to use these. Pharmacies typically print the medication instructions only on the boxes and not directly on the medication tubes.   If your medication is too expensive, please contact our office at [phone] or send us  a message through MyChart.   We are unable to tell what your co-pay for medications will be in advance as this is different depending on your insurance coverage. However, we may be able to find a substitute medication at lower cost or fill out paperwork to get insurance to cover a needed medication.   If a prior authorization is required to get your medication covered by your insurance company, please allow us  1-2 business days to complete this process.  Drug prices often vary depending on where the prescription is filled and some pharmacies may offer cheaper prices.  The website www.goodrx.com contains coupons for medications through different pharmacies. The prices here do not account for what the cost may be with help from insurance (it may be cheaper with your insurance), but the website can give you the price if you did not use any insurance.  - You can print the associated coupon and take it with your prescription to the pharmacy.  - You may also stop by our office during regular business hours and pick up a GoodRx coupon card.  - If  you need your prescription sent electronically to a different pharmacy, notify our office through [facility] or by phone at [phone]    Skin Education :   I counseled the patient regarding the following: Sun screen (SPF 30 or greater) should be applied during peak UV exposure (between 10am and 2pm) and reapplied after exercise or swimming.  The ABCDEs of melanoma were reviewed with the patient, and the importance of monthly self-examination of moles was emphasized.  Should any moles change in shape or color, or itch, bleed or burn, pt will contact our office for evaluation sooner then their interval appointment.  Plan: Sunscreen Recommendations I recommended a broad spectrum sunscreen with a SPF of 30 or higher. I explained that SPF 30 sunscreens block approximately 97 percent of the sun's harmful rays. Sunscreens should be applied at least 15 minutes prior to expected sun exposure and then every 2 hours after that as long as sun exposure continues. If swimming or exercising sunscreen should be reapplied every 45 minutes to an hour after getting wet or sweating. One ounce, or the equivalent of a shot glass full of sunscreen, is adequate to protect the skin not covered by a bathing suit. I also recommended a lip balm with a sunscreen as well. Sun protective clothing can be used in lieu of sunscreen but must be worn the entire time you are exposed to the sun's rays.  Patient Handout: Wound Care for Skin Biopsy Site  Taking Care of Your Skin Biopsy Site  Proper care of the biopsy site is essential for promoting healing and minimizing scarring. This handout provides instructions on how to care for your biopsy site to ensure optimal recovery.  1. Cleaning the Wound:  Clean the biopsy site daily with gentle soap and water. Gently pat the area dry with a clean, soft towel. Avoid harsh scrubbing or rubbing the area, as this can irritate the skin and delay healing.  2. Applying Aquaphor and Bandage:  After cleaning the wound, apply a thin layer of Aquaphor ointment to the biopsy site. Cover the area with a sterile bandage to protect it from dirt, bacteria, and friction. Change the bandage daily or as needed if it becomes soiled or wet.  3. Continued Care for One Week:  Repeat the cleaning, Aquaphor application, and bandaging process daily for one week following the biopsy procedure. Keeping the wound clean and moist during this initial healing period will help  prevent infection and promote optimal healing.  4. Massaging Aquaphor into the Area:  ---After one week, discontinue the use of bandages but continue to apply Aquaphor to the biopsy site. ----Gently massage the Aquaphor into the area using circular motions. ---Massaging the skin helps to promote circulation and prevent the formation of scar tissue.   Additional Tips:  Avoid exposing the biopsy site to direct sunlight during the healing process, as this can cause hyperpigmentation or worsen scarring. If you experience any signs of infection, such as increased redness, swelling, warmth, or drainage from the wound, contact your healthcare provider immediately. Follow any additional instructions provided by your healthcare provider for caring for the biopsy site and managing any discomfort. Conclusion:  Taking proper care of your skin biopsy site is crucial for ensuring optimal healing and minimizing scarring. By following these instructions for cleaning, applying Aquaphor, and massaging the area, you can promote a smooth and successful recovery. If you have any questions or concerns about caring for your biopsy site, don't hesitate to contact your healthcare  provider for guidance.

## 2024-06-27 ENCOUNTER — Other Ambulatory Visit: Payer: Self-pay | Admitting: Family

## 2024-06-27 ENCOUNTER — Inpatient Hospital Stay (HOSPITAL_BASED_OUTPATIENT_CLINIC_OR_DEPARTMENT_OTHER): Admission: RE | Admit: 2024-06-27 | Source: Ambulatory Visit | Admitting: Radiology

## 2024-06-27 DIAGNOSIS — Z1231 Encounter for screening mammogram for malignant neoplasm of breast: Secondary | ICD-10-CM

## 2024-06-27 DIAGNOSIS — F422 Mixed obsessional thoughts and acts: Secondary | ICD-10-CM

## 2024-06-27 DIAGNOSIS — F39 Unspecified mood [affective] disorder: Secondary | ICD-10-CM

## 2024-06-27 LAB — SURGICAL PATHOLOGY

## 2024-06-29 ENCOUNTER — Ambulatory Visit: Payer: Self-pay | Admitting: Dermatology

## 2024-06-30 ENCOUNTER — Other Ambulatory Visit: Payer: Self-pay | Admitting: *Deleted

## 2024-06-30 DIAGNOSIS — F422 Mixed obsessional thoughts and acts: Secondary | ICD-10-CM

## 2024-06-30 DIAGNOSIS — F39 Unspecified mood [affective] disorder: Secondary | ICD-10-CM

## 2024-06-30 MED ORDER — LAMOTRIGINE 25 MG PO TABS: 25.0000 mg | ORAL_TABLET | Freq: Every evening | ORAL | 2 refills | Status: DC

## 2024-07-09 ENCOUNTER — Other Ambulatory Visit (HOSPITAL_BASED_OUTPATIENT_CLINIC_OR_DEPARTMENT_OTHER): Payer: Self-pay

## 2024-07-09 ENCOUNTER — Ambulatory Visit (HOSPITAL_BASED_OUTPATIENT_CLINIC_OR_DEPARTMENT_OTHER): Payer: Self-pay | Admitting: Orthopaedic Surgery

## 2024-07-09 ENCOUNTER — Ambulatory Visit (HOSPITAL_BASED_OUTPATIENT_CLINIC_OR_DEPARTMENT_OTHER): Admitting: Orthopaedic Surgery

## 2024-07-09 DIAGNOSIS — M6751 Plica syndrome, right knee: Secondary | ICD-10-CM

## 2024-07-09 MED ORDER — ASPIRIN 325 MG PO TBEC
325.0000 mg | DELAYED_RELEASE_TABLET | Freq: Every day | ORAL | 0 refills | Status: AC
  Filled 2024-07-09: qty 14, 14d supply, fill #0

## 2024-07-09 MED ORDER — OXYCODONE HCL 5 MG PO TABS
5.0000 mg | ORAL_TABLET | ORAL | 0 refills | Status: DC | PRN
  Filled 2024-07-09: qty 5, 1d supply, fill #0

## 2024-07-09 MED ORDER — ACETAMINOPHEN 500 MG PO TABS
500.0000 mg | ORAL_TABLET | Freq: Three times a day (TID) | ORAL | 0 refills | Status: AC
  Filled 2024-07-09: qty 30, 10d supply, fill #0

## 2024-07-09 NOTE — Progress Notes (Signed)
 Chief Complaint: Right knee pain left ankle pain     History of Present Illness:   07/09/2024: Presents today for follow-up predominantly of the right knee.  She did get 1 day of relief from her injection in the knee  Alison Clarke is a 43 y.o. female right knee pain which has been ongoing since she has been 14.  She states since this time she has noticed clicking popping under the kneecap with sitting longer periods of time.  She has been currently experiencing swelling in the knee.  With regard to the right ankle she is experiencing tenderness about the posterior lateral aspect of the ankle with a forcible click and pop around this area.  She does have a history distantly of ankle sprain.  Since this time she has had persistent posterior lateral ankle pain.  She does have a cyst located in the skin near the Achilles tendon and is curious if this is related to this.  She works in Research officer, political party    PMH/PSH/Family History/Social History/Meds/Allergies:    Past Medical History:  Diagnosis Date   Allergic rhinitis    Anxiety    Asthma    Colitis    Collagenous colitis 02/25/2015   02/2015 colonoscopy   Depression    Heart murmur    past hx of but not now    Hypothyroidism    IBS (irritable bowel syndrome)    Kidney stones    Meningitis    age 23 -   Scarlet fever    Shingles    Past Surgical History:  Procedure Laterality Date   COLONOSCOPY     LITHOTRIPSY     TONSILLECTOMY     WISDOM TOOTH EXTRACTION     Social History   Socioeconomic History   Marital status: Significant Other    Spouse name: Not on file   Number of children: 1   Years of education: Not on file   Highest education level: Associate degree: occupational, Scientist, product/process development, or vocational program  Occupational History   Occupation: real estate agent  Tobacco Use   Smoking status: Never   Smokeless tobacco: Never  Vaping Use   Vaping status: Never Used  Substance and Sexual Activity   Alcohol use: Yes     Alcohol/week: 0.0 standard drinks of alcohol    Comment: rare   Drug use: No   Sexual activity: Yes    Partners: Male    Birth control/protection: I.U.D.  Other Topics Concern   Not on file  Social History Narrative   Not on file   Social Drivers of Health   Financial Resource Strain: Low Risk  (12/31/2023)   Overall Financial Resource Strain (CARDIA)    Difficulty of Paying Living Expenses: Not hard at all  Food Insecurity: No Food Insecurity (12/31/2023)   Hunger Vital Sign    Worried About Running Out of Food in the Last Year: Never true    Ran Out of Food in the Last Year: Never true  Transportation Needs: No Transportation Needs (12/31/2023)   PRAPARE - Administrator, Civil Service (Medical): No    Lack of Transportation (Non-Medical): No  Physical Activity: Unknown (12/31/2023)   Exercise Vital Sign    Days of Exercise per Week: Patient declined    Minutes of Exercise per Session: Not on file  Stress: No Stress Concern Present (12/31/2023)   [employer] of Occupational Health - Occupational Stress Questionnaire    Feeling of Stress : Only  a little  Social Connections: Unknown (12/31/2023)   Social Connection and Isolation Panel    Frequency of Communication with Friends and Family: More than three times a week    Frequency of Social Gatherings with Friends and Family: Once a week    Attends Religious Services: Patient declined    Database administrator or Organizations: No    Attends Engineer, structural: Not on file    Marital Status: Divorced   Family History  Problem Relation Age of Onset   Hypertension Mother    Hypertension Father    Liver cancer Father    Alcohol abuse Father    Cirrhosis Father    Migraines Father    Uterine cancer Sister    Brain cancer Maternal Uncle    Breast cancer Paternal Aunt    Colon cancer Neg Hx    Esophageal cancer Neg Hx    Rectal cancer Neg Hx    Allergies  Allergen Reactions   Sulfur Anaphylaxis    Doxycycline Nausea And Vomiting   Elemental Sulfur Hives    Swell, rash, burns skin   Hydrocodone-Acetaminophen  Other (See Comments)   Morphine  And Codeine Nausea And Vomiting   Vicodin [Hydrocodone-Acetaminophen ] Nausea And Vomiting   Compazine  [Prochlorperazine ] Anxiety    Pt gets restless   Current Outpatient Medications  Medication Sig Dispense Refill   acetaminophen  (TYLENOL ) 500 MG tablet Take 1 tablet (500 mg total) by mouth every 8 (eight) hours for 10 days. 30 tablet 0   aspirin  EC 325 MG tablet Take 1 tablet (325 mg total) by mouth daily. 14 tablet 0   oxyCODONE  (ROXICODONE ) 5 MG immediate release tablet Take 1 tablet (5 mg total) by mouth every 4 (four) hours as needed for severe pain (pain score 7-10) or breakthrough pain. 5 tablet 0   Atogepant  (QULIPTA ) 60 MG TABS Take 1 tablet (60 mg total) by mouth daily. 30 tablet 11   Azelastine -Fluticasone  137-50 MCG/ACT SUSP SPRAY 1 SPRAY IN EACH NOSTRIL EVERY 12 HOURS 23 g 5   botulinum toxin Type A (BOTOX) 200 units injection Provider to inject 155 units into the muscles of the head and neck every 12 weeks. Discard remainder. 1 each 3   cetirizine (ZYRTEC) 10 MG tablet Take 10 mg by mouth daily.     clindamycin  (CLEOCIN -T) 1 % lotion Apply topically daily. 60 mL 5   lamoTRIgine  (LAMICTAL ) 25 MG tablet Take 1 tablet (25 mg total) by mouth every evening. 30 tablet 2   levonorgestrel  (MIRENA , 52 MG,) 20 MCG/24HR IUD 1 each by Intrauterine route once.     levothyroxine  (SYNTHROID ) 88 MCG tablet Take 1 tablet (88 mcg total) by mouth daily. 90 tablet 3   ondansetron  (ZOFRAN ) 4 MG tablet Take 1 tablet (4 mg total) by mouth every 6 (six) hours. 12 tablet 0   ondansetron  (ZOFRAN -ODT) 8 MG disintegrating tablet Take 1 tablet (8 mg total) by mouth every 8 (eight) hours as needed for nausea or vomiting. 18 tablet 0   potassium chloride  (KLOR-CON ) 10 MEQ tablet TAKE 1 TABLET BY MOUTH DAILY 30 tablet 0   tretinoin  (RETIN-A ) 0.025 % cream Apply  topically at bedtime. 45 g 4   UBRELVY 100 MG TABS Take by mouth.     venlafaxine  XR (EFFEXOR -XR) 150 MG 24 hr capsule Take 1 capsule (150 mg total) by mouth daily. 90 capsule 3   XIFAXAN  550 MG TABS tablet Take 550 mg by mouth 3 (three) times daily.  Zavegepant HCl (ZAVZPRET ) 10 MG/ACT SOLN Place 1 spray into the nose daily as needed. 8 each 0   No current facility-administered medications for this visit.   No results found.  Review of Systems:   A ROS was performed including pertinent positives and negatives as documented in the HPI.  Physical Exam :   Constitutional: NAD and appears stated age Neurological: Alert and oriented Psych: Appropriate affect and cooperative There were no vitals taken for this visit.   Comprehensive Musculoskeletal Exam:    Left ankle with tenderness about the peroneal tendons with subluxation with forced dorsi flexion and eversion.  Positive alar tilt   Right knee with tenderness about the patellofemoral joint range of motion is from -3 to 130 degrees.  No joint line tenderness negative McMurray, negative Lachman negative posterior drawer intact distal neurosensory exam  Imaging:   Xray (4 views right knee, 3 views left ankle): Normal  MRI right knee, left ankle: Right knee with medial plica and some femoral condyle hyperintensity but no significant chondral defect.  Left ankle shows chronically torn and thin ATFL tissue  I personally reviewed and interpreted the radiographs.   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  I did describe that overall in order to differentiate this from a patellofemoral source of pain I would like to perform a right knee ultrasound-guided injection.  She did get a very brief amount of complete her pain relief with regard to the injection.  Given this I did discuss the plica excision.  I did discuss the alternatives.  I did also discussed that patellofemoral pain syndrome could be  in the diagnosis.  However at this time her symptoms are more consistent with plica syndrome and given this I did discuss the possibility of arthroscopy with debridement.  She would like to proceed with this  -Plan for right knee arthroscopy with plica excision   After a lengthy discussion of treatment options, including risks, benefits, alternatives, complications of surgical and nonsurgical conservative options, the patient elected surgical repair.   The patient  is aware of the material risks  and complications including, but not limited to injury to adjacent structures, neurovascular injury, infection, numbness, bleeding, implant failure, thermal burns, stiffness, persistent pain, failure to heal, disease transmission from allograft, need for further surgery, dislocation, anesthetic risks, blood clots, risks of death,and others. The probabilities of surgical success and failure discussed with patient given their particular co-morbidities.The time and nature of expected rehabilitation and recovery was discussed.The patient's questions were all answered preoperatively.  No barriers to understanding were noted. I explained the natural history of the disease process and Rx rationale.  I explained to the patient what I considered to be reasonable expectations given their personal situation.  The final treatment plan was arrived at through a shared patient decision making process model.      I personally saw and evaluated the patient, and participated in the management and treatment plan.  Elspeth Parker, MD Attending Physician, Orthopedic Surgery  This document was dictated using Dragon voice recognition software. A reasonable attempt at proof reading has been made to minimize errors.

## 2024-07-16 ENCOUNTER — Ambulatory Visit (HOSPITAL_BASED_OUTPATIENT_CLINIC_OR_DEPARTMENT_OTHER): Admitting: Orthopaedic Surgery

## 2024-07-17 ENCOUNTER — Institutional Professional Consult (permissible substitution): Admitting: Neurology

## 2024-07-17 ENCOUNTER — Other Ambulatory Visit: Payer: Self-pay | Admitting: Family

## 2024-07-17 ENCOUNTER — Encounter: Payer: Self-pay | Admitting: Neurology

## 2024-07-17 DIAGNOSIS — J301 Allergic rhinitis due to pollen: Secondary | ICD-10-CM

## 2024-07-27 ENCOUNTER — Other Ambulatory Visit: Payer: Self-pay | Admitting: Family

## 2024-07-28 ENCOUNTER — Other Ambulatory Visit: Payer: Self-pay | Admitting: Family

## 2024-07-28 ENCOUNTER — Other Ambulatory Visit (HOSPITAL_BASED_OUTPATIENT_CLINIC_OR_DEPARTMENT_OTHER): Payer: Self-pay | Admitting: Family

## 2024-07-28 DIAGNOSIS — Z1231 Encounter for screening mammogram for malignant neoplasm of breast: Secondary | ICD-10-CM

## 2024-07-28 DIAGNOSIS — E876 Hypokalemia: Secondary | ICD-10-CM

## 2024-07-29 DIAGNOSIS — K7689 Other specified diseases of liver: Secondary | ICD-10-CM | POA: Diagnosis not present

## 2024-07-29 DIAGNOSIS — N200 Calculus of kidney: Secondary | ICD-10-CM | POA: Diagnosis not present

## 2024-07-29 DIAGNOSIS — R112 Nausea with vomiting, unspecified: Secondary | ICD-10-CM | POA: Diagnosis not present

## 2024-07-30 ENCOUNTER — Encounter: Payer: Self-pay | Admitting: Family

## 2024-07-31 ENCOUNTER — Other Ambulatory Visit: Payer: Self-pay | Admitting: Family

## 2024-07-31 DIAGNOSIS — R932 Abnormal findings on diagnostic imaging of liver and biliary tract: Secondary | ICD-10-CM

## 2024-08-01 ENCOUNTER — Inpatient Hospital Stay (HOSPITAL_BASED_OUTPATIENT_CLINIC_OR_DEPARTMENT_OTHER): Admission: RE | Admit: 2024-08-01 | Source: Ambulatory Visit | Admitting: Radiology

## 2024-08-01 DIAGNOSIS — Z1231 Encounter for screening mammogram for malignant neoplasm of breast: Secondary | ICD-10-CM

## 2024-08-04 DIAGNOSIS — K219 Gastro-esophageal reflux disease without esophagitis: Secondary | ICD-10-CM | POA: Diagnosis not present

## 2024-08-04 DIAGNOSIS — K580 Irritable bowel syndrome with diarrhea: Secondary | ICD-10-CM | POA: Diagnosis not present

## 2024-08-04 DIAGNOSIS — K52831 Collagenous colitis: Secondary | ICD-10-CM | POA: Diagnosis not present

## 2024-08-04 DIAGNOSIS — K449 Diaphragmatic hernia without obstruction or gangrene: Secondary | ICD-10-CM | POA: Diagnosis not present

## 2024-08-04 DIAGNOSIS — K2940 Chronic atrophic gastritis without bleeding: Secondary | ICD-10-CM | POA: Diagnosis not present

## 2024-08-05 ENCOUNTER — Encounter: Payer: Self-pay | Admitting: Neurology

## 2024-08-05 ENCOUNTER — Other Ambulatory Visit (INDEPENDENT_AMBULATORY_CARE_PROVIDER_SITE_OTHER)

## 2024-08-05 ENCOUNTER — Ambulatory Visit: Payer: Self-pay | Admitting: Family

## 2024-08-05 ENCOUNTER — Institutional Professional Consult (permissible substitution): Admitting: Neurology

## 2024-08-05 DIAGNOSIS — E876 Hypokalemia: Secondary | ICD-10-CM

## 2024-08-05 DIAGNOSIS — R932 Abnormal findings on diagnostic imaging of liver and biliary tract: Secondary | ICD-10-CM

## 2024-08-05 LAB — COMPREHENSIVE METABOLIC PANEL WITH GFR
ALT: 16 U/L (ref 0–35)
AST: 17 U/L (ref 0–37)
Albumin: 4.4 g/dL (ref 3.5–5.2)
Alkaline Phosphatase: 56 U/L (ref 39–117)
BUN: 9 mg/dL (ref 6–23)
CO2: 29 meq/L (ref 19–32)
Calcium: 9.1 mg/dL (ref 8.4–10.5)
Chloride: 101 meq/L (ref 96–112)
Creatinine, Ser: 0.7 mg/dL (ref 0.40–1.20)
GFR: 104.33 mL/min
Glucose, Bld: 92 mg/dL (ref 70–99)
Potassium: 3.3 meq/L — ABNORMAL LOW (ref 3.5–5.1)
Sodium: 138 meq/L (ref 135–145)
Total Bilirubin: 0.7 mg/dL (ref 0.2–1.2)
Total Protein: 6.9 g/dL (ref 6.0–8.3)

## 2024-08-08 ENCOUNTER — Encounter: Payer: Self-pay | Admitting: Neurology

## 2024-08-11 ENCOUNTER — Other Ambulatory Visit (HOSPITAL_COMMUNITY): Payer: Self-pay

## 2024-08-11 ENCOUNTER — Telehealth: Payer: Self-pay | Admitting: Pharmacist

## 2024-08-11 ENCOUNTER — Other Ambulatory Visit: Payer: Self-pay | Admitting: Neurology

## 2024-08-11 MED ORDER — UBRELVY 100 MG PO TABS: 100.0000 mg | ORAL_TABLET | ORAL | 0 refills | Status: DC | PRN

## 2024-08-11 NOTE — Telephone Encounter (Signed)
 Pharmacy Patient Advocate Encounter   Received notification from Patient Pharmacy that prior authorization for Ubrelvy 100MG  tablets is required/requested.   Insurance verification completed.   The patient is insured through [facility] .   Per test claim: PA required; PA submitted to above mentioned insurance via Latent Key/confirmation #/EOC [id number] Status is pending

## 2024-08-11 NOTE — Telephone Encounter (Signed)
 Pharmacy Patient Advocate Encounter  Received notification from [facility] that Prior Authorization for UBRELVY 100 MG PO TABS has been APPROVED from 08/11/2024 to 08/11/2025   PA #/Case ID/Reference #: [id number]

## 2024-08-11 NOTE — Telephone Encounter (Signed)
 From office note:

## 2024-08-18 ENCOUNTER — Other Ambulatory Visit: Payer: Self-pay

## 2024-08-18 ENCOUNTER — Emergency Department (HOSPITAL_BASED_OUTPATIENT_CLINIC_OR_DEPARTMENT_OTHER)
Admission: EM | Admit: 2024-08-18 | Discharge: 2024-08-18 | Disposition: A | Discharge status: Home / Self Care | Attending: Emergency Medicine | Admitting: Emergency Medicine

## 2024-08-18 ENCOUNTER — Emergency Department (HOSPITAL_BASED_OUTPATIENT_CLINIC_OR_DEPARTMENT_OTHER): Admitting: Radiology

## 2024-08-18 ENCOUNTER — Ambulatory Visit: Admitting: Physician Assistant

## 2024-08-18 ENCOUNTER — Encounter (HOSPITAL_BASED_OUTPATIENT_CLINIC_OR_DEPARTMENT_OTHER): Payer: Self-pay | Admitting: Emergency Medicine

## 2024-08-18 ENCOUNTER — Ambulatory Visit: Admitting: Neurology

## 2024-08-18 DIAGNOSIS — J45909 Unspecified asthma, uncomplicated: Secondary | ICD-10-CM | POA: Diagnosis not present

## 2024-08-18 DIAGNOSIS — R0789 Other chest pain: Secondary | ICD-10-CM | POA: Diagnosis not present

## 2024-08-18 DIAGNOSIS — R079 Chest pain, unspecified: Secondary | ICD-10-CM | POA: Diagnosis not present

## 2024-08-18 DIAGNOSIS — R110 Nausea: Secondary | ICD-10-CM | POA: Diagnosis not present

## 2024-08-18 LAB — HEPATIC FUNCTION PANEL
ALT: 12 U/L (ref 0–44)
AST: 15 U/L (ref 15–41)
Albumin: 4.5 g/dL (ref 3.5–5.0)
Alkaline Phosphatase: 67 U/L (ref 38–126)
Bilirubin, Direct: 0.2 mg/dL (ref 0.0–0.2)
Indirect Bilirubin: 0.3 mg/dL (ref 0.3–0.9)
Total Bilirubin: 0.4 mg/dL (ref 0.0–1.2)
Total Protein: 7 g/dL (ref 6.5–8.1)

## 2024-08-18 LAB — CBC
HCT: 35.4 % — ABNORMAL LOW (ref 36.0–46.0)
Hemoglobin: 12.2 g/dL (ref 12.0–15.0)
MCH: 30.3 pg (ref 26.0–34.0)
MCHC: 34.5 g/dL (ref 30.0–36.0)
MCV: 87.8 fL (ref 80.0–100.0)
Platelets: 276 K/uL (ref 150–400)
RBC: 4.03 MIL/uL (ref 3.87–5.11)
RDW: 12.3 % (ref 11.5–15.5)
WBC: 7.9 K/uL (ref 4.0–10.5)
nRBC: 0 % (ref 0.0–0.2)

## 2024-08-18 LAB — BASIC METABOLIC PANEL WITH GFR
Anion gap: 13 (ref 5–15)
BUN: 16 mg/dL (ref 6–20)
CO2: 24 mmol/L (ref 22–32)
Calcium: 9.2 mg/dL (ref 8.9–10.3)
Chloride: 103 mmol/L (ref 98–111)
Creatinine, Ser: 0.71 mg/dL (ref 0.44–1.00)
GFR, Estimated: >60 mL/min (ref >60)
Glucose, Bld: 91 mg/dL (ref 70–99)
Potassium: 3.2 mmol/L — ABNORMAL LOW (ref 3.5–5.1)
Sodium: 140 mmol/L (ref 135–145)

## 2024-08-18 LAB — PREGNANCY, URINE: Preg Test, Ur: NEGATIVE

## 2024-08-18 LAB — TROPONIN T, HIGH SENSITIVITY
Troponin T High Sensitivity: <15 ng/L (ref 0–19)
Troponin T High Sensitivity: <15 ng/L (ref 0–19)

## 2024-08-18 LAB — LIPASE, BLOOD: Lipase: 31 U/L (ref 11–51)

## 2024-08-18 MED ORDER — LIDOCAINE VISCOUS HCL 2 % MT SOLN
15.0000 mL | Freq: Once | OROMUCOSAL | Status: AC
  Administered 2024-08-18: 15 mL via ORAL
  Filled 2024-08-18: qty 15

## 2024-08-18 MED ORDER — ALUM & MAG HYDROXIDE-SIMETH 200-200-20 MG/5ML PO SUSP
30.0000 mL | Freq: Once | ORAL | Status: AC
  Administered 2024-08-18: 30 mL via ORAL
  Filled 2024-08-18: qty 30

## 2024-08-18 MED ORDER — POTASSIUM CHLORIDE CRYS ER 20 MEQ PO TBCR
40.0000 meq | EXTENDED_RELEASE_TABLET | Freq: Once | ORAL | Status: AC
  Administered 2024-08-18: 40 meq via ORAL
  Filled 2024-08-18: qty 2

## 2024-08-18 NOTE — Discharge Instructions (Signed)
 Your workup today showed no evidence of heart attack. There do not appear to be any other cause of your chest pain that is emergent life limb or organ threatening.  I suspect this is likely related to GI symptoms and you should be taking the acid reducing medication given to you by your GI specialist.  Calcium again was low at 3.2 and I gave you an extra dose of potassium today.  He should get this rechecked as soon as possible with your primary care physician.  The x-ray did show some dilated loops of bowel on a plain film.  Sometimes this can be associated with a minor slowing down of your gut or bowel obstruction.  Based on your clinical examination I have very low suspicion for this as generally would have a lot of nausea and vomiting along with abdominal distention and tenderness on your abdominal exam which did not seem to be present.  If you have worsening distention bloating abdominal pain and vomiting you should return to the emergency department immediately.  Otherwise you are safe to follow closely with your primary care physician for further management.  Your caregiver has diagnosed you as having chest pain that is not specific for one problem, but does not require admission.  You are at low risk for an acute heart condition or other serious illness. Chest pain comes from many different causes.  SEEK IMMEDIATE MEDICAL ATTENTION IF: You have severe chest pain, especially if the pain is crushing or pressure-like and spreads to the arms, back, neck, or jaw, or if you have sweating, nausea (feeling sick to your stomach), or shortness of breath. THIS IS AN EMERGENCY. Don't wait to see if the pain will go away. Get medical help at once. Call 911 or 0 (operator). DO NOT drive yourself to the hospital.  Your chest pain gets worse and does not go away with rest.  You have an attack of chest pain lasting longer than usual, despite rest and treatment with the medications your caregiver has  prescribed.  You wake from sleep with chest pain or shortness of breath.  You feel dizzy or faint.  You have chest pain not typical of your usual pain for which you originally saw your caregiver.

## 2024-08-18 NOTE — ED Notes (Signed)
Discharge instructions and follow up care reviewed and explained, pt verbalized understanding and had no further questions on d/c.

## 2024-08-18 NOTE — ED Provider Notes (Signed)
 [place] EMERGENCY DEPARTMENT AT [facility] Provider Note   CSN: [id number] Arrival date & time: 08/18/24  1524     Patient presents with: Chest Pain   Alison Clarke is a 43 y.o. female who  has a past medical history of Allergic rhinitis, Anxiety, Asthma, Colitis, Collagenous colitis (02/25/2015), Depression, Heart murmur, Hypothyroidism, IBS (irritable bowel syndrome), Kidney stones, Meningitis, Scarlet fever, and Shingles. She presents with left sided chest pain. She has been having intermittent left sided chest pain shich is pressure like. Lasts approx 30 min. Aching radiates to left hand  + paresthesia and radiates to back. Had associated nausea. Has been battling nausea and diarrhea due to collagenous collits and hiatal hernia. Has had gi K loss and increased her own dose over past few days bcause she felt bad. Today while on her way to an appt she had onset of her worst bout of sxs starting at 2:30 PM. Sxs are still present, constant but wax and waning in severity. No aggravating or alleviating sxs. Has had some bleching and nausea. No hx of DM, smoking, HTN, HLD or FMHx of CAD.    Chest Pain      Prior to Admission medications   Medication Sig Start Date End Date Taking? Authorizing Provider  aspirin  EC 325 MG tablet Take 1 tablet (325 mg total) by mouth daily. 07/09/24   Genelle Standing, MD  Atogepant  (QULIPTA ) 60 MG TABS Take 1 tablet (60 mg total) by mouth daily. 06/02/24   Ines Onetha NOVAK, MD  Azelastine -Fluticasone  137-50 MCG/ACT SUSP SPRAY 1 SPRAY IN EACH NOSTRIL TWICE DAILY 07/17/24   Dugal, Tabitha, FNP  botulinum toxin Type A (BOTOX) 200 units injection Provider to inject 155 units into the muscles of the head and neck every 12 weeks. Discard remainder. 06/12/24   Ines Onetha NOVAK, MD  cetirizine (ZYRTEC) 10 MG tablet Take 10 mg by mouth daily.    [provider]  clindamycin  (CLEOCIN -T) 1 % lotion Apply topically daily. 05/22/24 05/22/25  Paci, Karina M,  MD  lamoTRIgine  (LAMICTAL ) 25 MG tablet Take 1 tablet (25 mg total) by mouth every evening. 06/30/24   Dugal, Tabitha, FNP  levonorgestrel  (MIRENA , 52 MG,) 20 MCG/24HR IUD 1 each by Intrauterine route once.    [provider]  levothyroxine  (SYNTHROID ) 88 MCG tablet Take 1 tablet (88 mcg total) by mouth daily. 02/11/24   Dugal, Tabitha, FNP  ondansetron  (ZOFRAN ) 4 MG tablet Take 1 tablet (4 mg total) by mouth every 6 (six) hours. 04/24/24   Robinson, John K, PA-C  ondansetron  (ZOFRAN -ODT) 8 MG disintegrating tablet Take 1 tablet (8 mg total) by mouth every 8 (eight) hours as needed for nausea or vomiting. 05/28/24   Patsey Lot, MD  oxyCODONE  (ROXICODONE ) 5 MG immediate release tablet Take 1 tablet (5 mg total) by mouth every 4 (four) hours as needed for severe pain (pain score 7-10) or breakthrough pain. 07/09/24   Genelle Standing, MD  potassium chloride  (KLOR-CON ) 10 MEQ tablet TAKE 1 TABLET BY MOUTH DAILY 06/30/24   Corwin Antu, FNP  tretinoin  (RETIN-A ) 0.025 % cream Apply topically at bedtime. 05/22/24 05/22/25  Paci, Karina M, MD  UBRELVY 100 MG TABS Take 1 tablet (100 mg total) by mouth as needed. 08/11/24   Camara, Amadou, MD  venlafaxine  XR (EFFEXOR -XR) 150 MG 24 hr capsule Take 1 capsule (150 mg total) by mouth daily. 01/08/24   Corwin Antu, FNP  XIFAXAN  550 MG TABS tablet Take 550 mg by mouth 3 (three)  times daily. 04/18/24   [provider]  Zavegepant HCl (ZAVZPRET ) 10 MG/ACT SOLN Place 1 spray into the nose daily as needed. 06/02/24   Ines Onetha NOVAK, MD    Allergies: Sulfur, Doxycycline, Elemental sulfur, Hydrocodone-acetaminophen , Morphine  and codeine, Vicodin [hydrocodone-acetaminophen ], and Compazine  [prochlorperazine ]    Review of Systems  Cardiovascular:  Positive for chest pain.    Updated Vital Signs BP 126/87   Pulse 73   Temp 98.7 F (37.1 C) (Oral)   Resp 15   Ht 5' 1 (1.549 m)   Wt 77.1 kg   SpO2 100%   BMI 32.12 kg/m   Physical  Exam Vitals and nursing note reviewed.  Constitutional:      General: She is not in acute distress.    Appearance: She is well-developed. She is not diaphoretic.  HENT:     Head: Normocephalic and atraumatic.     Right Ear: External ear normal.     Left Ear: External ear normal.     Nose: Nose normal.     Mouth/Throat:     Mouth: Mucous membranes are moist.  Eyes:     General: No scleral icterus.    Conjunctiva/sclera: Conjunctivae normal.  Cardiovascular:     Rate and Rhythm: Normal rate and regular rhythm.     Heart sounds: Normal heart sounds. No murmur heard.    No friction rub. No gallop.  Pulmonary:     Effort: Pulmonary effort is normal. No respiratory distress.     Breath sounds: Normal breath sounds.  Abdominal:     General: Bowel sounds are normal. There is no distension.     Palpations: Abdomen is soft. There is no mass.     Tenderness: There is no abdominal tenderness. There is no guarding.  Musculoskeletal:     Cervical back: Normal range of motion.  Skin:    General: Skin is warm and dry.  Neurological:     Mental Status: She is alert and oriented to person, place, and time.  Psychiatric:        Behavior: Behavior normal.     (all labs ordered are listed, but only abnormal results are displayed) Labs Reviewed  BASIC METABOLIC PANEL WITH GFR - Abnormal; Notable for the following components:      Result Value   Potassium 3.2 (*)    All other components within normal limits  CBC - Abnormal; Notable for the following components:   HCT 35.4 (*)    All other components within normal limits  PREGNANCY, URINE  HEPATIC FUNCTION PANEL  LIPASE, BLOOD  TROPONIN T, HIGH SENSITIVITY  TROPONIN T, HIGH SENSITIVITY    EKG: EKG Interpretation Date/Time:  Monday August 18 2024 15:40:22 EDT Ventricular Rate:  75 PR Interval:  127 QRS Duration:  99 QT Interval:  383 QTC Calculation: 428 R Axis:   25  Text Interpretation: Sinus rhythm Ventricular premature  complex Low voltage, precordial leads Abnormal R-wave progression, early transition since last tracing no significant change Confirmed by Lenor Hollering [id number]) on 08/18/2024 4:28:10 PM  Radiology: [facility] Chest 2 View Result Date: 08/18/2024 EXAM: 2 VIEW(S) XRAY OF THE CHEST 08/18/2024 05:05:00 PM COMPARISON: None available. CLINICAL HISTORY: CP. Pt caox4 ambulatory c/o L side CP intermittent for the past 2 days with nausea x1 wk. Pt states she took 8mg  zofran  approx 15 min PTA which helped with the nausea. FINDINGS: LUNGS AND PLEURA: No focal pulmonary opacity. No pulmonary edema. No pleural effusion. No pneumothorax. HEART AND MEDIASTINUM: No acute  abnormality of the cardiac and mediastinal silhouettes. Air fluid levels are identified with an upper abdominal small bowel loops. BONES AND SOFT TISSUES: No acute osseous abnormality. IMPRESSION: 1. Small bowel air fluid levels in the upper abdomen could represent ileus or low-grade obstruction in this patient with a history of nausea. 2. No acute findings in the chest. Electronically signed by: Rockey Kilts MD 08/18/2024 05:40 PM EDT RP Workstation: [id number]     Procedures   Medications Ordered in the ED  potassium chloride  SA (KLOR-CON  M) CR tablet 40 mEq (has no administration in time range)  alum & mag hydroxide-simeth (MAALOX/MYLANTA) 200-200-20 MG/5ML suspension 30 mL (30 mLs Oral Given 08/18/24 1633)    And  lidocaine  (XYLOCAINE ) 2 % viscous mouth solution 15 mL (15 mLs Oral Given 08/18/24 1633)    Clinical Course as of 08/18/24 1841  Mon Aug 18, 2024  1838 Basic metabolic panel(!) [AH]  1838 CBC(!) [AH]  [id number] Hepatic function panel [AH]  1838 Lipase, blood [AH]  1838 Potassium(!): 3.2 Mild hypokalemia repleted orally [AH]  1838 Troponin T High Sensitivity: <15 Troponin is negative x 2 [AH]  1839 DG Chest 2 View Chest x-ray shows no acute findings in the chest.  There are some dilated small bowel loops which could represent an ileus in this  patient with a history of GI symptoms.  She has no fullness distention vomiting or abdominal tenderness at this time.  Low suspicion for clinically for obstruction or ileus. [AH]  1839 I visualized interpreted patient's EKG which shows sinus rhythm at a rate of 76 without evidence of EMEA. [AH]    Clinical Course User Index [AH] Arloa Chroman, PA-C             HEART Score: 1                Geneva (Revised) Score: 0, Geneva Score Interpretation: Low Risk Group: 7-9% incidence of pulmonary embolism from several studies PERC Score: 0, PERC Score Interpretation: No need for further workup, as <2% chance of PE.  If no criteria are positive and clinicians pre-test probability is <15%, PERC Rule criteria are satisfied Medical Decision Making Amount and/or Complexity of Data Reviewed Labs: ordered. Decision-making details documented in ED Course. Radiology: ordered. Decision-making details documented in ED Course.  Risk OTC drugs. Prescription drug management.   Given the large differential diagnosis for MINERVIA OSSO, the decision making in this case is of high complexity.  After evaluating all of the data points in this case, the presentation of NIANA MARTORANA is NOT consistent with Acute Coronary Syndrome (ACS) and/or myocardial ischemia, pulmonary embolism, aortic dissection; Borhaave's, significant arrythmia, pneumothorax, cardiac tamponade, or other emergent cardiopulmonary condition.  Further, the presentation of CHYENNE SOBCZAK is NOT consistent with pericarditis, myocarditis, cholecystitis, pancreatitis, mediastinitis, endocarditis, new valvular disease.  Additionally, the presentation of BRADEE COMMON NOT consistent with flail chest, cardiac contusion, ARDS, or significant intra-thoracic or intra-abdominal bleeding.  Moreover, this presentation is NOT consistent with pneumonia, sepsis, or pyelonephritis.  The patient has a HEART Score: 1  Lw risk for PE based off PERC and  Geneva PE scores.    Strict return and follow-up precautions have been given by me personally or by detailed written instruction given verbally by nursing staff using the teach back method to the patient/family/caregiver(s).  Data Reviewed/Counseling: I have reviewed the patient's vital signs, nursing notes, and other relevant tests/information. I had a detailed discussion regarding the historical points, exam findings, and  any diagnostic results supporting the discharge diagnosis. I also discussed the need for outpatient follow-up and the need to return to the ED if symptoms worsen or if there are any questions or concerns that arise at home.       Final diagnoses:  None    ED Discharge Orders     None          Arloa Chroman, PA-C 08/18/24 1842    Lenor Hollering, MD 09/05/24 1530

## 2024-08-18 NOTE — ED Triage Notes (Signed)
 Pt caox4 ambulatory c/o L side CP intermittent for the past 2 days with nausea x1 wk. Pt states she took 8mg  zofran  approx 15 min PTA which helped with the nausea.

## 2024-08-19 ENCOUNTER — Ambulatory Visit: Admitting: Neurology

## 2024-08-19 VITALS — BP 120/86

## 2024-08-19 DIAGNOSIS — G43709 Chronic migraine without aura, not intractable, without status migrainosus: Secondary | ICD-10-CM | POA: Diagnosis not present

## 2024-08-19 MED ORDER — ONABOTULINUMTOXINA 200 UNITS IJ SOLR
155.0000 [IU] | Freq: Once | INTRAMUSCULAR | Status: AC
  Administered 2024-08-19: 155 [IU] via INTRAMUSCULAR

## 2024-08-19 NOTE — Progress Notes (Signed)
   BOTOX PROCEDURE NOTE FOR MIGRAINE HEADACHE  HISTORY: Alison Clarke is here for Botox. This is her 1st Botox. Dr. Ines started her on Qulipta  in July. Migraines are some better. Prior to see Dr. Ines, was having daily headache, of those 1 migraine a week. Qulipta  has helped, only 1-2 migraines a month, 2 times a week headache. MRI brain was ordered, but didn't have it done. [name] as needed with good benefit. Missed sleep consult, she was sick, needs to reschedule. Having GI issues, colitis is chronic, Qulipta  worsened symptoms? Not sure if insurance will continue to pay for Qulipta ? Potassium has been low.   Description of procedure:  The patient was placed in a sitting position. The standard protocol was used for Botox as follows, with 5 units of Botox injected at each site:   -Procerus muscle, midline injection  -Corrugator muscle, bilateral injection  -Frontalis muscle, bilateral injection, with 2 sites each side, medial injection was performed in the upper one third of the frontalis muscle, in the region vertical from the medial inferior edge of the superior orbital rim. The lateral injection was again in the upper one third of the forehead vertically above the lateral limbus of the cornea, 1.5 cm lateral to the medial injection site.  -Temporalis muscle injection, 4 sites, bilaterally. The first injection was 3 cm above the tragus of the ear, second injection site was 1.5 cm to 3 cm up from the first injection site in line with the tragus of the ear. The third injection site was 1.5-3 cm forward between the first 2 injection sites. The fourth injection site was 1.5 cm posterior to the second injection site.  -Occipitalis muscle injection, 3 sites, bilaterally. The first injection was done one half way between the occipital protuberance and the tip of the mastoid process behind the ear. The second injection site was done lateral and superior to the first, 1 fingerbreadth from the  first injection. The third injection site was 1 fingerbreadth superiorly and medially from the first injection site.  -Cervical paraspinal muscle injection, 2 sites, bilateral, the first injection site was 1 cm from the midline of the cervical spine, 3 cm inferior to the lower border of the occipital protuberance. The second injection site was 1.5 cm superiorly and laterally to the first injection site.  -Trapezius muscle injection was performed at 3 sites, bilaterally. The first injection site was in the upper trapezius muscle halfway between the inflection point of the neck, and the acromion. The second injection site was one half way between the acromion and the first injection site. The third injection was done between the first injection site and the inflection point of the neck.   A 200 unit bottle of Botox was used, 155 units were injected, the rest of the Botox was wasted. The patient tolerated the procedure well, there were no complications of the above procedure.  Botox NDC 9976-6078-97 Lot number [id number] Expiration date 04/2025 SP  Needs to reschedule sleep consult.

## 2024-08-19 NOTE — Progress Notes (Signed)
 Botox- 200 units x 1 vial Lot: [id number] Expiration: 2026/06 NDC: 0023-3921-02  Bacteriostatic 0.9% Sodium Chloride - 4 mL  Lot: [id number] Expiration: 09/12/25 NDC: 9590803397  Dx: H56.290  S/P  Witnessed by DELENA MOLT

## 2024-08-28 DIAGNOSIS — R69 Illness, unspecified: Secondary | ICD-10-CM | POA: Diagnosis not present

## 2024-09-11 ENCOUNTER — Ambulatory Visit
Admission: RE | Admit: 2024-09-11 | Discharge: 2024-09-11 | Disposition: A | Discharge status: Home / Self Care | Source: Ambulatory Visit | Attending: Emergency Medicine | Admitting: Emergency Medicine

## 2024-09-11 VITALS — BP 113/78 | HR 94 | Temp 100.7°F | Resp 20

## 2024-09-11 DIAGNOSIS — U071 COVID-19: Secondary | ICD-10-CM | POA: Diagnosis not present

## 2024-09-11 DIAGNOSIS — B349 Viral infection, unspecified: Secondary | ICD-10-CM

## 2024-09-11 LAB — POC COVID19/FLU A&B COMBO
Covid Antigen, POC: POSITIVE — AB
Influenza A Antigen, POC: NEGATIVE
Influenza B Antigen, POC: NEGATIVE

## 2024-09-11 MED ORDER — ALBUTEROL SULFATE HFA 108 (90 BASE) MCG/ACT IN AERS: 2.0000 | INHALATION_SPRAY | RESPIRATORY_TRACT | 0 refills | Status: AC | PRN

## 2024-09-11 MED ORDER — PAXLOVID (300/100) 20 X 150 MG & 10 X 100MG PO TBPK: 3.0000 | ORAL_TABLET | Freq: Two times a day (BID) | ORAL | 0 refills | Status: AC

## 2024-09-11 MED ORDER — PREDNISONE 10 MG (21) PO TBPK: ORAL_TABLET | Freq: Every day | ORAL | 0 refills | Status: DC

## 2024-09-11 MED ORDER — ACETAMINOPHEN 325 MG PO TABS
650.0000 mg | ORAL_TABLET | Freq: Once | ORAL | Status: AC
  Administered 2024-09-11: 650 mg via ORAL

## 2024-09-11 MED ORDER — PROMETHAZINE-DM 6.25-15 MG/5ML PO SYRP: 5.0000 mL | ORAL_SOLUTION | Freq: Every evening | ORAL | 0 refills | Status: DC | PRN

## 2024-09-11 NOTE — ED Provider Notes (Signed)
 Alison Clarke    CSN: [id number] Arrival date & time: 09/11/24  1300      History   Chief Complaint Chief Complaint  Patient presents with   Cough    Chest congestion - Entered by patient    HPI Alison Clarke is a 43 y.o. female.   Patient presents for evaluation of intermittent headaches, intermittent hoarseness, nasal congestion, chest congestion, nonproductive cough, sore throat and centralized chest pressure beginning 1 day ago.  Known sick contact in household.  Has attempted use of Alka-Seltzer.  Denies shortness of breath wheezing.  Decreased appetite but tolerating food and liquids.  Past Medical History:  Diagnosis Date   Allergic rhinitis    Anxiety    Asthma    Colitis    Collagenous colitis 02/25/2015   02/2015 colonoscopy   Depression    Heart murmur    past hx of but not now    Hypothyroidism    IBS (irritable bowel syndrome)    Kidney stones    Meningitis    age 45 -   Scarlet fever    Shingles     Patient Active Problem List   Diagnosis Date Noted   Burping 03/05/2024   Generalized abdominal pain 03/05/2024   Diarrhea of presumed infectious origin 03/05/2024   Bilious vomiting with nausea 03/05/2024   Acquired hypothyroidism 04/03/2023   Concentration deficit 04/03/2023   Hypokalemia 04/03/2023   Vitamin D  deficiency 04/03/2023   Hyperglycemia 04/03/2023   Low serum vitamin B12 04/03/2023   Seasonal allergic rhinitis due to pollen 04/03/2023   Vitiligo 04/03/2023   GAD (generalized anxiety disorder) 03/06/2023   Chronic migraine without aura without status migrainosus, not intractable 08/10/2022   Bilateral nephrolithiasis 10/23/2019   IUD (intrauterine device) in place 10/23/2019   Collagenous colitis 02/25/2015    Past Surgical History:  Procedure Laterality Date   COLONOSCOPY     LITHOTRIPSY     TONSILLECTOMY     WISDOM TOOTH EXTRACTION      OB History     Gravida  3   Para  1   Term  1   Preterm      AB  2    Living  1      SAB  1   IAB  1   Ectopic      Multiple      Live Births               Home Medications    Prior to Admission medications   Medication Sig Start Date End Date Taking? Authorizing Provider  albuterol (VENTOLIN HFA) 108 (90 Base) MCG/ACT inhaler Inhale 2 puffs into the lungs every 4 (four) hours as needed for wheezing or shortness of breath. 09/11/24  Yes Jannely Henthorn R, NP  nirmatrelvir/ritonavir (PAXLOVID, 300/100,) 20 x 150 MG & 10 x 100MG  TBPK Take 3 tablets by mouth 2 (two) times daily for 5 days. Patient GFR is 60. Take nirmatrelvir (150 mg) two tablets twice daily for 5 days and ritonavir (100 mg) one tablet twice daily for 5 days. 09/11/24 09/16/24 Yes Lafawn Lenoir R, NP  predniSONE  (STERAPRED UNI-PAK 21 TAB) 10 MG (21) TBPK tablet Take by mouth daily. Take 6 tabs by mouth daily  for 1 days, then 5 tabs for 1 days, then 4 tabs for 1 days, then 3 tabs for 1 days, 2 tabs for 1 days, then 1 tab by mouth daily for 1 days 09/11/24  Yes Desaree Downen, [place],  NP  promethazine -dextromethorphan (PROMETHAZINE -DM) 6.25-15 MG/5ML syrup Take 5 mLs by mouth at bedtime as needed. 09/11/24  Yes Eluterio Seymour R, NP  aspirin  EC 325 MG tablet Take 1 tablet (325 mg total) by mouth daily. 07/09/24   Genelle Standing, MD  Atogepant  (QULIPTA ) 60 MG TABS Take 1 tablet (60 mg total) by mouth daily. 06/02/24   Ines Onetha NOVAK, MD  Azelastine -Fluticasone  137-50 MCG/ACT SUSP SPRAY 1 SPRAY IN EACH NOSTRIL TWICE DAILY 07/17/24   Dugal, Tabitha, FNP  botulinum toxin Type A (BOTOX) 200 units injection Provider to inject 155 units into the muscles of the head and neck every 12 weeks. Discard remainder. 06/12/24   Ines Onetha NOVAK, MD  cetirizine (ZYRTEC) 10 MG tablet Take 10 mg by mouth daily.    [provider]  clindamycin  (CLEOCIN -T) 1 % lotion Apply topically daily. 05/22/24 05/22/25  Paci, Karina M, MD  lamoTRIgine  (LAMICTAL ) 25 MG tablet Take 1 tablet (25 mg total) by mouth every  evening. 06/30/24   Dugal, Tabitha, FNP  levonorgestrel  (MIRENA , 52 MG,) 20 MCG/24HR IUD 1 each by Intrauterine route once.    [provider]  levothyroxine  (SYNTHROID ) 88 MCG tablet Take 1 tablet (88 mcg total) by mouth daily. 02/11/24   Dugal, Tabitha, FNP  ondansetron  (ZOFRAN ) 4 MG tablet Take 1 tablet (4 mg total) by mouth every 6 (six) hours. 04/24/24   Robinson, John K, PA-C  ondansetron  (ZOFRAN -ODT) 8 MG disintegrating tablet Take 1 tablet (8 mg total) by mouth every 8 (eight) hours as needed for nausea or vomiting. 05/28/24   Patsey Lot, MD  oxyCODONE  (ROXICODONE ) 5 MG immediate release tablet Take 1 tablet (5 mg total) by mouth every 4 (four) hours as needed for severe pain (pain score 7-10) or breakthrough pain. 07/09/24   Genelle Standing, MD  potassium chloride  (KLOR-CON ) 10 MEQ tablet TAKE 1 TABLET BY MOUTH DAILY 06/30/24   Corwin Antu, FNP  tretinoin  (RETIN-A ) 0.025 % cream Apply topically at bedtime. 05/22/24 05/22/25  Paci, Karina M, MD  UBRELVY 100 MG TABS Take 1 tablet (100 mg total) by mouth as needed. 08/11/24   Camara, Amadou, MD  venlafaxine  XR (EFFEXOR -XR) 150 MG 24 hr capsule Take 1 capsule (150 mg total) by mouth daily. 01/08/24   Corwin Antu, FNP  XIFAXAN  550 MG TABS tablet Take 550 mg by mouth 3 (three) times daily. 04/18/24   [provider]  Zavegepant HCl (ZAVZPRET ) 10 MG/ACT SOLN Place 1 spray into the nose daily as needed. 06/02/24   Ines Onetha NOVAK, MD    Family History Family History  Problem Relation Age of Onset   Hypertension Mother    Hypertension Father    Liver cancer Father    Alcohol abuse Father    Cirrhosis Father    Migraines Father    Uterine cancer Sister    Brain cancer Maternal Uncle    Breast cancer Paternal Aunt    Colon cancer Neg Hx    Esophageal cancer Neg Hx    Rectal cancer Neg Hx     Social History Social History   Tobacco Use   Smoking status: Never   Smokeless tobacco: Never  Vaping Use   Vaping  status: Never Used  Substance Use Topics   Alcohol use: Yes    Alcohol/week: 0.0 standard drinks of alcohol    Comment: rare   Drug use: No     Allergies   Sulfur, Doxycycline, Elemental sulfur, Hydrocodone-acetaminophen , Morphine  and codeine, Vicodin [hydrocodone-acetaminophen ], and Compazine  [prochlorperazine ]  Review of Systems Review of Systems   Physical Exam Triage Vital Signs ED Triage Vitals  Encounter Vitals Group     BP 09/11/24 1347 113/78     Girls Systolic BP Percentile --      Girls Diastolic BP Percentile --      Boys Systolic BP Percentile --      Boys Diastolic BP Percentile --      Pulse Rate 09/11/24 1347 94     Resp 09/11/24 1347 20     Temp 09/11/24 1347 (!) 100.7 F (38.2 C)     Temp Source 09/11/24 1347 Oral     SpO2 09/11/24 1347 96 %     Weight --      Height --      Head Circumference --      Peak Flow --      Pain Score 09/11/24 1346 0     Pain Loc --      Pain Education --      Exclude from Growth Chart --    No data found.  Updated Vital Signs BP 113/78 (BP Location: Left Arm)   Pulse 94   Temp (!) 100.7 F (38.2 C) (Oral)   Resp 20   SpO2 96%   Visual Acuity Right Eye Distance:   Left Eye Distance:   Bilateral Distance:    Right Eye Near:   Left Eye Near:    Bilateral Near:     Physical Exam Constitutional:      Appearance: Normal appearance.  HENT:     Right Ear: Tympanic membrane, ear canal and external ear normal.     Left Ear: Tympanic membrane, ear canal and external ear normal.     Nose: Congestion present.     Mouth/Throat:     Mouth: Mucous membranes are moist.     Pharynx: Oropharynx is clear.  Eyes:     Extraocular Movements: Extraocular movements intact.  Cardiovascular:     Rate and Rhythm: Normal rate and regular rhythm.     Pulses: Normal pulses.     Heart sounds: Normal heart sounds.  Pulmonary:     Effort: Pulmonary effort is normal.     Breath sounds: Normal breath sounds.  Neurological:      Mental Status: She is alert and oriented to person, place, and time. Mental status is at baseline.      UC Treatments / Results  Labs (all labs ordered are listed, but only abnormal results are displayed) Labs Reviewed  POC COVID19/FLU A&B COMBO - Abnormal; Notable for the following components:      Result Value   Covid Antigen, POC Positive (*)    All other components within normal limits    EKG   Radiology No results found.  Procedures Procedures (including critical care time)  Medications Ordered in UC Medications  acetaminophen  (TYLENOL ) tablet 650 mg (650 mg Oral Given 09/11/24 1349)    Initial Impression / Assessment and Plan / UC Course  I have reviewed the triage vital signs and the nursing notes.  Pertinent labs & imaging results that were available during my care of the patient were reviewed by me and considered in my medical decision making (see chart for details).  COVID-19, viral illness  Patient is in no signs of distress nor toxic appearing.  Vital signs are stable.  Low suspicion for pneumonia, pneumothorax or bronchitis and therefore will defer imaging.  Discussed quarantine per the CDC.  Prescribed Paxlovid, prednisone , albuterol inhaler  and Promethazine  DM cough syrup, declined Tessalon.May use additional over-the-counter medications as needed for supportive care.  May follow-up with urgent care as needed if symptoms persist or worsen.  Final Clinical Impressions(s) / UC Diagnoses   Final diagnoses:  Viral illness  COVID-19     Discharge Instructions      Covid 19 is a virus and should steadily improve in time it can take up to 7 to 10 days before you truly start to see a turnaround however things will get better    Per the CDC you will need to quarantine and to your 24 hours without fever, if no fever may continue activity wearing mask  Begin antiviral taking twice daily for 5 days to reduce the amount of virus in the body suppressing  symptoms  Begin prednisone  every morning with food to open and relax the airway, will help with chest pressure, avoid ibuprofen  while taking but may use Tylenol   You may use inhaler taking 2 puffs every 4 hours as needed for chest pressure, wheezing or shortness of breath  You may use cough syrup at bedtime  You can take Tylenol  as needed for fever reduction and pain relief.   For cough: honey 1/2 to 1 teaspoon (you can dilute the honey in water or another fluid).  You can also use guaifenesin and dextromethorphan for cough. You can use a humidifier for chest congestion and cough.  If you don't have a humidifier, you can sit in the bathroom with the hot shower running.      For sore throat: try warm salt water gargles, cepacol lozenges, throat spray, warm tea or water with lemon/honey, popsicles or ice, or OTC cold relief medicine for throat discomfort.   For congestion: take a daily anti-histamine like Zyrtec, Claritin, and a oral decongestant, such as pseudoephedrine.  You can also use Flonase 1-2 sprays in each nostril daily.   It is important to stay hydrated: drink plenty of fluids (water, gatorade/powerade/pedialyte, juices, or teas) to keep your throat moisturized and help further relieve irritation/discomfort.    ED Prescriptions     Medication Sig Dispense Auth. Provider   nirmatrelvir/ritonavir (PAXLOVID, 300/100,) 20 x 150 MG & 10 x 100MG  TBPK Take 3 tablets by mouth 2 (two) times daily for 5 days. Patient GFR is 60. Take nirmatrelvir (150 mg) two tablets twice daily for 5 days and ritonavir (100 mg) one tablet twice daily for 5 days. 30 tablet Shacoya Burkhammer R, NP   predniSONE  (STERAPRED UNI-PAK 21 TAB) 10 MG (21) TBPK tablet Take by mouth daily. Take 6 tabs by mouth daily  for 1 days, then 5 tabs for 1 days, then 4 tabs for 1 days, then 3 tabs for 1 days, 2 tabs for 1 days, then 1 tab by mouth daily for 1 days 21 tablet Lannah Koike R, NP   albuterol (VENTOLIN HFA) 108 (90  Base) MCG/ACT inhaler Inhale 2 puffs into the lungs every 4 (four) hours as needed for wheezing or shortness of breath. 8 g Teresa Price R, NP   promethazine -dextromethorphan (PROMETHAZINE -DM) 6.25-15 MG/5ML syrup Take 5 mLs by mouth at bedtime as needed. 118 mL Evlyn Amason, Price SAUNDERS, NP      PDMP not reviewed this encounter.   Teresa Price SAUNDERS, NP 09/11/24 [phone]

## 2024-09-11 NOTE — ED Triage Notes (Signed)
 Patient complains of cough and chest congestion x 2 days. Patient took OTC medication on yesterday.

## 2024-09-11 NOTE — Discharge Instructions (Signed)
 Covid 19 is a virus and should steadily improve in time it can take up to 7 to 10 days before you truly start to see a turnaround however things will get better    Per the CDC you will need to quarantine and to your 24 hours without fever, if no fever may continue activity wearing mask  Begin antiviral taking twice daily for 5 days to reduce the amount of virus in the body suppressing symptoms  Begin prednisone  every morning with food to open and relax the airway, will help with chest pressure, avoid ibuprofen  while taking but may use Tylenol   You may use inhaler taking 2 puffs every 4 hours as needed for chest pressure, wheezing or shortness of breath  You may use cough syrup at bedtime  You can take Tylenol  as needed for fever reduction and pain relief.   For cough: honey 1/2 to 1 teaspoon (you can dilute the honey in water or another fluid).  You can also use guaifenesin and dextromethorphan for cough. You can use a humidifier for chest congestion and cough.  If you don't have a humidifier, you can sit in the bathroom with the hot shower running.      For sore throat: try warm salt water gargles, cepacol lozenges, throat spray, warm tea or water with lemon/honey, popsicles or ice, or OTC cold relief medicine for throat discomfort.   For congestion: take a daily anti-histamine like Zyrtec, Claritin, and a oral decongestant, such as pseudoephedrine.  You can also use Flonase 1-2 sprays in each nostril daily.   It is important to stay hydrated: drink plenty of fluids (water, gatorade/powerade/pedialyte, juices, or teas) to keep your throat moisturized and help further relieve irritation/discomfort.

## 2024-09-14 ENCOUNTER — Encounter (HOSPITAL_BASED_OUTPATIENT_CLINIC_OR_DEPARTMENT_OTHER): Payer: Self-pay | Admitting: Orthopaedic Surgery

## 2024-09-15 ENCOUNTER — Encounter: Payer: Self-pay | Admitting: Radiology

## 2024-09-17 ENCOUNTER — Encounter: Admitting: Dermatology

## 2024-09-18 ENCOUNTER — Other Ambulatory Visit: Payer: Self-pay | Admitting: Family

## 2024-09-18 ENCOUNTER — Encounter: Payer: Self-pay | Admitting: Family

## 2024-09-18 DIAGNOSIS — R142 Eructation: Secondary | ICD-10-CM

## 2024-09-18 DIAGNOSIS — R1084 Generalized abdominal pain: Secondary | ICD-10-CM

## 2024-09-22 ENCOUNTER — Ambulatory Visit: Admitting: Family

## 2024-09-26 ENCOUNTER — Encounter: Payer: Self-pay | Admitting: Family

## 2024-09-26 DIAGNOSIS — R110 Nausea: Secondary | ICD-10-CM

## 2024-09-26 MED ORDER — ONDANSETRON 8 MG PO TBDP: 8.0000 mg | ORAL_TABLET | Freq: Three times a day (TID) | ORAL | 0 refills | Status: AC | PRN

## 2024-09-26 NOTE — Telephone Encounter (Signed)
 Refill provided

## 2024-09-29 ENCOUNTER — Ambulatory Visit: Admitting: Family

## 2024-10-03 ENCOUNTER — Other Ambulatory Visit: Payer: Self-pay | Admitting: Medical Genetics

## 2024-10-03 NOTE — Telephone Encounter (Signed)
 I spoke with the patient on 11/18 and scheduled her for surgery on 10/30/24.

## 2024-10-11 ENCOUNTER — Other Ambulatory Visit: Payer: Self-pay | Admitting: Family

## 2024-10-11 DIAGNOSIS — F39 Unspecified mood [affective] disorder: Secondary | ICD-10-CM

## 2024-10-11 DIAGNOSIS — F422 Mixed obsessional thoughts and acts: Secondary | ICD-10-CM

## 2024-10-17 ENCOUNTER — Other Ambulatory Visit: Payer: Self-pay | Admitting: Neurology

## 2024-10-20 ENCOUNTER — Ambulatory Visit (HOSPITAL_BASED_OUTPATIENT_CLINIC_OR_DEPARTMENT_OTHER): Admitting: Certified Nurse Midwife

## 2024-10-20 ENCOUNTER — Encounter (HOSPITAL_BASED_OUTPATIENT_CLINIC_OR_DEPARTMENT_OTHER): Payer: Self-pay | Admitting: Certified Nurse Midwife

## 2024-10-20 ENCOUNTER — Other Ambulatory Visit (HOSPITAL_COMMUNITY)
Admission: RE | Admit: 2024-10-20 | Discharge: 2024-10-20 | Disposition: A | Discharge status: Home / Self Care | Source: Ambulatory Visit | Attending: Certified Nurse Midwife | Admitting: Certified Nurse Midwife

## 2024-10-20 VITALS — BP 123/88 | HR 78 | Ht 62.0 in | Wt 178.8 lb

## 2024-10-20 DIAGNOSIS — Z1231 Encounter for screening mammogram for malignant neoplasm of breast: Secondary | ICD-10-CM

## 2024-10-20 DIAGNOSIS — Z01419 Encounter for gynecological examination (general) (routine) without abnormal findings: Secondary | ICD-10-CM

## 2024-10-20 DIAGNOSIS — Z30432 Encounter for removal of intrauterine contraceptive device: Secondary | ICD-10-CM

## 2024-10-20 DIAGNOSIS — Z124 Encounter for screening for malignant neoplasm of cervix: Secondary | ICD-10-CM

## 2024-10-20 NOTE — Progress Notes (Signed)
 43 y.o. G2P1021 Divorced White or Caucasian female here for annual exam.  Pt has mirena  IUD and would like it removed (2016 insertion). She is amenorrheic.   No LMP recorded. (Menstrual status: IUD).          Sexually active: Yes.    The current method of family planning is Pt has IUD currently, desires removal.    Exercising: Yes.     Smoker:  no  Health Maintenance: Pap:  Last pap several years ago History of abnormal Pap:  no MMG:  Ordered    reports that she has never smoked. She has never used smokeless tobacco. She reports current alcohol use. She reports that she does not use drugs.  Past Medical History:  Diagnosis Date   Allergic rhinitis    Anxiety    Asthma    Colitis    Collagenous colitis 02/25/2015   02/2015 colonoscopy   Depression    Heart murmur    past hx of but not now    Hypothyroidism    IBS (irritable bowel syndrome)    Kidney stones    Meningitis    age 55 -   Scarlet fever    Shingles     Past Surgical History:  Procedure Laterality Date   COLONOSCOPY     LITHOTRIPSY     TONSILLECTOMY     WISDOM TOOTH EXTRACTION      Current Outpatient Medications  Medication Sig Dispense Refill   lamoTRIgine  (LAMICTAL ) 25 MG tablet TAKE 1 TABLET BY MOUTH EVERY EVENING 30 tablet 2   levonorgestrel  (MIRENA , 52 MG,) 20 MCG/24HR IUD 1 each by Intrauterine route once.     levothyroxine  (SYNTHROID ) 88 MCG tablet Take 1 tablet (88 mcg total) by mouth daily. 90 tablet 3   venlafaxine  XR (EFFEXOR -XR) 150 MG 24 hr capsule Take 1 capsule (150 mg total) by mouth daily. 90 capsule 3   albuterol  (VENTOLIN  HFA) 108 (90 Base) MCG/ACT inhaler Inhale 2 puffs into the lungs every 4 (four) hours as needed for wheezing or shortness of breath. 8 g 0   aspirin  EC 325 MG tablet Take 1 tablet (325 mg total) by mouth daily. 14 tablet 0   Atogepant  (QULIPTA ) 60 MG TABS Take 1 tablet (60 mg total) by mouth daily. 30 tablet 11   Azelastine -Fluticasone  137-50 MCG/ACT SUSP SPRAY 1 SPRAY  IN EACH NOSTRIL TWICE DAILY 23 g 5   botulinum toxin Type A  (BOTOX ) 200 units injection Provider to inject 155 units into the muscles of the head and neck every 12 weeks. Discard remainder. 1 each 3   cetirizine (ZYRTEC) 10 MG tablet Take 10 mg by mouth daily.     clindamycin  (CLEOCIN -T) 1 % lotion Apply topically daily. 60 mL 5   ondansetron  (ZOFRAN -ODT) 8 MG disintegrating tablet Take 1 tablet (8 mg total) by mouth every 8 (eight) hours as needed for nausea or vomiting. 18 tablet 0   oxyCODONE  (ROXICODONE ) 5 MG immediate release tablet Take 1 tablet (5 mg total) by mouth every 4 (four) hours as needed for severe pain (pain score 7-10) or breakthrough pain. 5 tablet 0   potassium chloride  (KLOR-CON ) 10 MEQ tablet TAKE 1 TABLET BY MOUTH DAILY 30 tablet 0   predniSONE  (STERAPRED UNI-PAK 21 TAB) 10 MG (21) TBPK tablet Take by mouth daily. Take 6 tabs by mouth daily  for 1 days, then 5 tabs for 1 days, then 4 tabs for 1 days, then 3 tabs for 1 days, 2 tabs for 1  days, then 1 tab by mouth daily for 1 days 21 tablet 0   promethazine -dextromethorphan (PROMETHAZINE -DM) 6.25-15 MG/5ML syrup Take 5 mLs by mouth at bedtime as needed. 118 mL 0   tretinoin  (RETIN-A ) 0.025 % cream Apply topically at bedtime. 45 g 4   UBRELVY  100 MG TABS TAKE ONE TABLET BY MOUTH AS NEEDED 8 tablet 0   XIFAXAN  550 MG TABS tablet Take 550 mg by mouth 3 (three) times daily.     Zavegepant HCl (ZAVZPRET ) 10 MG/ACT SOLN Place 1 spray into the nose daily as needed. 8 each 0   No current facility-administered medications for this visit.    Family History  Problem Relation Age of Onset   Hypertension Mother    Hypertension Father    Liver cancer Father    Alcohol abuse Father    Cirrhosis Father    Migraines Father    Uterine cancer Sister    Brain cancer Maternal Uncle    Breast cancer Paternal Aunt    Colon cancer Neg Hx    Esophageal cancer Neg Hx    Rectal cancer Neg Hx     ROS: Constitutional:  negative Genitourinary:negative  Exam:   BP 123/88   Pulse 78   Ht 5' 2 (1.575 m) Comment: Reported  Wt 178 lb 12.8 oz (81.1 kg)   BMI 32.70 kg/m   Height: 5' 2 (157.5 cm) (Reported)  General appearance: alert, cooperative and appears stated age Head: Normocephalic, without obvious abnormality, atraumatic Lungs: clear to auscultation bilaterally Breasts: normal appearance, no masses or tenderness, Inspection negative, No nipple retraction or dimpling, No nipple discharge or bleeding, No axillary or supraclavicular adenopathy, Normal to palpation without dominant masses Heart: regular rate and rhythm Abdomen: soft, non-tender; bowel sounds normal; no masses,  no organomegaly Extremities: extremities normal, atraumatic, no cyanosis or edema Skin: Skin color, texture, turgor normal. No rashes or lesions Lymph nodes: Cervical, supraclavicular, and axillary nodes normal. No abnormal inguinal nodes palpated Neurologic: Grossly normal   Pelvic: External genitalia:  no lesions              Urethra:  normal appearing urethra with no masses, tenderness or lesions              Bartholins and Skenes: normal                 Vagina: normal appearing vagina with normal color and no discharge, no lesions              Cervix: multiparous appearance, no bleeding following Pap, no cervical motion tenderness, and IUD strings visible. IUD removed with ring forcep without difficulty. Pt denied pain or discomfort with procedure.               Pap taken: Yes.   Bimanual Exam:  Uterus:  normal size, contour, position, consistency, mobility, non-tender              Adnexa: no mass, fullness, tenderness               Rectovaginal: Confirms               Anus:  normal sphincter tone, no lesions  Chaperone, CMA, was present for exam.  Assessment/Plan:  1. Encounter for annual routine gynecological examination (Primary) - Annual screening mammograms - Thyroid  Panel With TSH - Vitamin B6 - VITAMIN D  25  Hydroxy (Vit-D Deficiency, Fractures) - CBC - Comp Met (CMET)  2. Cervical cancer screening - Cytology - PAP(  [place])  3. Removal of contraceptive intrauterine device (IUD) performed - IUD removed without difficulty with ring forcep. Discussed/educated pt that she may be fertile. - Pt will consider what method of contraception she desires.  4. Encounter for screening mammogram for malignant neoplasm of breast - MM 3D SCREENING MAMMOGRAM BILATERAL BREAST; Future   RTO 1 year for annual gyn exam and prn if issues arise. Arland MARLA Roller

## 2024-10-22 ENCOUNTER — Ambulatory Visit (HOSPITAL_BASED_OUTPATIENT_CLINIC_OR_DEPARTMENT_OTHER): Payer: Self-pay | Admitting: Certified Nurse Midwife

## 2024-10-22 LAB — CYTOLOGY - PAP
Adequacy: ABSENT
Comment: NEGATIVE
Diagnosis: NEGATIVE
High risk HPV: NEGATIVE

## 2024-10-26 ENCOUNTER — Encounter (HOSPITAL_BASED_OUTPATIENT_CLINIC_OR_DEPARTMENT_OTHER): Payer: Self-pay | Admitting: Orthopaedic Surgery

## 2024-10-26 LAB — COMPREHENSIVE METABOLIC PANEL WITH GFR
ALT: 12 IU/L (ref 0–32)
AST: 19 IU/L (ref 0–40)
Albumin: 4.7 g/dL (ref 3.9–4.9)
Alkaline Phosphatase: 67 IU/L (ref 41–116)
BUN/Creatinine Ratio: 15 (ref 9–23)
BUN: 16 mg/dL (ref 6–24)
Bilirubin Total: 0.3 mg/dL (ref 0.0–1.2)
CO2: 17 mmol/L — ABNORMAL LOW (ref 20–29)
Calcium: 9.6 mg/dL (ref 8.7–10.2)
Chloride: 103 mmol/L (ref 96–106)
Creatinine, Ser: 1.06 mg/dL — ABNORMAL HIGH (ref 0.57–1.00)
Globulin, Total: 2.4 g/dL (ref 1.5–4.5)
Glucose: 88 mg/dL (ref 70–99)
Potassium: 4 mmol/L (ref 3.5–5.2)
Sodium: 139 mmol/L (ref 134–144)
Total Protein: 7.1 g/dL (ref 6.0–8.5)
eGFR: 66 mL/min/1.73 (ref >59)

## 2024-10-26 LAB — THYROID PANEL WITH TSH
Free Thyroxine Index: 1.8 (ref 1.2–4.9)
T3 Uptake Ratio: 25 % (ref 24–39)
T4, Total: 7.2 ug/dL (ref 4.5–12.0)
TSH: 4.96 u[IU]/mL — ABNORMAL HIGH (ref 0.450–4.500)

## 2024-10-26 LAB — VITAMIN B6: Vitamin B6: 9.1 ug/L (ref 3.4–65.2)

## 2024-10-26 LAB — CBC
Hematocrit: 37.9 % (ref 34.0–46.6)
Hemoglobin: 12.5 g/dL (ref 11.1–15.9)
MCH: 30.4 pg (ref 26.6–33.0)
MCHC: 33 g/dL (ref 31.5–35.7)
MCV: 92 fL (ref 79–97)
Platelets: 320 x10E3/uL (ref 150–450)
RBC: 4.11 x10E6/uL (ref 3.77–5.28)
RDW: 13 % (ref 11.7–15.4)
WBC: 7.9 x10E3/uL (ref 3.4–10.8)

## 2024-10-26 LAB — VITAMIN D 25 HYDROXY (VIT D DEFICIENCY, FRACTURES): Vit D, 25-Hydroxy: 19.5 ng/mL — ABNORMAL LOW (ref 30.0–100.0)

## 2024-10-29 ENCOUNTER — Other Ambulatory Visit: Payer: Self-pay | Admitting: Family

## 2024-10-29 DIAGNOSIS — Z1231 Encounter for screening mammogram for malignant neoplasm of breast: Secondary | ICD-10-CM

## 2024-11-03 ENCOUNTER — Inpatient Hospital Stay (HOSPITAL_BASED_OUTPATIENT_CLINIC_OR_DEPARTMENT_OTHER): Admission: RE | Admit: 2024-11-03 | Admitting: Radiology

## 2024-11-03 DIAGNOSIS — Z1231 Encounter for screening mammogram for malignant neoplasm of breast: Secondary | ICD-10-CM

## 2024-11-11 ENCOUNTER — Encounter: Payer: Self-pay | Admitting: Neurology

## 2024-11-11 ENCOUNTER — Ambulatory Visit (INDEPENDENT_AMBULATORY_CARE_PROVIDER_SITE_OTHER): Admitting: Neurology

## 2024-11-11 ENCOUNTER — Other Ambulatory Visit: Payer: Self-pay | Admitting: Nurse Practitioner

## 2024-11-11 VITALS — BP 125/84

## 2024-11-11 DIAGNOSIS — R110 Nausea: Secondary | ICD-10-CM

## 2024-11-11 DIAGNOSIS — G43709 Chronic migraine without aura, not intractable, without status migrainosus: Secondary | ICD-10-CM

## 2024-11-11 MED ORDER — ONABOTULINUMTOXINA 200 UNITS IJ SOLR
155.0000 [IU] | Freq: Once | INTRAMUSCULAR | Status: AC
  Administered 2024-11-11: 155 [IU] via INTRAMUSCULAR

## 2024-11-11 NOTE — Progress Notes (Signed)
" ° ° °  BOTOX  PROCEDURE NOTE FOR MIGRAINE HEADACHE   HISTORY: Alison Clarke is here for Botox . Last Botox  was 08/19/24 with me. With Botox , had several weeks with no headache. Before taking Ubrelvy  1-2 times a week. Since Botox , has only needed 2 Ubrelvy  tablets. Remains on Qulipta . Changing insurance next year. Would like to continue Botox .   Description of procedure:  The patient was placed in a sitting position. The standard protocol was used for Botox  as follows, with 5 units of Botox  injected at each site:   -Procerus muscle, midline injection  -Corrugator muscle, bilateral injection  -Frontalis muscle, bilateral injection, with 2 sites each side, medial injection was performed in the upper one third of the frontalis muscle, in the region vertical from the medial inferior edge of the superior orbital rim. The lateral injection was again in the upper one third of the forehead vertically above the lateral limbus of the cornea, 1.5 cm lateral to the medial injection site.  -Temporalis muscle injection, 4 sites, bilaterally. The first injection was 3 cm above the tragus of the ear, second injection site was 1.5 cm to 3 cm up from the first injection site in line with the tragus of the ear. The third injection site was 1.5-3 cm forward between the first 2 injection sites. The fourth injection site was 1.5 cm posterior to the second injection site.  -Occipitalis muscle injection, 3 sites, bilaterally. The first injection was done one half way between the occipital protuberance and the tip of the mastoid process behind the ear. The second injection site was done lateral and superior to the first, 1 fingerbreadth from the first injection. The third injection site was 1 fingerbreadth superiorly and medially from the first injection site.  -Cervical paraspinal muscle injection, 2 sites, bilateral, the first injection site was 1 cm from the midline of the cervical spine, 3 cm inferior to the lower  border of the occipital protuberance. The second injection site was 1.5 cm superiorly and laterally to the first injection site.  -Trapezius muscle injection was performed at 3 sites, bilaterally. The first injection site was in the upper trapezius muscle halfway between the inflection point of the neck, and the acromion. The second injection site was one half way between the acromion and the first injection site. The third injection was done between the first injection site and the inflection point of the neck.   A 200 unit bottle of Botox  was used, 155 units were injected, the rest of the Botox  was wasted. The patient tolerated the procedure well, there were no complications of the above procedure.  Botox  NDC 9976-6078-97 Lot number [id number] Expiration date 01/2027 SP  We will continue Botox , uses Ubrelvy  PRN. Also on Qulipta . Keep track of migraines to help better determine efficacy of Botox .  "

## 2024-11-11 NOTE — Progress Notes (Signed)
 Botox - 200 units x 1 vial Lot: [id number] Expiration: 01/2027 NDC: 9976-6078-97  Bacteriostatic 0.9% Sodium Chloride - 4 mL  Lot: [id number] Expiration: 02/10/2026 NDC: 9590-8033-97  Dx: H56.290 S/P  Witnessed by Delon Roys, RMA

## 2024-11-12 ENCOUNTER — Encounter: Payer: Self-pay | Admitting: Family

## 2024-11-14 ENCOUNTER — Encounter (HOSPITAL_BASED_OUTPATIENT_CLINIC_OR_DEPARTMENT_OTHER): Admitting: Orthopaedic Surgery

## 2024-11-14 NOTE — Telephone Encounter (Signed)
 Looks like this was filled 12/30

## 2024-11-17 ENCOUNTER — Other Ambulatory Visit (HOSPITAL_COMMUNITY): Payer: Self-pay

## 2024-11-18 ENCOUNTER — Ambulatory Visit: Admitting: Neurology

## 2024-11-18 ENCOUNTER — Telehealth: Payer: Self-pay

## 2024-11-18 ENCOUNTER — Encounter: Payer: Self-pay | Admitting: Family

## 2024-11-18 ENCOUNTER — Encounter: Payer: Self-pay | Admitting: Neurology

## 2024-11-18 ENCOUNTER — Other Ambulatory Visit (HOSPITAL_COMMUNITY): Payer: Self-pay

## 2024-11-18 DIAGNOSIS — J301 Allergic rhinitis due to pollen: Secondary | ICD-10-CM

## 2024-11-18 NOTE — Telephone Encounter (Signed)
 Clinical questions have been answered and PA submitted. PA currently Pending. Please be advised that most companies allow up to 30 days to make a decision. We will advise when a determination has been made, or follow up in 1 week.   Please reach out to our team, Rx Prior Auth Pool, if you haven't heard back in a week.

## 2024-11-18 NOTE — Telephone Encounter (Signed)
 Pharmacy Patient Advocate Encounter   Received notification from Patient Advice Request messages that prior authorization for Qulipta  is required/requested.   Insurance verification completed.   The patient is insured through [place].   Per test claim: PA required; PA submitted to above mentioned insurance via Latent Key/confirmation #/EOC [id number] Status is pending

## 2024-11-18 NOTE — Telephone Encounter (Signed)
 Pharmacy Patient Advocate Encounter   Received notification from Patient Advice Request messages that prior authorization for Ubrelvy  100mg  Tablet is required/requested.   Insurance verification completed.   The patient is insured through [employer].   Per test claim: PA required; PA started via CoverMyMeds. KEY [id number] . Waiting for clinical questions to populate.

## 2024-11-19 ENCOUNTER — Encounter: Payer: Self-pay | Admitting: Family

## 2024-11-19 ENCOUNTER — Other Ambulatory Visit (HOSPITAL_COMMUNITY): Payer: Self-pay

## 2024-11-19 ENCOUNTER — Telehealth: Payer: Self-pay

## 2024-11-19 NOTE — Telephone Encounter (Signed)
 Pharmacy Patient Advocate Encounter   Received notification from Onbase CMM KEY that prior authorization for Azelastine -Fluticasone  137-50 is required/requested.   Insurance verification completed.   The patient is insured through [facility].   Per test claim: Per test claim, medication is not covered due to plan/benefit exclusion, PA not submitted at this time

## 2024-11-21 ENCOUNTER — Other Ambulatory Visit (HOSPITAL_COMMUNITY): Payer: Self-pay

## 2024-11-21 MED ORDER — AZELASTINE HCL 0.1 % NA SOLN: 1.0000 | Freq: Two times a day (BID) | NASAL | 12 refills | Status: AC

## 2024-11-21 MED ORDER — FLUTICASONE PROPIONATE 50 MCG/ACT NA SUSP: 2.0000 | Freq: Every day | NASAL | 6 refills | Status: AC

## 2024-11-21 NOTE — Telephone Encounter (Signed)
 Pharmacy Patient Advocate Encounter  Received notification from [place] that Prior Authorization for Qulipta  has been APPROVED from 11/21/2024 to 05/20/2025. Ran test claim, Copay is $0. This test claim was processed through [facility]- copay amounts may vary at other pharmacies due to pharmacy/plan contracts, or as the patient moves through the different stages of their insurance plan.   PA #/Case ID/Reference #: [id number]

## 2024-11-21 NOTE — Telephone Encounter (Signed)
 Pharmacy Patient Advocate Encounter  Received notification from [employer] that Prior Authorization for Ubrelvy  has been APPROVED from 11/21/2024 to 05/20/2025. Ran test claim, Copay is $0. This test claim was processed through [facility]- copay amounts may vary at other pharmacies due to pharmacy/plan contracts, or as the patient moves through the different stages of their insurance plan.   PA #/Case ID/Reference #: [id number]

## 2024-11-21 NOTE — Addendum Note (Signed)
 Addended by: CORWIN ANTU on: 11/21/2024 03:34 PM   Modules accepted: Orders

## 2024-11-21 NOTE — Telephone Encounter (Signed)
 I spoke with  pt; pt notified as instructed by T DugalFNP. pt said that in the past she has low K when has vivid dreams and night sweats.pt said she is drinking more and feeling better. No dry mouth but having some abd pain. If she starts feeling worse again pt will go to ED for IV fluids.pt will schedule appt with Tabitha thru my chart. UC & ED precautions given and pt voiced understanding. Sending note to T DugalFNP who is out of office and CHRISTELLA Crandall NP who is in office.

## 2024-11-21 NOTE — Telephone Encounter (Signed)
 Please triage accordingly  Needs to be seen to be evaluated before medical advice can be given

## 2024-11-24 NOTE — Telephone Encounter (Signed)
 noted

## 2024-11-25 NOTE — Telephone Encounter (Signed)
 Can you please call and get this information from her (GI information for the referral) I rather she obtains the referral and gets on in April vs not having a GI. I would also advise right now that she does go to her current GI to be evaluated.   I'm ok with you initiating referral.  Just use similar codes from last referral placed.

## 2024-11-26 ENCOUNTER — Other Ambulatory Visit: Payer: Self-pay

## 2024-11-26 ENCOUNTER — Encounter (HOSPITAL_BASED_OUTPATIENT_CLINIC_OR_DEPARTMENT_OTHER): Payer: Self-pay | Admitting: *Deleted

## 2024-11-26 ENCOUNTER — Emergency Department (HOSPITAL_BASED_OUTPATIENT_CLINIC_OR_DEPARTMENT_OTHER): Payer: Self-pay

## 2024-11-26 ENCOUNTER — Emergency Department (HOSPITAL_BASED_OUTPATIENT_CLINIC_OR_DEPARTMENT_OTHER)
Admission: EM | Admit: 2024-11-26 | Discharge: 2024-11-26 | Disposition: A | Discharge status: Home / Self Care | Payer: Self-pay | Attending: Emergency Medicine | Admitting: Emergency Medicine

## 2024-11-26 DIAGNOSIS — R197 Diarrhea, unspecified: Secondary | ICD-10-CM | POA: Insufficient documentation

## 2024-11-26 DIAGNOSIS — Z7982 Long term (current) use of aspirin: Secondary | ICD-10-CM | POA: Insufficient documentation

## 2024-11-26 DIAGNOSIS — N200 Calculus of kidney: Secondary | ICD-10-CM | POA: Diagnosis not present

## 2024-11-26 DIAGNOSIS — R110 Nausea: Secondary | ICD-10-CM | POA: Diagnosis present

## 2024-11-26 LAB — URINALYSIS, ROUTINE W REFLEX MICROSCOPIC
Bilirubin Urine: NEGATIVE
Glucose, UA: NEGATIVE mg/dL
Ketones, ur: NEGATIVE mg/dL
Leukocytes,Ua: NEGATIVE
Nitrite: NEGATIVE
Specific Gravity, Urine: 1.043 — ABNORMAL HIGH (ref 1.005–1.030)
pH: 5 (ref 5.0–8.0)

## 2024-11-26 LAB — CBC
HCT: 39 % (ref 36.0–46.0)
Hemoglobin: 13.4 g/dL (ref 12.0–15.0)
MCH: 30.5 pg (ref 26.0–34.0)
MCHC: 34.4 g/dL (ref 30.0–36.0)
MCV: 88.8 fL (ref 80.0–100.0)
Platelets: 271 K/uL (ref 150–400)
RBC: 4.39 MIL/uL (ref 3.87–5.11)
RDW: 12.2 % (ref 11.5–15.5)
WBC: 6.9 K/uL (ref 4.0–10.5)
nRBC: 0 % (ref 0.0–0.2)

## 2024-11-26 LAB — COMPREHENSIVE METABOLIC PANEL WITH GFR
ALT: 13 U/L (ref 0–44)
AST: 18 U/L (ref 15–41)
Albumin: 4.8 g/dL (ref 3.5–5.0)
Alkaline Phosphatase: 63 U/L (ref 38–126)
Anion gap: 11 (ref 5–15)
BUN: 12 mg/dL (ref 6–20)
CO2: 27 mmol/L (ref 22–32)
Calcium: 9.7 mg/dL (ref 8.9–10.3)
Chloride: 102 mmol/L (ref 98–111)
Creatinine, Ser: 0.71 mg/dL (ref 0.44–1.00)
GFR, Estimated: >60 mL/min
Glucose, Bld: 84 mg/dL (ref 70–99)
Potassium: 3.8 mmol/L (ref 3.5–5.1)
Sodium: 139 mmol/L (ref 135–145)
Total Bilirubin: 0.4 mg/dL (ref 0.0–1.2)
Total Protein: 7.5 g/dL (ref 6.5–8.1)

## 2024-11-26 LAB — LIPASE, BLOOD: Lipase: 33 U/L (ref 11–51)

## 2024-11-26 LAB — PREGNANCY, URINE: Preg Test, Ur: NEGATIVE

## 2024-11-26 MED ORDER — ONDANSETRON HCL 4 MG/2ML IJ SOLN
4.0000 mg | Freq: Once | INTRAMUSCULAR | Status: AC
  Administered 2024-11-26: 4 mg via INTRAVENOUS
  Filled 2024-11-26: qty 2

## 2024-11-26 MED ORDER — KETOROLAC TROMETHAMINE 15 MG/ML IJ SOLN
15.0000 mg | Freq: Once | INTRAMUSCULAR | Status: AC
  Administered 2024-11-26: 15 mg via INTRAVENOUS
  Filled 2024-11-26: qty 1

## 2024-11-26 MED ORDER — ONDANSETRON HCL 4 MG PO TABS: 4.0000 mg | ORAL_TABLET | Freq: Four times a day (QID) | ORAL | 0 refills | Status: DC

## 2024-11-26 MED ORDER — ONDANSETRON 4 MG PO TBDP
4.0000 mg | ORAL_TABLET | Freq: Once | ORAL | Status: AC
  Administered 2024-11-26: 4 mg via ORAL
  Filled 2024-11-26: qty 1

## 2024-11-26 MED ORDER — SODIUM CHLORIDE 0.9 % IV BOLUS
1000.0000 mL | Freq: Once | INTRAVENOUS | Status: AC
  Administered 2024-11-26: 1000 mL via INTRAVENOUS

## 2024-11-26 NOTE — ED Provider Notes (Signed)
 " [place] EMERGENCY DEPARTMENT AT [facility] Provider Note   CSN: [id number] Arrival date & time: 11/26/24  1313     Patient presents with: Diarrhea   Alison Clarke is a 44 y.o. female.  44 year old female presents emergency department for concerns of low potassium.  Patient reports she seen at [facility] for collagenous colitis.  She reports she has already had an endoscopy and colonoscopy and they are trying to get her on infusions if it can get approved by insurance.  She has chronic issues with diarrhea and this episode has been going on since the second of January.  She also has had issues with hypokalemia in the past.  She spoke with her GI doctor and they advised her to go to the emergency department to be evaluated for hypokalemia and get fluids.  Patient is on a potassium supplement at home as well.  Patient also has history of kidney stones and reports having some right flank pain over the last couple days and is unsure if she has a kidney stone currently.     Prior to Admission medications  Medication Sig Start Date End Date Taking? Authorizing Provider  colestipol  (COLESTID ) 1 g tablet Take by mouth. 11/14/24  Yes [provider]  omeprazole  (PRILOSEC) 40 MG capsule Take 40 mg by mouth daily. 11/18/24  Yes [provider]  ondansetron  (ZOFRAN ) 4 MG tablet Take 1 tablet (4 mg total) by mouth every 6 (six) hours. 11/26/24  Yes Myriam Fonda RAMAN, PA-C  albuterol  (VENTOLIN  HFA) 108 (90 Base) MCG/ACT inhaler Inhale 2 puffs into the lungs every 4 (four) hours as needed for wheezing or shortness of breath. 09/11/24   White, Shelba SAUNDERS, NP  aspirin  EC 325 MG tablet Take 1 tablet (325 mg total) by mouth daily. 07/09/24   Genelle Standing, MD  Atogepant  (QULIPTA ) 60 MG TABS Take 1 tablet (60 mg total) by mouth daily. 06/02/24   Ines Onetha NOVAK, MD  azelastine  (ASTELIN ) 0.1 % nasal spray Place 1 spray into both nostrils 2 (two) times daily. Use in each nostril as directed  11/21/24   Corwin Antu, FNP  botulinum toxin Type A  (BOTOX ) 200 units injection Provider to inject 155 units into the muscles of the head and neck every 12 weeks. Discard remainder. 06/12/24   Ines Onetha NOVAK, MD  cetirizine (ZYRTEC) 10 MG tablet Take 10 mg by mouth daily.    [provider]  clindamycin  (CLEOCIN -T) 1 % lotion Apply topically daily. 05/22/24 05/22/25  Paci, Karina M, MD  fluticasone  (FLONASE ) 50 MCG/ACT nasal spray Place 2 sprays into both nostrils daily. 11/21/24   Corwin Antu, FNP  lamoTRIgine  (LAMICTAL ) 25 MG tablet TAKE 1 TABLET BY MOUTH EVERY EVENING 10/13/24   Corwin Antu, FNP  levonorgestrel  (MIRENA , 52 MG,) 20 MCG/24HR IUD 1 each by Intrauterine route once.    [provider]  levothyroxine  (SYNTHROID ) 88 MCG tablet Take 1 tablet (88 mcg total) by mouth daily. 02/11/24   Dugal, Tabitha, FNP  ondansetron  (ZOFRAN -ODT) 8 MG disintegrating tablet Take 1 tablet (8 mg total) by mouth every 8 (eight) hours as needed for nausea or vomiting. 09/26/24   Wendee Lynwood HERO, NP  oxyCODONE  (ROXICODONE ) 5 MG immediate release tablet Take 1 tablet (5 mg total) by mouth every 4 (four) hours as needed for severe pain (pain score 7-10) or breakthrough pain. Patient not taking: Reported on 11/11/2024 07/09/24   Genelle Standing, MD  potassium chloride  (KLOR-CON ) 10 MEQ tablet TAKE 1 TABLET  BY MOUTH DAILY 06/30/24   Dugal, Tabitha, FNP  predniSONE  (STERAPRED UNI-PAK 21 TAB) 10 MG (21) TBPK tablet Take by mouth daily. Take 6 tabs by mouth daily  for 1 days, then 5 tabs for 1 days, then 4 tabs for 1 days, then 3 tabs for 1 days, 2 tabs for 1 days, then 1 tab by mouth daily for 1 days Patient not taking: Reported on 11/11/2024 09/11/24   Teresa Shelba SAUNDERS, NP  promethazine -dextromethorphan (PROMETHAZINE -DM) 6.25-15 MG/5ML syrup Take 5 mLs by mouth at bedtime as needed. 09/11/24   White, Shelba SAUNDERS, NP  tretinoin  (RETIN-A ) 0.025 % cream Apply topically at bedtime. 05/22/24 05/22/25  Paci,  Karina M, MD  UBRELVY  100 MG TABS TAKE ONE TABLET BY MOUTH AS NEEDED 10/20/24   Gayland Lauraine PARAS, NP  venlafaxine  XR (EFFEXOR -XR) 150 MG 24 hr capsule Take 1 capsule (150 mg total) by mouth daily. 01/08/24   Corwin Antu, FNP  XIFAXAN  550 MG TABS tablet Take 550 mg by mouth 3 (three) times daily. 04/18/24   [provider]  Zavegepant HCl (ZAVZPRET ) 10 MG/ACT SOLN Place 1 spray into the nose daily as needed. 06/02/24   Ines Onetha NOVAK, MD    Allergies: Sulfur, Doxycycline, Elemental sulfur, Hydrocodone-acetaminophen , Morphine  and codeine, Vicodin [hydrocodone-acetaminophen ], and Compazine  [prochlorperazine ]    Review of Systems  Gastrointestinal:  Positive for diarrhea and nausea.  Genitourinary:  Positive for flank pain.  All other systems reviewed and are negative.   Updated Vital Signs BP 121/85 (BP Location: Right Arm)   Pulse 75   Temp 98.3 F (36.8 C) (Oral)   Resp 16   SpO2 100%   Physical Exam Vitals and nursing note reviewed.  Constitutional:      General: She is not in acute distress.    Appearance: Normal appearance. She is not ill-appearing.  HENT:     Head: Normocephalic and atraumatic.     Nose: Nose normal.  Eyes:     Extraocular Movements: Extraocular movements intact.     Conjunctiva/sclera: Conjunctivae normal.     Pupils: Pupils are equal, round, and reactive to light.  Cardiovascular:     Rate and Rhythm: Normal rate.  Pulmonary:     Effort: Pulmonary effort is normal. No respiratory distress.     Breath sounds: Normal breath sounds.  Abdominal:     General: Abdomen is flat. There is no distension.     Palpations: Abdomen is soft.     Tenderness: There is no abdominal tenderness. There is no right CVA tenderness, left CVA tenderness or guarding.     Comments: Bowel sounds are normal.  Musculoskeletal:        General: Normal range of motion.     Cervical back: Normal range of motion.  Neurological:     General: No focal deficit present.      Mental Status: She is alert.  Psychiatric:        Mood and Affect: Mood normal.        Behavior: Behavior normal.     (all labs ordered are listed, but only abnormal results are displayed) Labs Reviewed  URINALYSIS, ROUTINE W REFLEX MICROSCOPIC - Abnormal; Notable for the following components:      Result Value   Specific Gravity, Urine 1.043 (*)    Hgb urine dipstick MODERATE (*)    Protein, ur TRACE (*)    Bacteria, UA RARE (*)    All other components within normal limits  LIPASE, BLOOD  COMPREHENSIVE METABOLIC  PANEL WITH GFR  CBC  PREGNANCY, URINE    EKG: None  Radiology: CT Renal Stone Study Result Date: 11/26/2024 CLINICAL DATA:  Intermittent diarrhea 1-2 weeks with abdominal pain. EXAM: CT ABDOMEN AND PELVIS WITHOUT CONTRAST TECHNIQUE: Multidetector CT imaging of the abdomen and pelvis was performed following the standard protocol without IV contrast. RADIATION DOSE REDUCTION: This exam was performed according to the departmental dose-optimization program which includes automated exposure control, adjustment of the mA and/or kV according to patient size and/or use of iterative reconstruction technique. COMPARISON:  11/25/2023 FINDINGS: Lower chest: No acute findings. Hepatobiliary: Gallbladder is contracted. Liver and biliary tree are normal. Pancreas: Normal. Spleen: Normal. Adrenals/Urinary Tract: Adrenal glands are normal. Kidneys are normal in size. No right renal stones. Two stones over the lower pole left kidney with the larger measuring 6 mm. These are unchanged. No hydronephrosis. Ureters and bladder are normal. Stomach/Bowel: Stomach and small bowel are normal. Appendix is normal. Colon is normal. Vascular/Lymphatic: Abdominal aorta is normal in caliber. Remaining vascular structures are unremarkable on this noncontrast exam. No evidence of adenopathy. Reproductive: Uterus and bilateral adnexa are unremarkable. Other: No free fluid or focal inflammatory change.  Musculoskeletal: No focal abnormality. IMPRESSION: 1. No acute findings in the abdomen/pelvis. 2. Nonobstructing left renal stones. Electronically Signed   By: Toribio Agreste M.D.   On: 11/26/2024 16:45     Procedures   Medications Ordered in the ED  ondansetron  (ZOFRAN -ODT) disintegrating tablet 4 mg (4 mg Oral Given 11/26/24 1540)  sodium chloride  0.9 % bolus 1,000 mL (0 mLs Intravenous Stopped 11/26/24 1756)  ketorolac  (TORADOL ) 15 MG/ML injection 15 mg (15 mg Intravenous Given 11/26/24 1641)  ondansetron  (ZOFRAN ) injection 4 mg (4 mg Intravenous Given 11/26/24 1804)   44 y.o. female presents to the ED with complaints of chronic diarrhea and suspected hypokalemia, The differential diagnosis includes electrolyte abnormality, kidney stone, UTI, dehydration, nephritis, pancreatitis, (Ddx)  On arrival pt is nontoxic, vitals unremarkable. Exam is overall unremarkable.  Additional history obtained from chart review significant for patient being seen by neurology for chronic migraines.  Patient has started using Botox  with reported relief.  I ordered medication Toradol  Zofran  for Zofran  headache  Lab Tests:  CMP CBC UA lipase ordered in triage no concerning findings.  Imaging Studies ordered:  I ordered imaging studies which included CT renal which was negative for obstructing stone, but did note stone in the kidneys which patient was aware of.  ED Course:   Patient sitting comfortably in ED bed in no acute distress nontoxic-appearing on initial exam.  Exam is overall benign.  Potassium is nonconcerning.  Patient is pending infusions for treatment of chronic diarrhea and collagenous colitis.  Patient is only presenting for fluid and potassium check.  Will obtain CT scan because patient has had some right flank pain with known history of kidney stones.  CT renal was unremarkable.  Patient is feeling better after fluids Zofran  and Toradol .  Patient was advised to follow-up with GI for further  management and evaluation.  Patient agreed to follow-up with her.  Patient was given strict return precautions and was comfortable discharge at this time.   Portions of this note were generated with Scientist, clinical (histocompatibility and immunogenetics). Dictation errors may occur despite best attempts at proofreading.    Final diagnoses:  Nausea  Diarrhea, unspecified type    ED Discharge Orders          Ordered    ondansetron  (ZOFRAN ) 4 MG tablet  Every 6 hours  11/26/24 1802               Myriam Fonda RAMAN, PA-C 11/26/24 1829    Pamella Ozell LABOR, DO 12/02/24 0005  "

## 2024-11-26 NOTE — Discharge Instructions (Addendum)
 Imaging lab work and exam are reassuring today.  I have prescribed you Zofran  for nausea.  Please try to drink as much fluid as possible to stay up on hydration and follow-up with your GI doctor for further recommendation and treatment.  If you experience any concerning new or worsening symptoms please return to emergency department for further evaluation.

## 2024-11-26 NOTE — ED Triage Notes (Signed)
 Pt states that she has been having diarrhea intermittently since around [facility].  Pt has hx of same.  No abdominal pain. She feels that her potassium is low.

## 2024-11-26 NOTE — ED Notes (Signed)
 Pt d/c instructions, medications, and follow-up care reviewed with pt. Pt verbalized understanding and had no further questions at time of d/c. Pt CA&Ox4, ambulatory, and in NAD at time of d/c

## 2024-12-01 ENCOUNTER — Telehealth: Payer: Self-pay | Admitting: Neurology

## 2024-12-01 NOTE — Telephone Encounter (Signed)
 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 I submitted PA via CMM, status is pending. Key: [id number]

## 2024-12-02 ENCOUNTER — Encounter (HOSPITAL_BASED_OUTPATIENT_CLINIC_OR_DEPARTMENT_OTHER): Payer: Self-pay

## 2024-12-02 ENCOUNTER — Emergency Department (HOSPITAL_BASED_OUTPATIENT_CLINIC_OR_DEPARTMENT_OTHER)
Admission: EM | Admit: 2024-12-02 | Discharge: 2024-12-02 | Disposition: A | Discharge status: Home / Self Care | Attending: Emergency Medicine | Admitting: Emergency Medicine

## 2024-12-02 ENCOUNTER — Other Ambulatory Visit: Payer: Self-pay

## 2024-12-02 ENCOUNTER — Encounter: Payer: Self-pay | Admitting: Family

## 2024-12-02 ENCOUNTER — Ambulatory Visit (INDEPENDENT_AMBULATORY_CARE_PROVIDER_SITE_OTHER): Payer: Self-pay | Admitting: Family

## 2024-12-02 VITALS — BP 110/74 | HR 68 | Temp 98.3°F | Ht 61.0 in | Wt 182.8 lb

## 2024-12-02 DIAGNOSIS — E538 Deficiency of other specified B group vitamins: Secondary | ICD-10-CM

## 2024-12-02 DIAGNOSIS — E876 Hypokalemia: Secondary | ICD-10-CM | POA: Diagnosis not present

## 2024-12-02 DIAGNOSIS — Z7982 Long term (current) use of aspirin: Secondary | ICD-10-CM | POA: Insufficient documentation

## 2024-12-02 DIAGNOSIS — E559 Vitamin D deficiency, unspecified: Secondary | ICD-10-CM

## 2024-12-02 DIAGNOSIS — E039 Hypothyroidism, unspecified: Secondary | ICD-10-CM

## 2024-12-02 DIAGNOSIS — R110 Nausea: Secondary | ICD-10-CM | POA: Diagnosis present

## 2024-12-02 LAB — CBC
HCT: 39 % (ref 36.0–46.0)
Hemoglobin: 13.5 g/dL (ref 12.0–15.0)
MCH: 31.2 pg (ref 26.0–34.0)
MCHC: 34.6 g/dL (ref 30.0–36.0)
MCV: 90.1 fL (ref 80.0–100.0)
Platelets: 292 K/uL (ref 150–400)
RBC: 4.33 MIL/uL (ref 3.87–5.11)
RDW: 12.4 % (ref 11.5–15.5)
WBC: 7.9 K/uL (ref 4.0–10.5)
nRBC: 0 % (ref 0.0–0.2)

## 2024-12-02 LAB — COMPREHENSIVE METABOLIC PANEL WITH GFR
ALT: 16 U/L (ref 0–44)
AST: 20 U/L (ref 15–41)
Albumin: 4.5 g/dL (ref 3.5–5.0)
Alkaline Phosphatase: 57 U/L (ref 38–126)
Anion gap: 9 (ref 5–15)
BUN: 11 mg/dL (ref 6–20)
CO2: 29 mmol/L (ref 22–32)
Calcium: 9.5 mg/dL (ref 8.9–10.3)
Chloride: 102 mmol/L (ref 98–111)
Creatinine, Ser: 0.68 mg/dL (ref 0.44–1.00)
GFR, Estimated: >60 mL/min
Glucose, Bld: 99 mg/dL (ref 70–99)
Potassium: 3.7 mmol/L (ref 3.5–5.1)
Sodium: 140 mmol/L (ref 135–145)
Total Bilirubin: 0.4 mg/dL (ref 0.0–1.2)
Total Protein: 7.2 g/dL (ref 6.5–8.1)

## 2024-12-02 LAB — URINALYSIS, ROUTINE W REFLEX MICROSCOPIC
Bilirubin Urine: NEGATIVE
Glucose, UA: NEGATIVE mg/dL
Hgb urine dipstick: NEGATIVE
Ketones, ur: NEGATIVE mg/dL
Leukocytes,Ua: NEGATIVE
Nitrite: NEGATIVE
Specific Gravity, Urine: 1.025 (ref 1.005–1.030)
pH: 8.5 — ABNORMAL HIGH (ref 5.0–8.0)

## 2024-12-02 LAB — PREGNANCY, URINE: Preg Test, Ur: NEGATIVE

## 2024-12-02 LAB — LIPASE, BLOOD: Lipase: 35 U/L (ref 11–51)

## 2024-12-02 MED ORDER — DIPHENHYDRAMINE HCL 50 MG/ML IJ SOLN
25.0000 mg | Freq: Once | INTRAMUSCULAR | Status: AC
  Administered 2024-12-02: 25 mg via INTRAVENOUS
  Filled 2024-12-02: qty 1

## 2024-12-02 MED ORDER — LEVOTHYROXINE SODIUM 100 MCG PO TABS: 100.0000 ug | ORAL_TABLET | Freq: Every day | ORAL | 1 refills | Status: AC

## 2024-12-02 MED ORDER — SODIUM CHLORIDE 0.9 % IV BOLUS
1000.0000 mL | Freq: Once | INTRAVENOUS | Status: AC
  Administered 2024-12-02: 1000 mL via INTRAVENOUS

## 2024-12-02 MED ORDER — KETOROLAC TROMETHAMINE 15 MG/ML IJ SOLN
15.0000 mg | Freq: Once | INTRAMUSCULAR | Status: AC
  Administered 2024-12-02: 15 mg via INTRAVENOUS
  Filled 2024-12-02: qty 1

## 2024-12-02 MED ORDER — PROMETHAZINE HCL 25 MG/ML IJ SOLN
INTRAMUSCULAR | Status: AC
  Filled 2024-12-02: qty 1

## 2024-12-02 MED ORDER — SODIUM CHLORIDE 0.9 % IV SOLN
12.5000 mg | INTRAVENOUS | Status: AC
  Administered 2024-12-02: 12.5 mg via INTRAVENOUS
  Filled 2024-12-02: qty 0.5

## 2024-12-02 NOTE — ED Triage Notes (Signed)
 Presents to ED with c/o nausea. Pt concerned for colitis flare up and dehydration, causing the nausea. Took 8mg  zofran  PTA without relief.

## 2024-12-02 NOTE — Discharge Instructions (Signed)
 Please you do your best to stay well-hydrated with primarily water alternating with Gatorade or electrolyte drink.  Try bland foods like soup broth, applesauce, toast and rice.  Please follow-up closely with your GI doctor.  Return to the ED with new or worsening symptoms.

## 2024-12-02 NOTE — Patient Instructions (Signed)
" ° °  Start b12 1000 mcg once daily  Start vitamin D3 2000 I/U once daily   Increase levothyroxine  100 mcg once daily  Repeat thyroid  levels in four weeks   "

## 2024-12-02 NOTE — Progress Notes (Signed)
 "  Established Patient Office Visit  Subjective:      CC:  Chief Complaint  Patient presents with   Follow-up    ED follow up    HPI: Alison Clarke is a 44 y.o. female presenting on 12/02/2024 for Follow-up (ED follow up) .  Discussed the use of AI scribe software for clinical note transcription with the patient, who gave verbal consent to proceed.  History of Present Illness Alison Clarke is a 44 year old female with colitis who presents with persistent diarrhea and dehydration.  She has a long-standing history of colitis, diagnosed in 2018, with persistent symptoms of diarrhea and dehydration. Since April, following a severe illness during a work trip in [place] , she has experienced an exacerbation of symptoms. She struggles to maintain hydration, often requiring emergency room visits for IV fluids and potassium supplementation.  She experiences diarrhea up to ten times a day, which worsens with certain foods. Despite dietary modifications, including cutting out dairy, preservatives, and enriched flour, she continues to have frequent bowel movements. She attempts to manage her potassium levels with oral supplements and dietary sources like orange juice, potatoes, and bananas, but reports poor absorption, as evidenced by the appearance of undigested supplements in her stool.  She uses Zofran  for nausea, taking 4 mg doses twice daily, and experiences nausea and headaches as ongoing issues. She also experiences episodes of feeling very hot, which she associates with her dehydration. She has tried various hydration solutions, including 'drip drop', to manage her symptoms.  She has a history of using phentermine intermittently to manage her weight and diarrhea, noting that it is the only medication that reduces her bowel movements.          Social history:  Relevant past medical, surgical, family and social history reviewed and updated as indicated. Interim medical history since  our last visit reviewed.  Allergies and medications reviewed and updated.  DATA REVIEWED: CHART IN EPIC     ROS: Negative unless specifically indicated above in HPI.   Current Medications[1]        Objective:        BP 110/74 (BP Location: Left Arm, Patient Position: Sitting, Cuff Size: Large)   Pulse 68   Temp 98.3 F (36.8 C) (Temporal)   Ht 5' 1 (1.549 m)   Wt 182 lb 12.8 oz (82.9 kg)   SpO2 98%   BMI 34.54 kg/m   Physical Exam   Wt Readings from Last 3 Encounters:  12/02/24 182 lb 12.8 oz (82.9 kg)  10/20/24 178 lb 12.8 oz (81.1 kg)  08/18/24 170 lb (77.1 kg)    Physical Exam Vitals reviewed.  Constitutional:      General: She is not in acute distress.    Appearance: Normal appearance. She is normal weight. She is not ill-appearing, toxic-appearing or diaphoretic.  HENT:     Head: Normocephalic.  Cardiovascular:     Rate and Rhythm: Normal rate and regular rhythm.  Pulmonary:     Effort: Pulmonary effort is normal.  Musculoskeletal:        General: Normal range of motion.  Neurological:     General: No focal deficit present.     Mental Status: She is alert and oriented to person, place, and time. Mental status is at baseline.  Psychiatric:        Mood and Affect: Mood normal.        Behavior: Behavior normal.        Thought Content: Thought content  normal.        Judgment: Judgment normal.          Results Labs Potassium (11/26/2024): 3.9 B12 (05/2024): Within normal limits  Radiology CT abdomen and pelvis (11/26/2024): No acute intra-abdominal or pelvic findings. Non-obstructing left renal calculi, largest measuring 6 mm in the lower pole, unchanged. No hydronephrosis. Kidneys normal.  Assessment & Plan:   Assessment and Plan Assessment & Plan Collagenous colitis with chronic diarrhea and dehydration Chronic diarrhea and dehydration secondary to collagenous colitis, exacerbated since April. Frequent diarrhea episodes, up to ten  times daily, with dehydration and hypokalemia. Budesonide  causes adverse effects, including feeling bloated and weight gain. Phentermine previously effective in reducing diarrhea frequency but not recommended due to potential cardiovascular risks when combined with ondansetron . Awaiting insurance approval for infusions. - Continue bland diet and hydration strategies. - Await insurance approval for infusions. - Avoid phentermine due to cardiovascular risks.  Acquired hypothyroidism Current levothyroxine  dose may be suboptimal, contributing to weight issues and fatigue. - Increased levothyroxine  to 100 mcg daily. - Ordered repeat thyroid  function tests in four weeks.  Vitamin D  deficiency Vitamin D  levels are low. Oral supplementation is preferred over injectable forms. - Start vitamin D3 2000 IU daily.  Low serum vitamin B12 B12 levels were normal in July. - Start B12 1000 mcg daily.  Hypokalemia Potassium levels were 3.9 six days ago. Self-supplementation with potassium tablets. Dehydration and diarrhea contribute to electrolyte imbalance. - Ordered repeat potassium level in four weeks.  Nephrolithiasis, left kidney Non-obstructing left renal stones with the largest measuring 6 mm. No hydronephrosis or urinary symptoms. Avoidance of topiramate due to increased risk of kidney stones. - Continue current management without topiramate.        Return in about 6 months (around 06/01/2025) for f/u CPE.     Ginger Patrick, MSN, APRN, FNP-C  [facility]        [1]  Current Outpatient Medications:    albuterol  (VENTOLIN  HFA) 108 (90 Base) MCG/ACT inhaler, Inhale 2 puffs into the lungs every 4 (four) hours as needed for wheezing or shortness of breath., Disp: 8 g, Rfl: 0   aspirin  EC 325 MG tablet, Take 1 tablet (325 mg total) by mouth daily., Disp: 14 tablet, Rfl: 0   Atogepant  (QULIPTA ) 60 MG TABS, Take 1 tablet (60 mg total) by mouth daily., Disp: 30  tablet, Rfl: 11   azelastine  (ASTELIN ) 0.1 % nasal spray, Place 1 spray into both nostrils 2 (two) times daily. Use in each nostril as directed, Disp: 30 mL, Rfl: 12   botulinum toxin Type A  (BOTOX ) 200 units injection, Provider to inject 155 units into the muscles of the head and neck every 12 weeks. Discard remainder., Disp: 1 each, Rfl: 3   clindamycin  (CLEOCIN -T) 1 % lotion, Apply topically daily., Disp: 60 mL, Rfl: 5   fluticasone  (FLONASE ) 50 MCG/ACT nasal spray, Place 2 sprays into both nostrils daily., Disp: 16 g, Rfl: 6   lamoTRIgine  (LAMICTAL ) 25 MG tablet, TAKE 1 TABLET BY MOUTH EVERY EVENING, Disp: 30 tablet, Rfl: 2   levothyroxine  (SYNTHROID ) 100 MCG tablet, Take 1 tablet (100 mcg total) by mouth daily., Disp: 30 tablet, Rfl: 1   omeprazole  (PRILOSEC) 40 MG capsule, Take 40 mg by mouth daily., Disp: , Rfl:    ondansetron  (ZOFRAN -ODT) 8 MG disintegrating tablet, Take 1 tablet (8 mg total) by mouth every 8 (eight) hours as needed for nausea or vomiting., Disp: 18 tablet, Rfl: 0   tretinoin  (RETIN-A )  0.025 % cream, Apply topically at bedtime., Disp: 45 g, Rfl: 4   UBRELVY  100 MG TABS, TAKE ONE TABLET BY MOUTH AS NEEDED, Disp: 8 tablet, Rfl: 0   venlafaxine  XR (EFFEXOR -XR) 150 MG 24 hr capsule, Take 1 capsule (150 mg total) by mouth daily., Disp: 90 capsule, Rfl: 3   Zavegepant HCl (ZAVZPRET ) 10 MG/ACT SOLN, Place 1 spray into the nose daily as needed., Disp: 8 each, Rfl: 0  "

## 2024-12-02 NOTE — ED Provider Notes (Signed)
 " [place] EMERGENCY DEPARTMENT AT [facility] Provider Note   CSN: [id number] Arrival date & time: 12/02/24  1555     Patient presents with: Nausea   Alison Clarke is a 44 y.o. female.  Patient with past medical history of collagenous colitis presents to emergency room with complaint of nausea.  She reports that she has chronic nausea and vomiting as well as chronic abdominal pain.  She reports that over the past few days she has felt dehydrated and feels like she is not become properly hydrated with eating or drinking water at home.  She has been tolerating oral intake and otherwise having normal bowel movements.  She denies any fever, urinary symptoms chest pain shortness of breath or cough.   HPI     Prior to Admission medications  Medication Sig Start Date End Date Taking? Authorizing Provider  albuterol  (VENTOLIN  HFA) 108 (90 Base) MCG/ACT inhaler Inhale 2 puffs into the lungs every 4 (four) hours as needed for wheezing or shortness of breath. 09/11/24   Teresa Shelba SAUNDERS, NP  aspirin  EC 325 MG tablet Take 1 tablet (325 mg total) by mouth daily. 07/09/24   Genelle Standing, MD  Atogepant  (QULIPTA ) 60 MG TABS Take 1 tablet (60 mg total) by mouth daily. 06/02/24   Ines Onetha NOVAK, MD  azelastine  (ASTELIN ) 0.1 % nasal spray Place 1 spray into both nostrils 2 (two) times daily. Use in each nostril as directed 11/21/24   Corwin Antu, FNP  botulinum toxin Type A  (BOTOX ) 200 units injection Provider to inject 155 units into the muscles of the head and neck every 12 weeks. Discard remainder. 06/12/24   Ines Onetha NOVAK, MD  clindamycin  (CLEOCIN -T) 1 % lotion Apply topically daily. 05/22/24 05/22/25  Paci, Karina M, MD  fluticasone  (FLONASE ) 50 MCG/ACT nasal spray Place 2 sprays into both nostrils daily. 11/21/24   Corwin Antu, FNP  lamoTRIgine  (LAMICTAL ) 25 MG tablet TAKE 1 TABLET BY MOUTH EVERY EVENING 10/13/24   Corwin Antu, FNP  levothyroxine  (SYNTHROID ) 100 MCG tablet Take 1  tablet (100 mcg total) by mouth daily. 12/02/24   Corwin Antu, FNP  omeprazole  (PRILOSEC) 40 MG capsule Take 40 mg by mouth daily. 11/18/24   [provider]  ondansetron  (ZOFRAN -ODT) 8 MG disintegrating tablet Take 1 tablet (8 mg total) by mouth every 8 (eight) hours as needed for nausea or vomiting. 09/26/24   Wendee Lynwood HERO, NP  tretinoin  (RETIN-A ) 0.025 % cream Apply topically at bedtime. 05/22/24 05/22/25  Paci, Karina M, MD  UBRELVY  100 MG TABS TAKE ONE TABLET BY MOUTH AS NEEDED 10/20/24   Gayland Lauraine PARAS, NP  venlafaxine  XR (EFFEXOR -XR) 150 MG 24 hr capsule Take 1 capsule (150 mg total) by mouth daily. 01/08/24   Dugal, Tabitha, FNP  Zavegepant HCl (ZAVZPRET ) 10 MG/ACT SOLN Place 1 spray into the nose daily as needed. 06/02/24   Ines Onetha NOVAK, MD    Allergies: Sulfur, Doxycycline, Elemental sulfur, Hydrocodone-acetaminophen , Morphine  and codeine, Vicodin [hydrocodone-acetaminophen ], and Compazine  [prochlorperazine ]    Review of Systems  Gastrointestinal:  Positive for nausea.    Updated Vital Signs BP (!) 124/90   Pulse 78   Temp 98.1 F (36.7 C) (Temporal)   Resp 16   Ht 5' 1 (1.549 m)   Wt 81.6 kg   SpO2 100%   BMI 34.01 kg/m   Physical Exam Vitals and nursing note reviewed.  Constitutional:      General: She is not in acute distress.    Appearance:  She is not toxic-appearing.  HENT:     Head: Normocephalic and atraumatic.  Eyes:     General: No scleral icterus.    Conjunctiva/sclera: Conjunctivae normal.  Cardiovascular:     Rate and Rhythm: Normal rate and regular rhythm.     Pulses: Normal pulses.     Heart sounds: Normal heart sounds.  Pulmonary:     Effort: Pulmonary effort is normal. No respiratory distress.     Breath sounds: Normal breath sounds.  Abdominal:     General: Abdomen is flat. Bowel sounds are normal.     Palpations: Abdomen is soft.     Tenderness: There is no abdominal tenderness.     Comments: No focal abdominal tenderness.   Skin:    General: Skin is warm and dry.     Findings: No lesion.  Neurological:     General: No focal deficit present.     Mental Status: She is alert and oriented to person, place, and time. Mental status is at baseline.     (all labs ordered are listed, but only abnormal results are displayed) Labs Reviewed  URINALYSIS, ROUTINE W REFLEX MICROSCOPIC - Abnormal; Notable for the following components:      Result Value   pH 8.5 (*)    Protein, ur TRACE (*)    All other components within normal limits  LIPASE, BLOOD  COMPREHENSIVE METABOLIC PANEL WITH GFR  CBC  PREGNANCY, URINE    EKG: None  Radiology: No results found.   Procedures   Medications Ordered in the ED  promethazine  (PHENERGAN ) 25 MG/ML injection (  Not Given 12/02/24 1855)  promethazine  (PHENERGAN ) 12.5 mg in sodium chloride  0.9 % 50 mL IVPB (12.5 mg Intravenous New Bag/Given 12/02/24 1848)  ketorolac  (TORADOL ) 15 MG/ML injection 15 mg (15 mg Intravenous Given 12/02/24 1849)  sodium chloride  0.9 % bolus 1,000 mL (1,000 mLs Intravenous New Bag/Given 12/02/24 1846)                                    Medical Decision Making Amount and/or Complexity of Data Reviewed Labs: ordered.  Risk Prescription drug management.   This patient presents to the ED for concern of N, this involves an extensive number of treatment options, and is a complaint that carries with it a high risk of complications and morbidity.  The differential diagnosis includes cholecystitis, AAA, appendicitis, renal stone, UTI, SBO   Co morbidities that complicate the patient evaluation  Collagenous colitis   Additional history obtained:  Additional history obtained from patient was seen by her primary care doctor today and recommended to continue antiemetics at home and follow-up with GI   Lab Tests:  I personally interpreted labs.  The pertinent results include:   Lab work including CBC, CMP, lipase and urine pregnancy test are  unremarkable.  UA is negative   Imaging Studies ordered:  Discussed however patient is tolerating oral intake and has no focal area of abdominal tenderness thus deferred at this time.    Problem List / ED Course / Critical interventions / Medication management  Patient presents emergency room with complaint of worsening of her chronic nausea.  She reports that she is feeling dehydrated.  She denies any severe abdominal pain, vomiting or diarrhea with this.  On arrival hemodynamically stable and well-appearing.  She has no focal area of abdominal tenderness.  Her lab work is overall reassuring.  She was  given nausea medicine as well as fluids here and reports she is feeling better.  She does have appropriate follow-up with her GI team. Reevaluation of the patient after these medicines showed that the patient improved I have reviewed the patients home medicines and have made adjustments as needed. Patient tolerates oral intake and is feeling better after treatment here.  Feel stable for discharge with outpatient follow-up.  Given return precautions      Final diagnoses:  Nausea    ED Discharge Orders     None          Shermon, Warren SAILOR, PA-C 12/02/24 1918    Ellouise Richerd POUR, DO 12/02/24 2320  "

## 2024-12-04 NOTE — Telephone Encounter (Signed)
 [employer] denied Botox , stating they do not cover Botox  being used at the same time as CGRP. Please let me know how to proceed, I can appeal but not sure if they will overturn the decision if their policy doesn't allow both at the same time.

## 2024-12-10 NOTE — Telephone Encounter (Signed)
 Pt is choosing to remain on Botox  and stop Qulipta . I called [employer] and asked them to terminate the PA on file for Qulipta  and submitted an appeal for Botox  asking them to approve now that she isn't going to be on Qulipta .

## 2024-12-30 ENCOUNTER — Other Ambulatory Visit

## 2025-01-06 ENCOUNTER — Ambulatory Visit: Admitting: Neurology

## 2025-02-04 ENCOUNTER — Ambulatory Visit: Admitting: Neurology
